# Patient Record
Sex: Female | Born: 1960
Health system: Southern US, Community
[De-identification: ages and names within clinical notes are randomized; demographics above are authoritative.]

## PROBLEM LIST (undated history)

## (undated) DIAGNOSIS — K2970 Gastritis, unspecified, without bleeding: Secondary | ICD-10-CM

## (undated) DIAGNOSIS — D649 Anemia, unspecified: Secondary | ICD-10-CM

## (undated) DIAGNOSIS — M1990 Unspecified osteoarthritis, unspecified site: Secondary | ICD-10-CM

## (undated) DIAGNOSIS — I82409 Acute embolism and thrombosis of unspecified deep veins of unspecified lower extremity: Secondary | ICD-10-CM

## (undated) DIAGNOSIS — T8859XA Other complications of anesthesia, initial encounter: Secondary | ICD-10-CM

## (undated) DIAGNOSIS — Z9889 Other specified postprocedural states: Secondary | ICD-10-CM

## (undated) DIAGNOSIS — I10 Essential (primary) hypertension: Secondary | ICD-10-CM

## (undated) DIAGNOSIS — K219 Gastro-esophageal reflux disease without esophagitis: Secondary | ICD-10-CM

## (undated) DIAGNOSIS — R112 Nausea with vomiting, unspecified: Secondary | ICD-10-CM

## (undated) DIAGNOSIS — Z8719 Personal history of other diseases of the digestive system: Secondary | ICD-10-CM

## (undated) DIAGNOSIS — R7303 Prediabetes: Secondary | ICD-10-CM

## (undated) DIAGNOSIS — R011 Cardiac murmur, unspecified: Secondary | ICD-10-CM

## (undated) DIAGNOSIS — K5792 Diverticulitis of intestine, part unspecified, without perforation or abscess without bleeding: Secondary | ICD-10-CM

## (undated) DIAGNOSIS — G4730 Sleep apnea, unspecified: Secondary | ICD-10-CM

## (undated) DIAGNOSIS — Z8489 Family history of other specified conditions: Secondary | ICD-10-CM

## (undated) HISTORY — DX: Sleep apnea, unspecified: G47.30

## (undated) HISTORY — DX: Gastro-esophageal reflux disease without esophagitis: K21.9

## (undated) HISTORY — DX: Essential (primary) hypertension: I10

## (undated) HISTORY — DX: Gastritis, unspecified, without bleeding: K29.70

## (undated) HISTORY — PX: BREAST SURGERY: SHX581

## (undated) HISTORY — PX: BREAST REDUCTION SURGERY: SHX8

## (undated) HISTORY — DX: Morbid (severe) obesity due to excess calories: E66.01

---
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
## 1991-01-13 HISTORY — PX: BUNIONECTOMY: SHX129

## 1997-12-13 ENCOUNTER — Other Ambulatory Visit: Admission: RE | Admit: 1997-12-13 | Discharge: 1997-12-13 | Payer: Self-pay | Admitting: Urology

## 2000-02-26 ENCOUNTER — Encounter: Admission: RE | Admit: 2000-02-26 | Discharge: 2000-02-26 | Payer: Self-pay | Admitting: Gastroenterology

## 2000-02-26 ENCOUNTER — Encounter: Payer: Self-pay | Admitting: Gastroenterology

## 2001-01-12 HISTORY — PX: KNEE SURGERY: SHX244

## 2001-02-11 ENCOUNTER — Observation Stay (HOSPITAL_COMMUNITY): Admission: RE | Admit: 2001-02-11 | Discharge: 2001-02-12 | Payer: Self-pay | Admitting: Internal Medicine

## 2002-05-18 ENCOUNTER — Encounter: Admission: RE | Admit: 2002-05-18 | Discharge: 2002-05-18 | Payer: Self-pay | Admitting: Gastroenterology

## 2002-05-18 ENCOUNTER — Encounter: Payer: Self-pay | Admitting: Gastroenterology

## 2002-06-23 ENCOUNTER — Observation Stay (HOSPITAL_COMMUNITY): Admission: RE | Admit: 2002-06-23 | Discharge: 2002-06-24 | Payer: Self-pay | Admitting: Surgery

## 2002-06-23 ENCOUNTER — Encounter (INDEPENDENT_AMBULATORY_CARE_PROVIDER_SITE_OTHER): Payer: Self-pay | Admitting: *Deleted

## 2003-01-13 HISTORY — PX: CHOLECYSTECTOMY: SHX55

## 2003-11-22 ENCOUNTER — Ambulatory Visit: Payer: Self-pay | Admitting: Nurse Practitioner

## 2010-05-30 NOTE — Op Note (Signed)
NAMEALEIGHA, GILANI                          ACCOUNT NO.:  [id number]   MEDICAL RECORD NO.:  [id number]                   PATIENT TYPE:  AMB   LOCATION:  DAY                                  FACILITY:  [facility]   PHYSICIAN:  Abigail Miyamoto, M.D.              DATE OF BIRTH:  January 12, 1961   DATE OF PROCEDURE:  06/23/2002  DATE OF DISCHARGE:                                 OPERATIVE REPORT   PREOPERATIVE DIAGNOSIS:  Symptomatic cholelithiasis.   POSTOPERATIVE DIAGNOSIS:  Symptomatic cholelithiasis.   SURGICAL PROCEDURE:  Laparoscopic cholecystectomy.   SURGEON:  Douglas A. Magnus Ivan, M.D.   ASSISTANT:  Donnie Coffin. Samuella Cota, M.D.   ANESTHESIA:  General endotracheal.   ESTIMATED BLOOD LOSS:  Minimal.   PROCEDURE IN DETAIL:  The patient was brought to the operating room and  identified as Bethany Kemp.  She was placed supine on the operating table,  and general anesthesia was induced.  Her abdomen was then prepped and draped  in the usual sterile fashion.  Using a #15 blade, a small transverse  incision was made below the umbilicus.  The incision was carried down  through the fascia which was then opened with a scalpel.  Hemostat was then  used to pass through the peritoneal cavity.  Next, a 0 Vicryl pursestring  suture was placed around the fascial opening.  The Hasson port was placed  through the opening, and insufflation of the abdomen was begun.  Next, an  11mm port was placed in the patient's epigastrium, and two 5 mm ports were  placed in the patient's right flank under direct vision.  The gallbladder  was then grasped and retracted above the liver bed.  Dissection was then  carried out in the base of the gallbladder.  The cystic duct was easily  dissected out.  It was then clipped three times proximally and once distally  and transected with scissors.  The cystic artery was then identified and  clipped twice proximally and once distally and transected as well.  The  gallbladder  was then easily dissected free from the liver bed with the  electrocautery.  Once the gallbladder was freed from the liver bed, it was  placed in an Endosac and removed through the incision at the umbilicus.  The  0 Vicryl at the umbilicus was tied in place, closing the fascial defect.  Again, the liver bed was examined, and hemostasis was achieved.  The abdomen  was then copiously irrigated with normal saline.  All ports were then  removed under direct vision, and the abdomen was deflated.  All incisions  were then anesthetized with 0.25% Marcaine and closed with 4-0 Monocryl  subcuticular sutures.  Steri-Strips, gauze, and tape were then applied.  The  patient tolerated the procedure well.  All sponge, needle, and instrument  counts were correct at the end of the procedure.  The patient was extubated  in  the operating room and taken in a stable condition to the recovery room.                                               Abigail Miyamoto, M.D.    DB/MEDQ  D:  06/23/2002  T:  06/23/2002  Job:  [id number]

## 2011-06-07 ENCOUNTER — Encounter: Payer: Self-pay | Admitting: *Deleted

## 2014-05-19 ENCOUNTER — Emergency Department (HOSPITAL_BASED_OUTPATIENT_CLINIC_OR_DEPARTMENT_OTHER): Payer: Federal, State, Local not specified - PPO

## 2014-05-19 ENCOUNTER — Emergency Department (HOSPITAL_BASED_OUTPATIENT_CLINIC_OR_DEPARTMENT_OTHER)
Admission: EM | Admit: 2014-05-19 | Discharge: 2014-05-19 | Disposition: A | Discharge status: Home / Self Care | Payer: Federal, State, Local not specified - PPO | Attending: Emergency Medicine | Admitting: Emergency Medicine

## 2014-05-19 ENCOUNTER — Encounter (HOSPITAL_BASED_OUTPATIENT_CLINIC_OR_DEPARTMENT_OTHER): Payer: Self-pay

## 2014-05-19 DIAGNOSIS — Z3202 Encounter for pregnancy test, result negative: Secondary | ICD-10-CM | POA: Diagnosis not present

## 2014-05-19 DIAGNOSIS — Z791 Long term (current) use of non-steroidal anti-inflammatories (NSAID): Secondary | ICD-10-CM | POA: Insufficient documentation

## 2014-05-19 DIAGNOSIS — R002 Palpitations: Secondary | ICD-10-CM | POA: Insufficient documentation

## 2014-05-19 DIAGNOSIS — R079 Chest pain, unspecified: Secondary | ICD-10-CM | POA: Diagnosis present

## 2014-05-19 DIAGNOSIS — J45909 Unspecified asthma, uncomplicated: Secondary | ICD-10-CM | POA: Diagnosis not present

## 2014-05-19 DIAGNOSIS — R011 Cardiac murmur, unspecified: Secondary | ICD-10-CM | POA: Insufficient documentation

## 2014-05-19 DIAGNOSIS — I10 Essential (primary) hypertension: Secondary | ICD-10-CM | POA: Insufficient documentation

## 2014-05-19 DIAGNOSIS — K219 Gastro-esophageal reflux disease without esophagitis: Secondary | ICD-10-CM | POA: Insufficient documentation

## 2014-05-19 DIAGNOSIS — Z7951 Long term (current) use of inhaled steroids: Secondary | ICD-10-CM | POA: Diagnosis not present

## 2014-05-19 DIAGNOSIS — Z793 Long term (current) use of hormonal contraceptives: Secondary | ICD-10-CM | POA: Diagnosis not present

## 2014-05-19 HISTORY — DX: Cardiac murmur, unspecified: R01.1

## 2014-05-19 LAB — CBC
HCT: 39.8 % (ref 36.0–46.0)
Hemoglobin: 13.2 g/dL (ref 12.0–15.0)
MCH: 28 pg (ref 26.0–34.0)
MCHC: 33.2 g/dL (ref 30.0–36.0)
MCV: 84.5 fL (ref 78.0–100.0)
PLATELETS: 330 10*3/uL (ref 150–400)
RBC: 4.71 MIL/uL (ref 3.87–5.11)
RDW: 14.6 % (ref 11.5–15.5)
WBC: 5 10*3/uL (ref 4.0–10.5)

## 2014-05-19 LAB — BASIC METABOLIC PANEL
Anion gap: 11 (ref 5–15)
BUN: 15 mg/dL (ref 6–20)
CHLORIDE: 100 mmol/L — AB (ref 101–111)
CO2: 26 mmol/L (ref 22–32)
Calcium: 9.3 mg/dL (ref 8.9–10.3)
Creatinine, Ser: 0.69 mg/dL (ref 0.44–1.00)
GFR calc Af Amer: >60 mL/min (ref >60)
GFR calc non Af Amer: >60 mL/min (ref >60)
GLUCOSE: 95 mg/dL (ref 70–99)
Potassium: 3.7 mmol/L (ref 3.5–5.1)
Sodium: 137 mmol/L (ref 135–145)

## 2014-05-19 LAB — URINALYSIS, ROUTINE W REFLEX MICROSCOPIC
BILIRUBIN URINE: NEGATIVE
Glucose, UA: NEGATIVE mg/dL
Ketones, ur: NEGATIVE mg/dL
LEUKOCYTES UA: NEGATIVE
Nitrite: NEGATIVE
Protein, ur: NEGATIVE mg/dL
SPECIFIC GRAVITY, URINE: 1.025 (ref 1.005–1.030)
Urobilinogen, UA: 0.2 mg/dL (ref 0.0–1.0)
pH: 5.5 (ref 5.0–8.0)

## 2014-05-19 LAB — URINE MICROSCOPIC-ADD ON

## 2014-05-19 LAB — D-DIMER, QUANTITATIVE (NOT AT ARMC): D-Dimer, Quant: 0.41 ug/mL-FEU (ref 0.00–0.48)

## 2014-05-19 LAB — TROPONIN I: Troponin I: <0.03 ng/mL (ref <0.031)

## 2014-05-19 LAB — PREGNANCY, URINE: Preg Test, Ur: NEGATIVE

## 2014-05-19 MED ORDER — ASPIRIN 325 MG PO TABS
325.0000 mg | ORAL_TABLET | Freq: Once | ORAL | Status: AC
  Administered 2014-05-19: 325 mg via ORAL
  Filled 2014-05-19: qty 1

## 2014-05-19 NOTE — ED Notes (Signed)
Pt reports has had some dizziness, palpitations and pain in L chest wall x 1 week, intermittent in nature.  Reports symptoms more persistent today and was driving and "everything got worse" .  Has had some nausea.  Seen at [facility] and sent here for further eval.

## 2014-05-19 NOTE — ED Provider Notes (Signed)
CSN: [id number]     Arrival date & time 05/19/14  1208 History   First MD Initiated Contact with Patient 05/19/14 1255     Chief Complaint  Patient presents with  . Chest Pain     (Consider location/radiation/quality/duration/timing/severity/associated sxs/prior Treatment) HPI  Bethany Kemp is a 54 y.o. female with PMH of heart palpitations, heart murmur, hypertension, obesity, gastritis presenting with one week of intermittent chest pain described as a "spasm" as well as heart racing. Patient states the discomfort lasted for at most 6 seconds and resolves. It occurs at rest. She notes shortness of breath, nausea as well as generalized unwell feeling during the episode that resolves. No vomiting. Patient had episode also in her primary care provider today and was referred here. Patient denies history of diabetes, smoking presenting cardiac death in family members. Pt denies history of PE, recent surgery or trauma, malignancy, hemoptysis, exogenous estrogen use, unilateral leg swelling or tenderness, immobilization. Pt with history of blood clot after surgery in 2013. She was placed on anticoagulation. None since .     Past Medical History  Diagnosis Date  . Heart palpitations   . Obesities, morbid   . Hypertension   . GERD (gastroesophageal reflux disease)   . Asthma   . Gastritis     1980's was hospitalized  . Heart murmur    Past Surgical History  Procedure Laterality Date  . Bunionectomy  1993    bilateral  . Knee surgery  2003    Righ knee -- chondal implant  . Cholecystectomy  2005  . Hernia repair  1982  . Breast reduction surgery     Family History  Problem Relation Age of Onset  . Asthma Father   . Heart attack Mother [id number]  . Arthritis Mother   . Heart disease Mother   . Diabetes Mother   . Stroke Sister   . Breast cancer Sister   . Asthma Daughter    History  Substance Use Topics  . Smoking status: Never Smoker   . Smokeless tobacco: Never Used  . Alcohol  Use: No   OB History    No data available     Review of Systems 10 Systems reviewed and are negative for acute change except as noted in the HPI.    Allergies  Aspirin  Home Medications   Prior to Admission medications   Medication Sig Start Date End Date Taking? Authorizing Provider  loratadine (CLARITIN) 10 MG tablet Take 10 mg by mouth daily.   Yes Historical Provider, MD  meloxicam (MOBIC) 15 MG tablet Take 15 mg by mouth daily.   Yes Historical Provider, MD  ranitidine (ZANTAC) 150 MG tablet Take 150 mg by mouth 2 (two) times daily.   Yes Historical Provider, MD  Albuterol (PROVENTIL IN) Inhale into the lungs as needed.    Historical Provider, MD  amitriptyline (ELAVIL) 25 MG tablet Take 75 mg by mouth at bedtime.    Historical Provider, MD  amLODipine (NORVASC) 10 MG tablet Take 10 mg by mouth daily.    Historical Provider, MD  atenolol (TENORMIN) 50 MG tablet Take 50 mg by mouth daily.    Historical Provider, MD  Fluticasone-Salmeterol (ADVAIR HFA IN) Inhale into the lungs daily.    Historical Provider, MD  losartan (COZAAR) 50 MG tablet Take 50 mg by mouth daily.    Historical Provider, MD  norethindrone (MICRONOR,CAMILA,ERRIN) 0.35 MG tablet Take 2 tablets by mouth daily.    Historical Provider, MD  omeprazole (PRILOSEC) 40 MG capsule Take 80 mg by mouth daily.    Historical Provider, MD   BP 147/92 mmHg  Pulse 94  Temp(Src) 98.5 F (36.9 C) (Oral)  Resp 24  Ht 5\' 4"  (1.626 m)  Wt 211 lb (95.709 kg)  BMI 36.20 kg/m2  SpO2 99%  LMP 04/12/2014 Physical Exam  Constitutional: She appears well-developed and well-nourished. No distress.  HENT:  Head: Normocephalic and atraumatic.  Eyes: Conjunctivae and EOM are normal. Right eye exhibits no discharge. Left eye exhibits no discharge.  Neck: No JVD present.  Cardiovascular: Normal rate and regular rhythm.   No leg swelling or tenderness. Negative Homan's sign.  Pulmonary/Chest: Effort normal and breath sounds normal.  No respiratory distress. She has no wheezes.  Abdominal: Soft. Bowel sounds are normal. She exhibits no distension. There is no tenderness.  Neurological: She is alert. She exhibits normal muscle tone. Coordination normal.  Skin: Skin is warm and dry. She is not diaphoretic.  Nursing note and vitals reviewed.   ED Course  Procedures (including critical care time) Labs Review Labs Reviewed  URINALYSIS, ROUTINE W REFLEX MICROSCOPIC - Abnormal; Notable for the following:    Hgb urine dipstick MODERATE (*)    All other components within normal limits  BASIC METABOLIC PANEL - Abnormal; Notable for the following:    Chloride 100 (*)    All other components within normal limits  PREGNANCY, URINE  CBC  TROPONIN I  D-DIMER, QUANTITATIVE  URINE MICROSCOPIC-ADD ON  TROPONIN I    Imaging Review Dg Chest 2 View  05/19/2014   CLINICAL DATA:  C/o anterior left chest pain just below breast this am with heart palpitations, hx heart murmur, non smokerShielded, denies chance of pregnancy  EXAM: CHEST  2 VIEW  COMPARISON:  None.  FINDINGS: The heart size and mediastinal contours are within normal limits. Both lungs are clear. No pleural effusion or pneumothorax. The visualized skeletal structures are unremarkable.  IMPRESSION: No active cardiopulmonary disease.   Electronically Signed   By: Amie Portlandavid  Ormond M.D.   On: 05/19/2014 13:28     EKG Interpretation   Date/Time:  Saturday May 19 2014 12:13:21 EDT Ventricular Rate:  107 PR Interval:  148 QRS Duration: 80 QT Interval:  344 QTC Calculation: 459 R Axis:   30 Text Interpretation:  Sinus tachycardia Nonspecific ST and T wave  abnormality Abnormal ECG No significant change since last tracing  Confirmed by Rhunette CroftNANAVATI, MD, Janey GentaANKIT [phone]([id number]) on 05/19/2014 1:13:59 PM      MDM   Final diagnoses:  Chest pain, unspecified chest pain type  Intermittent palpitations   Pt low risk wells with negative d-dimer low risk HEART score of 3. VSS, EKG without  acute abnormalities, negative troponin and delta troponin, and negative CXR. No pain in ED. I doubt PE, ACS. Pt has been advised to return to the ED if CP becomes exertional, associated with diaphoresis or nausea, radiates to left jaw/arm, worsens or becomes concerning in any way. Pt with cardiology follow up in one month and is to call for earlier appointment. Pt to eliminate caffeine from diet. Pt appears reliable for follow up and is agreeable to discharge.   Discussed return precautions with patient. Discussed all results and patient verbalizes understanding and agrees with plan.  Case has been discussed with Dr. Rhunette CroftNanavati who agrees with the above plan and to discharge.     Oswaldo ConroyVictoria Aura Bibby, PA-C 05/19/14 1638  Derwood KaplanAnkit Nanavati, MD 05/26/14 1515

## 2014-05-19 NOTE — Discharge Instructions (Signed)
Return to the emergency room with worsening of symptoms, new symptoms or with symptoms that are concerning , especially chest pain that feels like a pressure, spreads to left arm or jaw, worse with exertion, associated with nausea, vomiting, shortness of breath and/or sweating lightheaded going to pass out. Call to make earlier appointment with cardiology.  Please call your doctor for a followup appointment within 24-48 hours. When you talk to your doctor please let them know that you were seen in the emergency department and have them acquire all of your records so that they can discuss the findings with you and formulate a treatment plan to fully care for your new and ongoing problems.  Eliminate all caffeine from your diet. Read below information and follow recommendations.  Palpitations A palpitation is the feeling that your heartbeat is irregular or is faster than normal. It may feel like your heart is fluttering or skipping a beat. Palpitations are usually not a serious problem. However, in some cases, you may need further medical evaluation. CAUSES  Palpitations can be caused by:  Smoking.  Caffeine or other stimulants, such as diet pills or energy drinks.  Alcohol.  Stress and anxiety.  Strenuous physical activity.  Fatigue.  Certain medicines.  Heart disease, especially if you have a history of irregular heart rhythms (arrhythmias), such as atrial fibrillation, atrial flutter, or supraventricular tachycardia.  An improperly working pacemaker or defibrillator. DIAGNOSIS  To find the cause of your palpitations, your health care provider will take your medical history and perform a physical exam. Your health care provider may also have you take a test called an ambulatory electrocardiogram (ECG). An ECG records your heartbeat patterns over a 24-hour period. You may also have other tests, such as:  Transthoracic echocardiogram (TTE). During echocardiography, sound waves are used  to evaluate how blood flows through your heart.  Transesophageal echocardiogram (TEE).  Cardiac monitoring. This allows your health care provider to monitor your heart rate and rhythm in real time.  Holter monitor. This is a portable device that records your heartbeat and can help diagnose heart arrhythmias. It allows your health care provider to track your heart activity for several days, if needed.  Stress tests by exercise or by giving medicine that makes the heart beat faster. TREATMENT  Treatment of palpitations depends on the cause of your symptoms and can vary greatly. Most cases of palpitations do not require any treatment other than time, relaxation, and monitoring your symptoms. Other causes, such as atrial fibrillation, atrial flutter, or supraventricular tachycardia, usually require further treatment. HOME CARE INSTRUCTIONS   Avoid:  Caffeinated coffee, tea, soft drinks, diet pills, and energy drinks.  Chocolate.  Alcohol.  Stop smoking if you smoke.  Reduce your stress and anxiety. Things that can help you relax include:  A method of controlling things in your body, such as your heartbeats, with your mind (biofeedback).  Yoga.  Meditation.  Physical activity such as swimming, jogging, or walking.  Get plenty of rest and sleep. SEEK MEDICAL CARE IF:   You continue to have a fast or irregular heartbeat beyond 24 hours.  Your palpitations occur more often. SEEK IMMEDIATE MEDICAL CARE IF:  You have chest pain or shortness of breath.  You have a severe headache.  You feel dizzy or you faint. MAKE SURE YOU:  Understand these instructions.  Will watch your condition.  Will get help right away if you are not doing well or get worse. Document Released: 12/27/1999 Document Revised: 01/03/2013 Document  Reviewed: 02/27/2011 ExitCare Patient Information 2015 [place]. This information is not intended to replace advice given to you by your health care  provider. Make sure you discuss any questions you have with your health care provider.

## 2014-06-22 ENCOUNTER — Ambulatory Visit (INDEPENDENT_AMBULATORY_CARE_PROVIDER_SITE_OTHER): Payer: Federal, State, Local not specified - PPO | Admitting: Cardiology

## 2014-06-22 ENCOUNTER — Ambulatory Visit: Payer: Federal, State, Local not specified - PPO

## 2014-06-22 ENCOUNTER — Encounter: Payer: Self-pay | Admitting: Cardiology

## 2014-06-22 VITALS — BP 118/80 | HR 74 | Ht 64.0 in | Wt 216.0 lb

## 2014-06-22 DIAGNOSIS — R0600 Dyspnea, unspecified: Secondary | ICD-10-CM | POA: Diagnosis not present

## 2014-06-22 DIAGNOSIS — R0602 Shortness of breath: Secondary | ICD-10-CM | POA: Diagnosis not present

## 2014-06-22 DIAGNOSIS — R002 Palpitations: Secondary | ICD-10-CM | POA: Diagnosis not present

## 2014-06-22 NOTE — Progress Notes (Signed)
Cardiology Office Note   Date:  06/22/2014   ID:  Bethany Kemp, DOB October 19, 1960, MRN [id number]  PCP:  Pcp Not In System  Cardiologist:   Rollene Rotunda, MD   Chief Complaint  Patient presents with  . Palpitations      History of Present Illness: Bethany Kemp is a 54 y.o. female who presents for patient presents for evaluation of palpitations. The patient has no past cardiac history.  However, she didn't have any palpitations. This has been for about 6 weeks. She was in the emergency room. She would feel her heart fluttering. She might have several of these in a row. Seems to be happening every 3 or 4 days. She went to the emergency room and I reviewed these records but there was no evidence of any significant dysrhythmia.  She did not have presyncope or syncope. She does get some shortness of breath with activities but she denies any chest pressure, neck or arm discomfort. She's not had any PND or orthopnea. She's had no weight gain or edema. In the emergency room the labs were normal. She did not have any thyroid studies but her electrolytes were normal.   Past Medical History  Diagnosis Date  . Obesities, morbid   . Hypertension   . GERD (gastroesophageal reflux disease)   . Asthma   . Gastritis     1980's was hospitalized  . Heart murmur   . Sleep apnea     BiPap    Past Surgical History  Procedure Laterality Date  . Bunionectomy  1993    bilateral  . Knee surgery  2003    Righ knee -- chondal implant  . Cholecystectomy  2005  . Hernia repair  1982  . Breast reduction surgery       Current Outpatient Prescriptions  Medication Sig Dispense Refill  . Albuterol (PROVENTIL IN) Inhale into the lungs as needed.    Marland Kitchen amitriptyline (ELAVIL) 25 MG tablet Take 75 mg by mouth at bedtime.    Marland Kitchen amLODipine (NORVASC) 10 MG tablet Take 10 mg by mouth daily.    Marland Kitchen atenolol (TENORMIN) 50 MG tablet Take 50 mg by mouth daily.    . Cholecalciferol 1000 UNITS tablet Take 1,000  Units by mouth daily.    . Fluticasone-Salmeterol (ADVAIR HFA IN) Inhale into the lungs daily.    . hydrochlorothiazide (HYDRODIURIL) 25 MG tablet Take 25 mg by mouth daily. Take 1/2 tablet daily    . loratadine (CLARITIN) 10 MG tablet Take 10 mg by mouth daily.    Marland Kitchen losartan (COZAAR) 50 MG tablet Take 50 mg by mouth daily.    . meloxicam (MOBIC) 15 MG tablet Take 15 mg by mouth daily.    . Mometasone Furoate 200 MCG/ACT AERO Inhale 220 mcg into the lungs.    Marland Kitchen omeprazole (PRILOSEC) 40 MG capsule Take 80 mg by mouth daily.    . ranitidine (ZANTAC) 150 MG tablet Take 150 mg by mouth 2 (two) times daily.     No current facility-administered medications for this visit.    Allergies:   Acetaminophen and Aspirin    Social History:  The patient  reports that she has never smoked. She has never used smokeless tobacco. She reports that she does not drink alcohol or use illicit drugs.   Family History:  The patient's family history includes Arthritis in her mother; Asthma in her daughter and father; Breast cancer in her sister; Diabetes in her mother; Heart attack (  age of onset: 44) in her mother; Stroke in her sister.    ROS:  Please see the history of present illness.   Otherwise, review of systems are positive for none.   All other systems are reviewed and negative.    PHYSICAL EXAM: VS:  BP 118/80 mmHg  Pulse 74  Ht 5\' 4"  (1.626 m)  Wt 216 lb (97.977 kg)  BMI 37.06 kg/m2 , BMI Body mass index is 37.06 kg/(m^2). GENERAL:  Well appearing HEENT:  Pupils equal round and reactive, fundi not visualized, oral mucosa unremarkable NECK:  No jugular venous distention, waveform within normal limits, carotid upstroke brisk and symmetric, no bruits, no thyromegaly LYMPHATICS:  No cervical, inguinal adenopathy LUNGS:  Clear to auscultation bilaterally BACK:  No CVA tenderness CHEST:  Unremarkable HEART:  PMI not displaced or sustained,S1 and S2 within normal limits, no S3, no S4, no clicks, no  rubs, no murmurs ABD:  Flat, positive bowel sounds normal in frequency in pitch, no bruits, no rebound, no guarding, no midline pulsatile mass, no hepatomegaly, no splenomegaly EXT:  2 plus pulses throughout, no edema, no cyanosis no clubbing SKIN:  No rashes no nodules NEURO:  Cranial nerves II through XII grossly intact, motor grossly intact throughout PSYCH:  Cognitively intact, oriented to person place and time    EKG:  EKG is ordered today. The ekg ordered today demonstrates sinus rhythm, rate 74, axis within normal limits, RSR prime V1 and V2, poor anterior R wave progression, nonspecific diffuse T wave flattening. Left atrial enlargement probable.   Recent Labs: 05/19/2014: BUN 15; Creatinine, Ser 0.69; Hemoglobin 13.2; Platelets 330; Potassium 3.7; Sodium 137    Lipid Panel No results found for: CHOL, TRIG, HDL, CHOLHDL, VLDL, LDLCALC, LDLDIRECT    Wt Readings from Last 3 Encounters:  06/22/14 216 lb (97.977 kg)  05/19/14 211 lb (95.709 kg)  05/27/09 227 lb (102.967 kg)      Other studies Reviewed: Additional studies/ records that were reviewed today include: ED records. Review of the above records demonstrates:  Please see elsewhere in the note.     ASSESSMENT AND PLAN:  PALPITATIONS:  [name] will need a 21 day event monitor.  The patients symptoms necessitate an event monitor.  The symptoms are too infrequent to be identified on a Holter monitor.  Further evaluation will be based on these results. I will check a TSH.  DOE: The patient has a significant family history with her mom dying suddenly at age 31. I will start with a POET (Plain Old Exercise Treadmill).  HTN:  The blood pressure is at target. No change in medications is indicated. We will continue with therapeutic lifestyle changes (TLC).  OVERWEIGHT:  The patient understands the need to lose weight with diet and exercise. We have discussed specific strategies for this.   Current medicines are  reviewed at length with the patient today.  The patient does not have concerns regarding medicines.  The following changes have been made:  no change  Labs/ tests ordered today include:   Orders Placed This Encounter  Procedures  . TSH  . Cardiac event monitor  . Exercise Tolerance Test  . EKG 12-Lead     Disposition:   FU with me in 2 months.     Signed, Rollene Rotunda, MD  06/22/2014 3:29 PM    [facility]

## 2014-06-22 NOTE — Patient Instructions (Signed)
Your physician recommends that you schedule a follow-up appointment in: 2 Months  Your physician has recommended that you wear an event monitor. Event monitors are medical devices that record the heart's electrical activity. Doctors most often [place] these monitors to diagnose arrhythmias. Arrhythmias are problems with the speed or rhythm of the heartbeat. The monitor is a small, portable device. You can wear one while you do your normal daily activities. This is usually used to diagnose what is causing palpitations/syncope (passing out).  Your physician has requested that you have an exercise tolerance test. For further information please visit [email or web address] Please also follow instruction sheet, as given.  Your physician recommends that you return for lab work TSH

## 2014-06-23 LAB — TSH: TSH: 0.919 u[IU]/mL (ref 0.350–4.500)

## 2014-07-20 ENCOUNTER — Telehealth (HOSPITAL_COMMUNITY): Payer: Self-pay

## 2014-07-20 NOTE — Telephone Encounter (Signed)
Encounter complete. 

## 2014-07-25 ENCOUNTER — Ambulatory Visit (INDEPENDENT_AMBULATORY_CARE_PROVIDER_SITE_OTHER): Payer: Federal, State, Local not specified - PPO | Admitting: Cardiology

## 2014-07-25 ENCOUNTER — Ambulatory Visit (HOSPITAL_COMMUNITY)
Admission: RE | Admit: 2014-07-25 | Discharge: 2014-07-25 | Disposition: A | Discharge status: Home / Self Care | Payer: Federal, State, Local not specified - PPO | Source: Ambulatory Visit | Attending: Cardiology | Admitting: Cardiology

## 2014-07-25 ENCOUNTER — Encounter (HOSPITAL_COMMUNITY): Payer: Self-pay | Admitting: *Deleted

## 2014-07-25 ENCOUNTER — Encounter: Payer: Self-pay | Admitting: Cardiology

## 2014-07-25 VITALS — BP 124/84 | HR 134 | Ht 64.0 in | Wt 217.0 lb

## 2014-07-25 DIAGNOSIS — D689 Coagulation defect, unspecified: Secondary | ICD-10-CM

## 2014-07-25 DIAGNOSIS — R9439 Abnormal result of other cardiovascular function study: Secondary | ICD-10-CM

## 2014-07-25 DIAGNOSIS — R0602 Shortness of breath: Secondary | ICD-10-CM

## 2014-07-25 DIAGNOSIS — Z01818 Encounter for other preprocedural examination: Secondary | ICD-10-CM | POA: Diagnosis not present

## 2014-07-25 LAB — EXERCISE TOLERANCE TEST
CHL RATE OF PERCEIVED EXERTION: 15
CSEPEW: 8.5 METS
Exercise duration (min): 7 min
MPHR: 168 {beats}/min
Peak HR: 196 {beats}/min
Percent HR: 116 %
Rest HR: 117 {beats}/min

## 2014-07-25 MED ORDER — ATENOLOL 25 MG PO TABS: 25.0000 mg | ORAL_TABLET | ORAL | Status: DC

## 2014-07-25 NOTE — Progress Notes (Signed)
  Cardiology Office Note   Date:  07/25/2014   ID:  [name], DOB 06/23/1960, MRN [id number]  PCP:  Pcp Not In System  Cardiologist:   Safwan Tomei, MD   Chief Complaint  Patient presents with  . Abnormal ETT      History of Present Illness: Bethany Kemp is a 54 y.o. female who presents for patient presents for evaluation of palpitations.  This is described in the previous note. She was also describing shortness of breath and had risk factors with his family history of her mother having a heart attack in her mid 60s. Because of this I sent her for a treadmill test today. This demonstrated significant ST depression exercise the inferior and lateral leads. This was about 4 mm. It did improve. She has dyspnea. She did not have chest discomfort, neck or arm discomfort. This was a positive test for evidence of ischemia.  The patient otherwise reports that her palpitations are a little less noticeable but still present. She's not had any presyncope or syncope. Of note she did wear an event monitor with results as below.  Past Medical History  Diagnosis Date  . Obesities, morbid   . Hypertension   . GERD (gastroesophageal reflux disease)   . Asthma   . Gastritis     1980's was hospitalized  . Heart murmur   . Sleep apnea     BiPap    Past Surgical History  Procedure Laterality Date  . Bunionectomy  1993    bilateral  . Knee surgery  2003    Righ knee -- chondal implant  . Cholecystectomy  2005  . Hernia repair  1982  . Breast reduction surgery       Current Outpatient Prescriptions  Medication Sig Dispense Refill  . Albuterol (PROVENTIL IN) Inhale into the lungs as needed.    . amitriptyline (ELAVIL) 25 MG tablet Take 75 mg by mouth at bedtime.    . amLODipine (NORVASC) 10 MG tablet Take 10 mg by mouth daily.    . atenolol (TENORMIN) 50 MG tablet Take 50 mg by mouth daily.    . Cholecalciferol 1000 UNITS tablet Take 1,000 Units by mouth daily.    .  Fluticasone-Salmeterol (ADVAIR HFA IN) Inhale into the lungs daily.    . hydrochlorothiazide (HYDRODIURIL) 25 MG tablet Take 1/2 tablet daily    . loratadine (CLARITIN) 10 MG tablet Take 10 mg by mouth daily.    . losartan (COZAAR) 50 MG tablet Take 50 mg by mouth daily.    . meloxicam (MOBIC) 15 MG tablet Take 15 mg by mouth daily.    . Mometasone Furoate 200 MCG/ACT AERO Inhale 220 mcg into the lungs.    . omeprazole (PRILOSEC) 40 MG capsule Take 80 mg by mouth daily.     No current facility-administered medications for this visit.    Allergies:   Acetaminophen and Aspirin    Social History:  The patient  reports that she has never smoked. She has never used smokeless tobacco. She reports that she does not drink alcohol or use illicit drugs.   Family History:  The patient's family history includes Arthritis in her mother; Asthma in her daughter and father; Breast cancer in her sister; Diabetes in her mother; Heart attack (age of onset: 65) in her mother; Stroke in her sister.    ROS:  Please see the history of present illness.   Otherwise, review of systems are positive for none.     All other systems are reviewed and negative.    PHYSICAL EXAM: VS:  BP 124/84 mmHg  Pulse 134  Ht 5\' 4"  (1.626 m)  Wt 217 lb (98.431 kg)  BMI 37.23 kg/m2 , BMI Body mass index is 37.23 kg/(m^2). GENERAL:  Well appearing HEENT:  Pupils equal round and reactive, fundi not visualized, oral mucosa unremarkable NECK:  No jugular venous distention, waveform within normal limits, carotid upstroke brisk and symmetric, no bruits, no thyromegaly LYMPHATICS:  No cervical, inguinal adenopathy LUNGS:  Clear to auscultation bilaterally BACK:  No CVA tenderness CHEST:  Unremarkable HEART:  PMI not displaced or sustained,S1 and S2 within normal limits, no S3, no S4, no clicks, no rubs, no murmurs ABD:  Flat, positive bowel sounds normal in frequency in pitch, no bruits, no rebound, no guarding, no midline pulsatile  mass, no hepatomegaly, no splenomegaly EXT:  2 plus pulses throughout, no edema, no cyanosis no clubbing SKIN:  No rashes no nodules NEURO:  Cranial nerves II through XII grossly intact, motor grossly intact throughout PSYCH:  Cognitively intact, oriented to person place and time    EKG:  EKG is not ordered today.   Recent Labs: 05/19/2014: BUN 15; Creatinine, Ser 0.69; Hemoglobin 13.2; Platelets 330; Potassium 3.7; Sodium 137 06/22/2014: TSH 0.919    Lipid Panel No results found for: CHOL, TRIG, HDL, CHOLHDL, VLDL, LDLCALC, LDLDIRECT    Wt Readings from Last 3 Encounters:  07/25/14 217 lb (98.431 kg)  06/22/14 216 lb (97.977 kg)  05/19/14 211 lb (95.709 kg)      Other studies Reviewed: Additional studies/ records that were reviewed today include:  POET (Plain Old Exercise Treadmill)   ASSESSMENT AND PLAN:  ABNORMAL POET (Plain Old Exercise Treadmill):  Given this result she needs to have cardiac cath to rule out obstructive disease.  He did have dyspnea as an anginal equivalent which was relatively new onset and so I would consider this class III.  The patient understands that risks included but are not limited to stroke (1 in 1000), death (1 in 1000), kidney failure [usually temporary] (1 in 500), bleeding (1 in 200), allergic reaction [possibly serious] (1 in 200).  The patient understands and agrees to proceed.   She will start aspirin.   PALPITATIONS:  I reviewed with her the results of her event monitor which demonstrated some atrial tachycardia and some wide-complex rhythm that I thought was probably aberrancy. I will increase her atenolol by adding 25 mg in the morning to go along with her previous dose. She will let she feels with this.  DOE: This is being evaluated as above.    HTN:  The blood pressure is at target. No change in medications is indicated. We will continue with therapeutic lifestyle changes (TLC).  OVERWEIGHT:  The patient understands the need to lose  weight with diet and exercise. We have discussed specific strategies for this.   Current medicines are reviewed at length with the patient today.  The patient does not have concerns regarding medicines.  The following changes have been made:  Change as above  Labs/ tests ordered today include:   Orders Placed This Encounter  Procedures  . CBC  . COMPLETE METABOLIC PANEL WITH GFR  . TSH  . Protime-INR  . PTT  . LEFT HEART CATHETERIZATION WITH CORONARY ANGIOGRAM     Disposition:   FU with me in 2 months.     Signed, Rollene RotundaJames Lariyah Shetterly, MD  07/25/2014 4:52 PM    [facility]

## 2014-07-25 NOTE — Patient Instructions (Signed)
Your physician has requested that you have a cardiac catheterization. Cardiac catheterization is used to diagnose and/or treat various heart conditions. Doctors may recommend this procedure for a number of different reasons. The most common reason is to evaluate chest pain. Chest pain can be a symptom of coronary artery disease (CAD), and cardiac catheterization can show whether plaque is narrowing or blocking your heart's arteries. This procedure is also used to evaluate the valves, as well as measure the blood flow and oxygen levels in different parts of your heart. For further information please visit [email or web address]. Please follow instruction sheet, as given.  Your physician recommends that you return for lab work   Your physician has recommended you make the following change in your medication: TAKE an Extra 25 mg Atenolol in the morning and START an 81 mg Baby Aspirin

## 2014-07-25 NOTE — Progress Notes (Unsigned)
ETT:  Showed to Dr. Herbie BaltimoreHarding, he took to Dr. Antoine PocheHochrein, and Dr. Antoine PocheHochrein wanted to see Bethany Kemp.  The Bethany Kemp was taken around and put into an exam room. 4:10 pm

## 2014-07-26 ENCOUNTER — Encounter: Payer: Self-pay | Admitting: Cardiovascular Disease

## 2014-07-26 ENCOUNTER — Other Ambulatory Visit: Payer: Self-pay | Admitting: *Deleted

## 2014-07-26 DIAGNOSIS — Z01818 Encounter for other preprocedural examination: Secondary | ICD-10-CM

## 2014-07-27 LAB — COMPLETE METABOLIC PANEL WITH GFR
ALBUMIN: 3.6 g/dL (ref 3.5–5.2)
ALT: 9 U/L (ref 0–35)
AST: 12 U/L (ref 0–37)
Alkaline Phosphatase: 68 U/L (ref 39–117)
BUN: 14 mg/dL (ref 6–23)
CALCIUM: 8.9 mg/dL (ref 8.4–10.5)
CHLORIDE: 102 meq/L (ref 96–112)
CO2: 30 mEq/L (ref 19–32)
CREATININE: 0.63 mg/dL (ref 0.50–1.10)
GFR, Est Non African American: >89 mL/min
Glucose, Bld: 78 mg/dL (ref 70–99)
POTASSIUM: 4.6 meq/L (ref 3.5–5.3)
Sodium: 140 mEq/L (ref 135–145)
TOTAL PROTEIN: 6.5 g/dL (ref 6.0–8.3)
Total Bilirubin: 0.3 mg/dL (ref 0.2–1.2)

## 2014-07-27 LAB — CBC
HEMATOCRIT: 35 % — AB (ref 36.0–46.0)
Hemoglobin: 11.1 g/dL — ABNORMAL LOW (ref 12.0–15.0)
MCH: 27.2 pg (ref 26.0–34.0)
MCHC: 31.7 g/dL (ref 30.0–36.0)
MCV: 85.8 fL (ref 78.0–100.0)
MPV: 9.3 fL (ref 8.6–12.4)
Platelets: 373 10*3/uL (ref 150–400)
RBC: 4.08 MIL/uL (ref 3.87–5.11)
RDW: 14.4 % (ref 11.5–15.5)
WBC: 5.9 10*3/uL (ref 4.0–10.5)

## 2014-07-27 LAB — APTT: APTT: 40 s — AB (ref 24–37)

## 2014-07-27 LAB — PROTIME-INR
INR: 1.05 (ref <1.50)
Prothrombin Time: 13.7 seconds (ref 11.6–15.2)

## 2014-07-27 LAB — TSH: TSH: 1.339 u[IU]/mL (ref 0.350–4.500)

## 2014-07-30 ENCOUNTER — Ambulatory Visit (HOSPITAL_COMMUNITY)
Admission: RE | Admit: 2014-07-30 | Discharge: 2014-07-30 | Disposition: A | Discharge status: Home / Self Care | Payer: Federal, State, Local not specified - PPO | Source: Ambulatory Visit | Attending: Cardiovascular Disease | Admitting: Cardiovascular Disease

## 2014-07-30 ENCOUNTER — Encounter (HOSPITAL_COMMUNITY)
Admission: RE | Disposition: A | Discharge status: Home / Self Care | Payer: Self-pay | Source: Ambulatory Visit | Attending: Cardiovascular Disease

## 2014-07-30 ENCOUNTER — Encounter (HOSPITAL_COMMUNITY): Payer: Self-pay | Admitting: Cardiovascular Disease

## 2014-07-30 DIAGNOSIS — Z8249 Family history of ischemic heart disease and other diseases of the circulatory system: Secondary | ICD-10-CM | POA: Diagnosis not present

## 2014-07-30 DIAGNOSIS — K219 Gastro-esophageal reflux disease without esophagitis: Secondary | ICD-10-CM | POA: Insufficient documentation

## 2014-07-30 DIAGNOSIS — Z6837 Body mass index (BMI) 37.0-37.9, adult: Secondary | ICD-10-CM | POA: Insufficient documentation

## 2014-07-30 DIAGNOSIS — R002 Palpitations: Secondary | ICD-10-CM | POA: Diagnosis not present

## 2014-07-30 DIAGNOSIS — G4730 Sleep apnea, unspecified: Secondary | ICD-10-CM | POA: Insufficient documentation

## 2014-07-30 DIAGNOSIS — R9439 Abnormal result of other cardiovascular function study: Secondary | ICD-10-CM | POA: Insufficient documentation

## 2014-07-30 DIAGNOSIS — J45909 Unspecified asthma, uncomplicated: Secondary | ICD-10-CM | POA: Diagnosis not present

## 2014-07-30 DIAGNOSIS — Z01818 Encounter for other preprocedural examination: Secondary | ICD-10-CM

## 2014-07-30 DIAGNOSIS — I10 Essential (primary) hypertension: Secondary | ICD-10-CM | POA: Insufficient documentation

## 2014-07-30 HISTORY — PX: CARDIAC CATHETERIZATION: SHX172

## 2014-07-30 SURGERY — LEFT HEART CATH AND CORONARY ANGIOGRAPHY
Anesthesia: LOCAL

## 2014-07-30 MED ORDER — SODIUM CHLORIDE 0.9 % IV SOLN: 250.0000 mL | INTRAVENOUS | Status: DC | PRN

## 2014-07-30 MED ORDER — SODIUM CHLORIDE 0.9 % IJ SOLN: 3.0000 mL | Freq: Two times a day (BID) | INTRAMUSCULAR | Status: DC

## 2014-07-30 MED ORDER — MIDAZOLAM HCL 2 MG/2ML IJ SOLN
INTRAMUSCULAR | Status: AC
  Filled 2014-07-30: qty 2

## 2014-07-30 MED ORDER — SODIUM CHLORIDE 0.9 % IV SOLN: INTRAVENOUS | Status: DC

## 2014-07-30 MED ORDER — ONDANSETRON HCL 4 MG/2ML IJ SOLN: 4.0000 mg | Freq: Four times a day (QID) | INTRAMUSCULAR | Status: DC | PRN

## 2014-07-30 MED ORDER — LIDOCAINE HCL (PF) 1 % IJ SOLN
INTRAMUSCULAR | Status: AC
  Filled 2014-07-30: qty 30

## 2014-07-30 MED ORDER — ACETAMINOPHEN 325 MG PO TABS: 650.0000 mg | ORAL_TABLET | ORAL | Status: DC | PRN

## 2014-07-30 MED ORDER — SODIUM CHLORIDE 0.9 % IJ SOLN: 3.0000 mL | INTRAMUSCULAR | Status: DC | PRN

## 2014-07-30 MED ORDER — HEPARIN (PORCINE) IN NACL 2-0.9 UNIT/ML-% IJ SOLN
INTRAMUSCULAR | Status: AC
  Filled 2014-07-30: qty 1000

## 2014-07-30 MED ORDER — HEPARIN SODIUM (PORCINE) 1000 UNIT/ML IJ SOLN
INTRAMUSCULAR | Status: DC | PRN
  Administered 2014-07-30: 4000 [IU] via INTRAVENOUS

## 2014-07-30 MED ORDER — IOHEXOL 300 MG/ML  SOLN
INTRAMUSCULAR | Status: DC | PRN
  Administered 2014-07-30: 80 mL via INTRA_ARTERIAL

## 2014-07-30 MED ORDER — FENTANYL CITRATE (PF) 100 MCG/2ML IJ SOLN
INTRAMUSCULAR | Status: AC
  Filled 2014-07-30: qty 2

## 2014-07-30 MED ORDER — LIDOCAINE HCL (PF) 1 % IJ SOLN
INTRAMUSCULAR | Status: DC | PRN
  Administered 2014-07-30: 10:00:00

## 2014-07-30 MED ORDER — VERAPAMIL HCL 2.5 MG/ML IV SOLN
INTRAVENOUS | Status: DC | PRN
  Administered 2014-07-30: 10:00:00 via INTRA_ARTERIAL

## 2014-07-30 MED ORDER — SODIUM CHLORIDE 0.9 % IV SOLN
INTRAVENOUS | Status: DC
  Administered 2014-07-30: 08:00:00 via INTRAVENOUS

## 2014-07-30 MED ORDER — FENTANYL CITRATE (PF) 100 MCG/2ML IJ SOLN
INTRAMUSCULAR | Status: DC | PRN
  Administered 2014-07-30 (×2): 25 ug via INTRAVENOUS

## 2014-07-30 MED ORDER — ASPIRIN 81 MG PO CHEW: 81.0000 mg | CHEWABLE_TABLET | ORAL | Status: DC

## 2014-07-30 MED ORDER — MIDAZOLAM HCL 2 MG/2ML IJ SOLN
INTRAMUSCULAR | Status: DC | PRN
  Administered 2014-07-30: 1 mg via INTRAVENOUS
  Administered 2014-07-30: 2 mg via INTRAVENOUS

## 2014-07-30 MED ORDER — HEPARIN SODIUM (PORCINE) 1000 UNIT/ML IJ SOLN
INTRAMUSCULAR | Status: AC
  Filled 2014-07-30: qty 1

## 2014-07-30 SURGICAL SUPPLY — 11 items

## 2014-07-30 NOTE — Discharge Instructions (Signed)
Radial Site Care °Refer to this sheet in the next few weeks. These instructions provide you with information on caring for yourself after your procedure. Your caregiver may also give you more specific instructions. Your treatment has been planned according to current medical practices, but problems sometimes occur. Call your caregiver if you have any problems or questions after your procedure. °HOME CARE INSTRUCTIONS °· You may shower the day after the procedure. Remove the bandage (dressing) and gently wash the site with plain soap and water. Gently pat the site dry. °· Do not apply powder or lotion to the site. °· Do not submerge the affected site in water for 3 to 5 days. °· Inspect the site at least twice daily. °· Do not flex or bend the affected arm for 24 hours. °· No lifting over 5 pounds (2.3 kg) for 5 days after your procedure. °· Do not drive home if you are discharged the same day of the procedure. Have someone else drive you. °· You may drive 24 hours after the procedure unless otherwise instructed by your caregiver. °· Do not operate machinery or power tools for 24 hours. °· A responsible adult should be with you for the first 24 hours after you arrive home. °What to expect: °· Any bruising will usually fade within 1 to 2 weeks. °· Blood that collects in the tissue (hematoma) may be painful to the touch. It should usually decrease in size and tenderness within 1 to 2 weeks. °SEEK IMMEDIATE MEDICAL CARE IF: °· You have unusual pain at the radial site. °· You have redness, warmth, swelling, or pain at the radial site. °· You have drainage (other than a small amount of blood on the dressing). °· You have chills. °· You have a fever or persistent symptoms for more than 72 hours. °· You have a fever and your symptoms suddenly get worse. °· Your arm becomes pale, cool, tingly, or numb. °· You have heavy bleeding from the site. Hold pressure on the site. °Document Released: 01/31/2010 Document Revised:  03/23/2011 Document Reviewed: 01/31/2010 °ExitCare® Patient Information ©2015 [employer]. This information is not intended to replace advice given to you by your health care provider. Make sure you discuss any questions you have with your health care provider. ° °

## 2014-07-30 NOTE — Progress Notes (Signed)
TR BAND REMOVAL  LOCATION:    right radial  DEFLATED PER PROTOCOL:    Yes.    TIME BAND OFF / DRESSING APPLIED:    1230   SITE UPON ARRIVAL:    Level 0  SITE AFTER BAND REMOVAL:    Level 0  CIRCULATION SENSATION AND MOVEMENT:    Within Normal Limits   Yes.    COMMENTS:   TRB REMOVED/ TEGADERM DSG APPLIED 

## 2014-07-30 NOTE — Research (Signed)
CADLAD Informed Consent   Subject Name: Bethany Kemp  Subject met inclusion and exclusion criteria.  The informed consent form, study requirements and expectations were reviewed with the subject and questions and concerns were addressed prior to the signing of the consent form.  The subject verbalized understanding of the trail requirements.  The subject agreed to participate in the CADLAD trial and signed the informed consent.  The informed consent was obtained prior to performance of any protocol-specific procedures for the subject.  A copy of the signed informed consent was given to the subject and a copy was placed in the subject's medical record.  Sandie Ano 07/30/2014, 8:00

## 2014-07-30 NOTE — Interval H&P Note (Signed)
Cath Lab Visit (complete for each Cath Lab visit)  Clinical Evaluation Leading to the Procedure:   ACS: No.  Non-ACS:    Anginal Classification: CCS III  Anti-ischemic medical therapy: Maximal Therapy (2 or more classes of medications)  Non-Invasive Test Results: Intermediate-risk stress test findings: cardiac mortality 1-3%/year  Prior CABG: No previous CABG      History and Physical Interval Note:  07/30/2014 9:34 AM  Bethany Kemp  has presented today for surgery, with the diagnosis of cp, abnormal stress test  The various methods of treatment have been discussed with the patient and family. After consideration of risks, benefits and other options for treatment, the patient has consented to  Procedure(s): Left Heart Cath and Coronary Angiography (N/A) as a surgical intervention .  The patient's history has been reviewed, patient examined, no change in status, stable for surgery.  I have reviewed the patient's chart and labs.  Questions were answered to the patient's satisfaction.     Tonny Bollmanooper, Jahzeel Poythress

## 2014-07-30 NOTE — H&P (View-Only) (Signed)
Cardiology Office Note   Date:  07/25/2014   ID:  Bethany Kemp, DOB 1960-07-08, MRN [id number]  PCP:  Pcp Not In System  Cardiologist:   Rollene Rotunda, MD   Chief Complaint  Patient presents with  . Abnormal ETT      History of Present Illness: Bethany Kemp is a 54 y.o. female who presents for patient presents for evaluation of palpitations.  This is described in the previous note. She was also describing shortness of breath and had risk factors with his family history of her mother having a heart attack in her mid 24s. Because of this I sent her for a treadmill test today. This demonstrated significant ST depression exercise the inferior and lateral leads. This was about 4 mm. It did improve. She has dyspnea. She did not have chest discomfort, neck or arm discomfort. This was a positive test for evidence of ischemia.  The patient otherwise reports that her palpitations are a little less noticeable but still present. She's not had any presyncope or syncope. Of note she did wear an event monitor with results as below.  Past Medical History  Diagnosis Date  . Obesities, morbid   . Hypertension   . GERD (gastroesophageal reflux disease)   . Asthma   . Gastritis     1980's was hospitalized  . Heart murmur   . Sleep apnea     BiPap    Past Surgical History  Procedure Laterality Date  . Bunionectomy  1993    bilateral  . Knee surgery  2003    Righ knee -- chondal implant  . Cholecystectomy  2005  . Hernia repair  1982  . Breast reduction surgery       Current Outpatient Prescriptions  Medication Sig Dispense Refill  . Albuterol (PROVENTIL IN) Inhale into the lungs as needed.    Marland Kitchen amitriptyline (ELAVIL) 25 MG tablet Take 75 mg by mouth at bedtime.    Marland Kitchen amLODipine (NORVASC) 10 MG tablet Take 10 mg by mouth daily.    Marland Kitchen atenolol (TENORMIN) 50 MG tablet Take 50 mg by mouth daily.    . Cholecalciferol 1000 UNITS tablet Take 1,000 Units by mouth daily.    .  Fluticasone-Salmeterol (ADVAIR HFA IN) Inhale into the lungs daily.    . hydrochlorothiazide (HYDRODIURIL) 25 MG tablet Take 1/2 tablet daily    . loratadine (CLARITIN) 10 MG tablet Take 10 mg by mouth daily.    Marland Kitchen losartan (COZAAR) 50 MG tablet Take 50 mg by mouth daily.    . meloxicam (MOBIC) 15 MG tablet Take 15 mg by mouth daily.    . Mometasone Furoate 200 MCG/ACT AERO Inhale 220 mcg into the lungs.    Marland Kitchen omeprazole (PRILOSEC) 40 MG capsule Take 80 mg by mouth daily.     No current facility-administered medications for this visit.    Allergies:   Acetaminophen and Aspirin    Social History:  The patient  reports that she has never smoked. She has never used smokeless tobacco. She reports that she does not drink alcohol or use illicit drugs.   Family History:  The patient's family history includes Arthritis in her mother; Asthma in her daughter and father; Breast cancer in her sister; Diabetes in her mother; Heart attack (age of onset: 44) in her mother; Stroke in her sister.    ROS:  Please see the history of present illness.   Otherwise, review of systems are positive for none.  All other systems are reviewed and negative.    PHYSICAL EXAM: VS:  BP 124/84 mmHg  Pulse 134  Ht 5\' 4"  (1.626 m)  Wt 217 lb (98.431 kg)  BMI 37.23 kg/m2 , BMI Body mass index is 37.23 kg/(m^2). GENERAL:  Well appearing HEENT:  Pupils equal round and reactive, fundi not visualized, oral mucosa unremarkable NECK:  No jugular venous distention, waveform within normal limits, carotid upstroke brisk and symmetric, no bruits, no thyromegaly LYMPHATICS:  No cervical, inguinal adenopathy LUNGS:  Clear to auscultation bilaterally BACK:  No CVA tenderness CHEST:  Unremarkable HEART:  PMI not displaced or sustained,S1 and S2 within normal limits, no S3, no S4, no clicks, no rubs, no murmurs ABD:  Flat, positive bowel sounds normal in frequency in pitch, no bruits, no rebound, no guarding, no midline pulsatile  mass, no hepatomegaly, no splenomegaly EXT:  2 plus pulses throughout, no edema, no cyanosis no clubbing SKIN:  No rashes no nodules NEURO:  Cranial nerves II through XII grossly intact, motor grossly intact throughout PSYCH:  Cognitively intact, oriented to person place and time    EKG:  EKG is not ordered today.   Recent Labs: 05/19/2014: BUN 15; Creatinine, Ser 0.69; Hemoglobin 13.2; Platelets 330; Potassium 3.7; Sodium 137 06/22/2014: TSH 0.919    Lipid Panel No results found for: CHOL, TRIG, HDL, CHOLHDL, VLDL, LDLCALC, LDLDIRECT    Wt Readings from Last 3 Encounters:  07/25/14 217 lb (98.431 kg)  06/22/14 216 lb (97.977 kg)  05/19/14 211 lb (95.709 kg)      Other studies Reviewed: Additional studies/ records that were reviewed today include:  POET (Plain Old Exercise Treadmill)   ASSESSMENT AND PLAN:  ABNORMAL POET (Plain Old Exercise Treadmill):  Given this result she needs to have cardiac cath to rule out obstructive disease.  He did have dyspnea as an anginal equivalent which was relatively new onset and so I would consider this class III.  The patient understands that risks included but are not limited to stroke (1 in 1000), death (1 in 1000), kidney failure [usually temporary] (1 in 500), bleeding (1 in 200), allergic reaction [possibly serious] (1 in 200).  The patient understands and agrees to proceed.   She will start aspirin.   PALPITATIONS:  I reviewed with her the results of her event monitor which demonstrated some atrial tachycardia and some wide-complex rhythm that I thought was probably aberrancy. I will increase her atenolol by adding 25 mg in the morning to go along with her previous dose. She will let she feels with this.  DOE: This is being evaluated as above.    HTN:  The blood pressure is at target. No change in medications is indicated. We will continue with therapeutic lifestyle changes (TLC).  OVERWEIGHT:  The patient understands the need to lose  weight with diet and exercise. We have discussed specific strategies for this.   Current medicines are reviewed at length with the patient today.  The patient does not have concerns regarding medicines.  The following changes have been made:  Change as above  Labs/ tests ordered today include:   Orders Placed This Encounter  Procedures  . CBC  . COMPLETE METABOLIC PANEL WITH GFR  . TSH  . Protime-INR  . PTT  . LEFT HEART CATHETERIZATION WITH CORONARY ANGIOGRAM     Disposition:   FU with me in 2 months.     Signed, Rollene RotundaJames Chakita Mcgraw, MD  07/25/2014 4:52 PM    [facility]

## 2014-09-04 ENCOUNTER — Ambulatory Visit (INDEPENDENT_AMBULATORY_CARE_PROVIDER_SITE_OTHER): Payer: Federal, State, Local not specified - PPO | Admitting: Cardiology

## 2014-09-04 ENCOUNTER — Encounter: Payer: Self-pay | Admitting: Cardiology

## 2014-09-04 VITALS — BP 130/80 | HR 88 | Ht 64.0 in | Wt 216.4 lb

## 2014-09-04 DIAGNOSIS — R9439 Abnormal result of other cardiovascular function study: Secondary | ICD-10-CM

## 2014-09-04 NOTE — Progress Notes (Signed)
Cardiology Office Note   Date:  09/04/2014   ID:  Bethany Kemp, Bethany Kemp October 19, 1960, MRN [id number]  PCP:  Pcp Not In System  Cardiologist:   Rollene Rotunda, MD   Chief Complaint  Patient presents with  . Chest Pain      History of Present Illness: Bethany Kemp is a 54 y.o. female who presents for patient presents for evaluation of palpitations.  I had seen her for this. She also significant cardiovascular risk factors. A screening treadmill test was abnormal. However, cardiac catheterization demonstrated normal coronaries.  She returns for follow-up.  Since I saw her and increased her beta blocker dose she is doing better with fewer palpitations. She is not having any chest pressure, neck or arm discomfort. She is not having any new shortness of breath, PND or orthopnea. She has no weight gain or edema. She's had no presyncope or syncope.  Past Medical History  Diagnosis Date  . Obesities, morbid   . Hypertension   . GERD (gastroesophageal reflux disease)   . Asthma   . Gastritis     1980's was hospitalized  . Heart murmur   . Sleep apnea     BiPap    Past Surgical History  Procedure Laterality Date  . Bunionectomy  1993    bilateral  . Knee surgery  2003    Righ knee -- chondal implant  . Cholecystectomy  2005  . Hernia repair  1982  . Breast reduction surgery    . Cardiac catheterization N/A 07/30/2014    Procedure: Left Heart Cath and Coronary Angiography;  Surgeon: Tonny Bollman, MD;  Location: [facility] INVASIVE CV LAB;  Service: Cardiovascular;  Laterality: N/A;     Current Outpatient Prescriptions  Medication Sig Dispense Refill  . albuterol (PROVENTIL HFA;VENTOLIN HFA) 108 (90 BASE) MCG/ACT inhaler Inhale 2 puffs into the lungs every 4 (four) hours as needed for wheezing or shortness of breath.    Marland Kitchen amitriptyline (ELAVIL) 25 MG tablet Take 75 mg by mouth at bedtime.    Marland Kitchen amLODipine (NORVASC) 10 MG tablet Take 10 mg by mouth daily.    Marland Kitchen aspirin 81 MG tablet Take  81 mg by mouth daily.    Marland Kitchen atenolol (TENORMIN) 25 MG tablet Take 1 tablet (25 mg total) by mouth every morning. 30 tablet 6  . atenolol (TENORMIN) 50 MG tablet Take 50 mg by mouth at bedtime.     . Cholecalciferol 1000 UNITS tablet Take 1,000 Units by mouth daily.    . Fluticasone-Salmeterol (ADVAIR HFA IN) Inhale into the lungs daily.    Marland Kitchen loratadine (CLARITIN) 10 MG tablet Take 10 mg by mouth daily.    Marland Kitchen losartan (COZAAR) 50 MG tablet Take 50 mg by mouth daily.    . meloxicam (MOBIC) 15 MG tablet Take 15 mg by mouth daily.    . mometasone (ASMANEX) 220 MCG/INH inhaler Inhale 2 puffs into the lungs at bedtime.    Marland Kitchen omeprazole (PRILOSEC) 20 MG capsule Take 1 capsule by mouth 2 (two) times daily. Take 1 tab twice a day    . hydrochlorothiazide (HYDRODIURIL) 25 MG tablet Take 1 tablet by mouth as needed. Take 0.5 tab daily as needed     No current facility-administered medications for this visit.    Allergies:   Acetaminophen and Aspirin    ROS:  Please see the history of present illness.   Otherwise, review of systems are positive for none.   All other systems are reviewed  and negative.    PHYSICAL EXAM: VS:  BP 130/80 mmHg  Pulse 88  Ht 5\' 4"  (1.626 m)  Wt 216 lb 7 oz (98.175 kg)  BMI 37.13 kg/m2  LMP 08/28/2014 , BMI Body mass index is 37.13 kg/(m^2). GENERAL:  Well appearing NECK:  No jugular venous distention, waveform within normal limits, carotid upstroke brisk and symmetric, no bruits, no thyromegaly LUNGS:  Clear to auscultation bilaterally BACK:  No CVA tenderness CHEST:  Unremarkable HEART:  PMI not displaced or sustained,S1 and S2 within normal limits, no S3, no S4, no clicks, no rubs, soft left upper sternal murmur ABD:  Flat, positive bowel sounds normal in frequency in pitch, no bruits, no rebound, no guarding, no midline pulsatile mass, no hepatomegaly, no splenomegaly EXT:  2 plus pulses throughout, no edema, no cyanosis no clubbing, right wrist cath site OK.     EKG:  EKG is not ordered today.   Recent Labs: 07/25/2014: ALT 9; BUN 14; Creat 0.63; Hemoglobin 11.1*; Platelets 373; Potassium 4.6; Sodium 140; TSH 1.339    Lipid Panel No results found for: CHOL, TRIG, HDL, CHOLHDL, VLDL, LDLCALC, LDLDIRECT    Wt Readings from Last 3 Encounters:  09/04/14 216 lb 7 oz (98.175 kg)  07/30/14 214 lb (97.07 kg)  07/25/14 217 lb (98.431 kg)      Other studies Reviewed: Additional studies/ records that were reviewed today include:  POET (Plain Old Exercise Treadmill)   ASSESSMENT AND PLAN:  ABNORMAL POET (Plain Old Exercise Treadmill):  She had a false positive test. No further testing is indicated.  PALPITATIONS:  I reviewed with her the results of her event monitor which demonstrated some atrial tachycardia and some wide-complex rhythm that I thought was probably aberrancy.  She thinks she is better on the increased dose of beta blocker. She'll let me know if these get worse in the future at which point I would likely increase the a.m. dose of atenolol.  DOE: She's going to start exercising and losing weight and if she gets more short of breath she will need pulmonary workup.  HTN:  The blood pressure is at target. No change in medications is indicated. We will continue with therapeutic lifestyle changes (TLC).  OVERWEIGHT:  The patient understands the need to lose weight with diet and exercise. We have discussed specific strategies for this.   Current medicines are reviewed at length with the patient today.  The patient does not have concerns regarding medicines.  The following changes have been made: None  Labs/ tests ordered today include:  None     Disposition:   FU with me in 6months.     Signed, Rollene Rotunda, MD  09/04/2014 3:54 PM    [facility]

## 2014-09-04 NOTE — Patient Instructions (Signed)
Your physician wants you to follow-up in: 6 Months You will receive a reminder letter in the mail two months in advance. If you don't receive a letter, please call our office to schedule the follow-up appointment.  

## 2014-12-14 ENCOUNTER — Other Ambulatory Visit: Payer: Self-pay | Admitting: Neurology

## 2014-12-14 MED ORDER — RANITIDINE HCL 300 MG PO TABS: 300.0000 mg | ORAL_TABLET | Freq: Every day | ORAL | Status: DC

## 2015-02-19 ENCOUNTER — Ambulatory Visit: Payer: Federal, State, Local not specified - PPO | Admitting: Cardiology

## 2015-03-01 ENCOUNTER — Ambulatory Visit: Payer: Federal, State, Local not specified - PPO | Admitting: Cardiology

## 2015-03-05 ENCOUNTER — Ambulatory Visit (INDEPENDENT_AMBULATORY_CARE_PROVIDER_SITE_OTHER): Payer: Federal, State, Local not specified - PPO | Admitting: Cardiology

## 2015-03-05 ENCOUNTER — Encounter: Payer: Self-pay | Admitting: Cardiology

## 2015-03-05 VITALS — BP 122/72 | HR 82 | Ht 64.0 in | Wt 218.1 lb

## 2015-03-05 DIAGNOSIS — R002 Palpitations: Secondary | ICD-10-CM | POA: Diagnosis not present

## 2015-03-05 NOTE — Patient Instructions (Signed)
Your physician recommends that you schedule a follow-up appointment in: ONE YEAR WITH DR. HOCHREIN  

## 2015-03-05 NOTE — Progress Notes (Signed)
Cardiology Office Note   Date:  03/05/2015   ID:  Bethany Kemp, Bethany Kemp [birth year], MRN [id number]  PCP:  Pcp Not In System  Cardiologist:   Rollene Rotunda, MD   Chief Complaint  Patient presents with  . Palpitations      History of Present Illness: Bethany Kemp is a 55 y.o. female who presents for patient presents for evaluation of palpitations.   A screening treadmill test was abnormal. However, cardiac catheterization demonstrated normal coronaries.  She returns for follow-up.  Since I saw her and increased her beta blocker dose she is doing better with fewer palpitations.   Since I last saw her she has done well.  The patient denies any new symptoms such as chest discomfort, neck or arm discomfort. There has been no new shortness of breath, PND or orthopnea. There have been no reported palpitations, presyncope or syncope.  Past Medical History  Diagnosis Date  . Obesities, morbid (HCC)   . Hypertension   . GERD (gastroesophageal reflux disease)   . Asthma   . Gastritis     1980's was hospitalized  . Heart murmur   . Sleep apnea     BiPap    Past Surgical History  Procedure Laterality Date  . Bunionectomy  1993    bilateral  . Knee surgery  2003    Righ knee -- chondal implant  . Cholecystectomy  2005  . Hernia repair  1982  . Breast reduction surgery    . Cardiac catheterization N/A 07/30/2014    Procedure: Left Heart Cath and Coronary Angiography;  Surgeon: Tonny Bollman, MD;  Location: [facility] INVASIVE CV LAB;  Service: Cardiovascular;  Laterality: N/A;     Current Outpatient Prescriptions  Medication Sig Dispense Refill  . albuterol (PROVENTIL HFA;VENTOLIN HFA) 108 (90 BASE) MCG/ACT inhaler Inhale 2 puffs into the lungs every 4 (four) hours as needed for wheezing or shortness of breath.    Marland Kitchen amitriptyline (ELAVIL) 25 MG tablet Take 75 mg by mouth at bedtime.    Marland Kitchen amLODipine (NORVASC) 10 MG tablet Take 10 mg by mouth daily.    Marland Kitchen aspirin 81 MG tablet Take 81 mg  by mouth daily.    Marland Kitchen atenolol (TENORMIN) 25 MG tablet Take 1 tablet (25 mg total) by mouth every morning. 30 tablet 6  . atenolol (TENORMIN) 50 MG tablet Take 50 mg by mouth at bedtime.     . Fluticasone-Salmeterol (ADVAIR HFA IN) Inhale into the lungs daily.    Marland Kitchen gabapentin (NEURONTIN) 400 MG capsule Take 400 mg by mouth at bedtime.    . hydrochlorothiazide (HYDRODIURIL) 25 MG tablet Take 1 tablet by mouth as needed. Take 0.5 tab daily as needed    . loratadine (CLARITIN) 10 MG tablet Take 10 mg by mouth daily.    Marland Kitchen losartan (COZAAR) 50 MG tablet Take 50 mg by mouth daily.    . meloxicam (MOBIC) 15 MG tablet Take 15 mg by mouth daily.    . mometasone (ASMANEX) 220 MCG/INH inhaler Inhale 2 puffs into the lungs at bedtime.    Marland Kitchen omeprazole (PRILOSEC) 20 MG capsule Take 1 capsule by mouth 2 (two) times daily. Take 1 tab twice a day    . Vitamin D, Ergocalciferol, (DRISDOL) 50000 units CAPS capsule Take 1 capsule by mouth once a week. Take 1 cap by mouth once a week     No current facility-administered medications for this visit.    Allergies:   Ibuprofen; Acetaminophen; and  Aspirin    ROS:  Please see the history of present illness.   Otherwise, review of systems are positive for none.   All other systems are reviewed and negative.    PHYSICAL EXAM: VS:  BP 122/72 mmHg  Pulse 82  Ht  (1.626 m)  Wt 218 lb 1 oz (98.913 kg)  BMI 37.41 kg/m2  LMP 01/13/2015 , BMI Body mass index is 37.41 kg/(m^2). GENERAL:  Well appearing NECK:  No jugular venous distention, waveform within normal limits, carotid upstroke brisk and symmetric, no bruits, no thyromegaly LUNGS:  Clear to auscultation bilaterally BACK:  No CVA tenderness CHEST:  Unremarkable HEART:  PMI not displaced or sustained,S1 and S2 within normal limits, no S3, no S4, no clicks, no rubs, soft left upper sternal murmur ABD:  Flat, positive bowel sounds normal in frequency in pitch, no bruits, no rebound, no guarding, no midline  pulsatile mass, no hepatomegaly, no splenomegaly EXT:  2 plus pulses throughout, no edema, no cyanosis no clubbing, right wrist cath site OK.    EKG:  EKG is ordered today. NSR, rate 83, axis WNL, intervals WNL, nonspecific anterior T wave inversion.  No change from previous.     Recent Labs: 07/25/2014: ALT 9; BUN 14; Creat 0.63; Hemoglobin 11.1*; Platelets 373; Potassium 4.6; Sodium 140; TSH 1.339    Lipid Panel No results found for: CHOL, TRIG, HDL, CHOLHDL, VLDL, LDLCALC, LDLDIRECT    Wt Readings from Last 3 Encounters:  03/05/15 218 lb 1 oz (98.913 kg)  09/04/14 216 lb 7 oz (98.175 kg)  07/30/14 214 lb (97.07 kg)      Other studies Reviewed:  None   ASSESSMENT AND PLAN:  ABNORMAL POET (Plain Old Exercise Treadmill):  She had a false positive test. No further testing is indicated.  PALPITATIONS:  These are mild and she will continue the meds as listed.  HTN:  The blood pressure is at target. No change in medications is indicated. We will continue with therapeutic lifestyle changes (TLC).  OVERWEIGHT:  The patient understands the need to lose weight with diet and exercise. We have discussed specific strategies for this.  MURMUR:  I suspect a flow murmur but she will come back in one year for follow up of this.    Current medicines are reviewed at length with the patient today.  The patient does not have concerns regarding medicines.  The following changes have been made: None  Labs/ tests ordered today include:  None     Disposition:   FU with me in 12 months.     Signed, Rollene Rotunda, MD  03/05/2015 4:14 PM    [facility]

## 2015-03-13 NOTE — Addendum Note (Signed)
Addended by: Neta Ehlers on: 03/13/2015 11:41 AM   Modules accepted: Orders

## 2016-02-13 ENCOUNTER — Encounter (INDEPENDENT_AMBULATORY_CARE_PROVIDER_SITE_OTHER): Payer: Self-pay | Admitting: Orthopedic Surgery

## 2016-02-13 ENCOUNTER — Ambulatory Visit (INDEPENDENT_AMBULATORY_CARE_PROVIDER_SITE_OTHER): Payer: Federal, State, Local not specified - PPO | Admitting: Orthopedic Surgery

## 2016-02-13 DIAGNOSIS — M1712 Unilateral primary osteoarthritis, left knee: Secondary | ICD-10-CM | POA: Diagnosis not present

## 2016-02-13 MED ORDER — BUPIVACAINE HCL 0.25 % IJ SOLN
4.0000 mL | INTRAMUSCULAR | Status: AC | PRN
  Administered 2016-02-13: 4 mL via INTRA_ARTICULAR

## 2016-02-13 MED ORDER — METHYLPREDNISOLONE ACETATE 40 MG/ML IJ SUSP
40.0000 mg | INTRAMUSCULAR | Status: AC | PRN
  Administered 2016-02-13: 40 mg via INTRA_ARTICULAR

## 2016-02-13 MED ORDER — LIDOCAINE HCL 1 % IJ SOLN
5.0000 mL | INTRAMUSCULAR | Status: AC | PRN
  Administered 2016-02-13: 5 mL

## 2016-02-13 NOTE — Progress Notes (Signed)
Office Visit Note   Patient: Bethany Kemp           Date of Birth: 1960/01/26           MRN: [id number] Visit Date: 02/13/2016 Requested by: No referring provider defined for this encounter. PCP: Pcp Not In System  Subjective: Chief Complaint  Patient presents with  . Left Knee - Pain    HPI 56 year old patient with left knee arthritis who had Synvisc one injection in September.  She's had some recurrent pain in that left knee but no interval injury.  She takes very counter medications.  She is requesting a cortisone injection today.  She is able to do her activities of daily living but reports dull aching pain in that left knee which is mostly arthritic in nature.              Review of Systems All systems reviewed are negative as they relate to the chief complaint within the history of present illness.  Patient denies  fevers or chills.    Assessment & Plan: Visit Diagnoses:  1. Primary osteoarthritis of left knee     Plan: Impression is left knee arthritis plan injection performed today for pain relief.  I think in this shot wears off we will be able to repeat the Synvisc injection which helped her.  I'm going to see her back as needed.  Nonweightbearing quad strengthening exercises encouraged.  Follow-Up Instructions: Return if symptoms worsen or fail to improve.   Orders:  No orders of the defined types were placed in this encounter.  No orders of the defined types were placed in this encounter.     Procedures: Large Joint Inj Date/Time: 02/13/2016 3:09 PM Performed by: [name] Authorized by: [name]   Consent Given by:  Patient Site marked: the procedure site was marked   Timeout: prior to procedure the correct patient, procedure, and site was verified   Indications:  Pain, joint swelling and diagnostic evaluation Location:  Knee Site:  L knee Prep: patient was prepped and draped in usual sterile fashion   Needle Size:  18 G Needle  Length:  1.5 inches Approach:  Superolateral Ultrasound Guidance: No   Fluoroscopic Guidance: No   Arthrogram: No   Medications:  5 mL lidocaine 1 %; 4 mL bupivacaine 0.25 %; 40 mg methylPREDNISolone acetate 40 MG/ML Patient tolerance:  Patient tolerated the procedure well with no immediate complications     Clinical Data: No additional findings.  Objective: Vital Signs: There were no vitals taken for this visit.  Physical Exam   Constitutional: Patient appears well-developed HEENT:  Head: Normocephalic Eyes:EOM are normal Neck: Normal range of motion Cardiovascular: Normal rate Pulmonary/chest: Effort normal Neurologic: Patient is alert Skin: Skin is warm Psychiatric: Patient has normal mood and affect    Ortho Exam left knee exam demonstrates full active and passive range of motion lacking about 5 of full extension but has reasonable flexion.  Collaterals are stable.  Extensor mechanism is intact.  There is no groin pain with internal/external rotation leg.  No other masses lymph intervention JJ of the left knee region.  Specialty Comments:  No specialty comments available.  Imaging: No results found.   PMFS History: Patient Active Problem List   Diagnosis Date Noted  . Abnormal stress ECG with treadmill 07/30/2014  . Palpitation 06/22/2014  . Dyspnea 06/22/2014   Past Medical History:  Diagnosis Date  . Asthma   . Gastritis  1980's was hospitalized  . GERD (gastroesophageal reflux disease)   . Heart murmur   . Hypertension   . Obesities, morbid (HCC)   . Sleep apnea    BiPap    Family History  Problem Relation Age of Onset  . Asthma Father   . Heart attack Mother [id number]    Died with MI age 55  . Arthritis Mother   . Diabetes Mother   . Stroke Sister   . Breast cancer Sister   . Asthma Daughter     Past Surgical History:  Procedure Laterality Date  . BREAST REDUCTION SURGERY    . BUNIONECTOMY  1993   bilateral  . CARDIAC CATHETERIZATION N/A  07/30/2014   Procedure: Left Heart Cath and Coronary Angiography;  Surgeon: Tonny BollmanMichael Cooper, MD;  [facility];  Service: Cardiovascular;  Laterality: N/A;  . CHOLECYSTECTOMY  2005  . HERNIA REPAIR  1982  . KNEE SURGERY  2003   Righ knee -- chondal implant   Social History   Occupational History  . Not on file.   Social History Main Topics  . Smoking status: Never Smoker  . Smokeless tobacco: Never Used  . Alcohol use No  . Drug use: No  . Sexual activity: Not on file

## 2016-04-29 ENCOUNTER — Telehealth (INDEPENDENT_AMBULATORY_CARE_PROVIDER_SITE_OTHER): Payer: Self-pay | Admitting: Orthopedic Surgery

## 2016-04-29 NOTE — Telephone Encounter (Signed)
Last injection of Synvisc done 09/23/15. Ok to repeat? Had cortisone injection in Feb 2018

## 2016-04-29 NOTE — Telephone Encounter (Signed)
Pt would like another Synvisc inj. Please advise as to if I can sch this.  [phone]

## 2016-04-29 NOTE — Telephone Encounter (Signed)
y

## 2016-04-29 NOTE — Telephone Encounter (Signed)
Called patient and advised would submit for VOB

## 2016-05-07 ENCOUNTER — Encounter (INDEPENDENT_AMBULATORY_CARE_PROVIDER_SITE_OTHER): Payer: Self-pay

## 2016-05-07 ENCOUNTER — Encounter (INDEPENDENT_AMBULATORY_CARE_PROVIDER_SITE_OTHER): Payer: Self-pay | Admitting: Orthopedic Surgery

## 2016-05-07 ENCOUNTER — Ambulatory Visit (INDEPENDENT_AMBULATORY_CARE_PROVIDER_SITE_OTHER): Payer: Federal, State, Local not specified - PPO | Admitting: Orthopedic Surgery

## 2016-05-07 DIAGNOSIS — M1712 Unilateral primary osteoarthritis, left knee: Secondary | ICD-10-CM | POA: Insufficient documentation

## 2016-05-10 DIAGNOSIS — M1712 Unilateral primary osteoarthritis, left knee: Secondary | ICD-10-CM | POA: Diagnosis not present

## 2016-05-10 MED ORDER — HYLAN G-F 20 48 MG/6ML IX SOSY
48.0000 mg | PREFILLED_SYRINGE | INTRA_ARTICULAR | Status: AC | PRN
  Administered 2016-05-10: 48 mg via INTRA_ARTICULAR

## 2016-05-10 MED ORDER — LIDOCAINE HCL 1 % IJ SOLN
5.0000 mL | INTRAMUSCULAR | Status: AC | PRN
  Administered 2016-05-10: 5 mL

## 2016-05-10 NOTE — Progress Notes (Signed)
   Procedure Note  Patient: DELROSE ROHWER             Date of Birth: 01/21/1960           MRN: [id number]             Visit Date: 05/07/2016  Procedures: Visit Diagnoses: Unilateral primary osteoarthritis, left knee  Large Joint Inj Date/Time: 05/10/2016 11:05 PM Performed by: [name] Authorized by: [name]   Consent Given by:  Patient Site marked: the procedure site was marked   Timeout: prior to procedure the correct patient, procedure, and site was verified   Indications:  Pain, joint swelling and diagnostic evaluation Location:  Knee Site:  L knee Prep: patient was prepped and draped in usual sterile fashion   Needle Size:  18 G Needle Length:  1.5 inches Approach:  Superolateral Ultrasound Guidance: No   Fluoroscopic Guidance: No   Arthrogram: No   Medications:  5 mL lidocaine 1 %; 48 mg Hylan 48 MG/6ML Patient tolerance:  Patient tolerated the procedure well with no immediate complications

## 2016-10-27 DIAGNOSIS — Z803 Family history of malignant neoplasm of breast: Secondary | ICD-10-CM | POA: Diagnosis not present

## 2016-10-27 DIAGNOSIS — Z1231 Encounter for screening mammogram for malignant neoplasm of breast: Secondary | ICD-10-CM | POA: Diagnosis not present

## 2016-11-10 DIAGNOSIS — K080 Exfoliation of teeth due to systemic causes: Secondary | ICD-10-CM | POA: Diagnosis not present

## 2016-11-11 DIAGNOSIS — J029 Acute pharyngitis, unspecified: Secondary | ICD-10-CM | POA: Diagnosis not present

## 2016-11-11 DIAGNOSIS — J069 Acute upper respiratory infection, unspecified: Secondary | ICD-10-CM | POA: Diagnosis not present

## 2016-11-16 DIAGNOSIS — R05 Cough: Secondary | ICD-10-CM | POA: Diagnosis not present

## 2016-11-16 DIAGNOSIS — J4521 Mild intermittent asthma with (acute) exacerbation: Secondary | ICD-10-CM | POA: Diagnosis not present

## 2016-11-16 DIAGNOSIS — Z8709 Personal history of other diseases of the respiratory system: Secondary | ICD-10-CM | POA: Diagnosis not present

## 2016-11-16 DIAGNOSIS — I10 Essential (primary) hypertension: Secondary | ICD-10-CM | POA: Diagnosis not present

## 2017-03-18 ENCOUNTER — Ambulatory Visit (INDEPENDENT_AMBULATORY_CARE_PROVIDER_SITE_OTHER): Payer: Federal, State, Local not specified - PPO | Admitting: Orthopedic Surgery

## 2017-03-18 ENCOUNTER — Encounter (INDEPENDENT_AMBULATORY_CARE_PROVIDER_SITE_OTHER): Payer: Self-pay | Admitting: Orthopedic Surgery

## 2017-03-18 DIAGNOSIS — M1712 Unilateral primary osteoarthritis, left knee: Secondary | ICD-10-CM | POA: Diagnosis not present

## 2017-03-18 MED ORDER — LIDOCAINE HCL 1 % IJ SOLN
5.0000 mL | INTRAMUSCULAR | Status: AC | PRN
  Administered 2017-03-18: 5 mL

## 2017-03-18 MED ORDER — METHYLPREDNISOLONE ACETATE 40 MG/ML IJ SUSP
40.0000 mg | INTRAMUSCULAR | Status: AC | PRN
  Administered 2017-03-18: 40 mg via INTRA_ARTICULAR

## 2017-03-18 MED ORDER — BUPIVACAINE HCL 0.25 % IJ SOLN
4.0000 mL | INTRAMUSCULAR | Status: AC | PRN
  Administered 2017-03-18: 4 mL via INTRA_ARTICULAR

## 2017-03-18 NOTE — Progress Notes (Signed)
Office Visit Note   Patient: Bethany Kemp           Date of Birth: 11-Feb-1960           MRN: [id number] Visit Date: 03/18/2017 Requested by: No referring provider defined for this encounter. PCP: System, Pcp Not In  Subjective: Chief Complaint  Patient presents with  . Left Knee - Follow-up, Pain    HPI: Bethany Kemp is a patient with left knee pain.  Has known history of arthritis.  Had an injection in April of last year and has had good relief for about 8 months.  She works at the [employer].  Has to do a lot of standing at times.  She may need knee replacement in the future.              ROS: All systems reviewed are negative as they relate to the chief complaint within the history of present illness.  Patient denies  fevers or chills.   Assessment & Plan: Visit Diagnoses:  1. Unilateral primary osteoarthritis, left knee     Plan: Impression is left knee pain and arthritis.  Plan is aspiration and injection today.  Continue with nonweightbearing quad strengthening exercises.  Follow-up with me as needed.  Follow-Up Instructions: Return if symptoms worsen or fail to improve.   Orders:  No orders of the defined types were placed in this encounter.  No orders of the defined types were placed in this encounter.     Procedures: Large Joint Inj: L knee on 03/18/2017 11:08 AM Indications: diagnostic evaluation, joint swelling and pain Details: 18 G 1.5 in needle, superolateral approach  Arthrogram: No  Medications: 5 mL lidocaine 1 %; 40 mg methylPREDNISolone acetate 40 MG/ML; 4 mL bupivacaine 0.25 % Outcome: tolerated well, no immediate complications Procedure, treatment alternatives, risks and benefits explained, specific risks discussed. Consent was given by the patient. Immediately prior to procedure a time out was called to verify the correct patient, procedure, equipment, support staff and site/side marked as required. Patient was prepped and draped in the usual sterile  fashion.       Clinical Data: No additional findings.  Objective: Vital Signs: There were no vitals taken for this visit.  Physical Exam:   Constitutional: Patient appears well-developed HEENT:  Head: Normocephalic Eyes:EOM are normal Neck: Normal range of motion Cardiovascular: Normal rate Pulmonary/chest: Effort normal Neurologic: Patient is alert Skin: Skin is warm Psychiatric: Patient has normal mood and affect    Ortho Exam: Orth O exam demonstrates full active and passive range of motion of that left knee with mild effusion.  Collateral cruciate ligaments are stable.  Extensor mechanism is intact.  Patient has medial and lateral joint line tenderness with mild patellofemoral crepitus.  Specialty Comments:  No specialty comments available.  Imaging: No results found.   PMFS History: Patient Active Problem List   Diagnosis Date Noted  . Unilateral primary osteoarthritis, left knee 05/07/2016  . Abnormal stress ECG with treadmill 07/30/2014  . Palpitation 06/22/2014  . Dyspnea 06/22/2014   Past Medical History:  Diagnosis Date  . Asthma   . Gastritis    1980's was hospitalized  . GERD (gastroesophageal reflux disease)   . Heart murmur   . Hypertension   . Obesities, morbid (HCC)   . Sleep apnea    BiPap    Family History  Problem Relation Age of Onset  . Asthma Father   . Heart attack Mother 52  Died with MI age 56  . Arthritis Mother   . Diabetes Mother   . Stroke Sister   . Breast cancer Sister   . Asthma Daughter     Past Surgical History:  Procedure Laterality Date  . BREAST REDUCTION SURGERY    . BUNIONECTOMY  1993   bilateral  . CARDIAC CATHETERIZATION N/A 07/30/2014   Procedure: Left Heart Cath and Coronary Angiography;  Surgeon: Tonny BollmanMichael Cooper, MD;  [facility];  Service: Cardiovascular;  Laterality: N/A;  . CHOLECYSTECTOMY  2005  . HERNIA REPAIR  1982  . KNEE SURGERY  2003   Righ knee -- chondal implant    Social History   Occupational History  . Not on file  Tobacco Use  . Smoking status: Never Smoker  . Smokeless tobacco: Never Used  Substance and Sexual Activity  . Alcohol use: No  . Drug use: No  . Sexual activity: Not on file

## 2017-04-08 ENCOUNTER — Other Ambulatory Visit: Payer: Self-pay | Admitting: Gastroenterology

## 2017-04-08 DIAGNOSIS — K219 Gastro-esophageal reflux disease without esophagitis: Secondary | ICD-10-CM | POA: Diagnosis not present

## 2017-04-08 DIAGNOSIS — R1012 Left upper quadrant pain: Secondary | ICD-10-CM

## 2017-04-16 ENCOUNTER — Ambulatory Visit
Admission: RE | Admit: 2017-04-16 | Discharge: 2017-04-16 | Disposition: A | Discharge status: Home / Self Care | Payer: Federal, State, Local not specified - PPO | Source: Ambulatory Visit | Attending: Gastroenterology | Admitting: Gastroenterology

## 2017-04-16 DIAGNOSIS — R1012 Left upper quadrant pain: Secondary | ICD-10-CM | POA: Diagnosis not present

## 2017-04-16 MED ORDER — IOPAMIDOL (ISOVUE-300) INJECTION 61%
125.0000 mL | Freq: Once | INTRAVENOUS | Status: AC | PRN
  Administered 2017-04-16: 125 mL via INTRAVENOUS

## 2017-05-11 DIAGNOSIS — K5792 Diverticulitis of intestine, part unspecified, without perforation or abscess without bleeding: Secondary | ICD-10-CM | POA: Diagnosis not present

## 2017-06-12 DIAGNOSIS — H04123 Dry eye syndrome of bilateral lacrimal glands: Secondary | ICD-10-CM | POA: Diagnosis not present

## 2017-06-12 DIAGNOSIS — H40033 Anatomical narrow angle, bilateral: Secondary | ICD-10-CM | POA: Diagnosis not present

## 2017-07-03 DIAGNOSIS — H04123 Dry eye syndrome of bilateral lacrimal glands: Secondary | ICD-10-CM | POA: Diagnosis not present

## 2017-08-16 ENCOUNTER — Telehealth (INDEPENDENT_AMBULATORY_CARE_PROVIDER_SITE_OTHER): Payer: Self-pay | Admitting: Orthopedic Surgery

## 2017-08-16 ENCOUNTER — Other Ambulatory Visit (INDEPENDENT_AMBULATORY_CARE_PROVIDER_SITE_OTHER): Payer: Self-pay

## 2017-08-16 MED ORDER — MELOXICAM 7.5 MG PO TABS: 7.5000 mg | ORAL_TABLET | Freq: Every day | ORAL | 1 refills | Status: DC

## 2017-08-16 NOTE — Telephone Encounter (Signed)
Called into pharmacy

## 2017-08-16 NOTE — Telephone Encounter (Signed)
Please advise 

## 2017-08-16 NOTE — Telephone Encounter (Signed)
Mobic 7.5mg  one po q day #30 zero

## 2017-08-16 NOTE — Telephone Encounter (Signed)
Patient called stating that her knee is swollen.  I did make an appointment for her for Aug 26.  Patient is requesting an antiinflammatory be called in to the [facility] on [address]  CB#[phone].  Thank you.

## 2017-08-26 DIAGNOSIS — K080 Exfoliation of teeth due to systemic causes: Secondary | ICD-10-CM | POA: Diagnosis not present

## 2017-09-06 ENCOUNTER — Ambulatory Visit (INDEPENDENT_AMBULATORY_CARE_PROVIDER_SITE_OTHER): Payer: Federal, State, Local not specified - PPO | Admitting: Orthopedic Surgery

## 2017-09-06 ENCOUNTER — Encounter (INDEPENDENT_AMBULATORY_CARE_PROVIDER_SITE_OTHER): Payer: Self-pay | Admitting: Orthopedic Surgery

## 2017-09-06 DIAGNOSIS — M1712 Unilateral primary osteoarthritis, left knee: Secondary | ICD-10-CM | POA: Diagnosis not present

## 2017-09-06 NOTE — Progress Notes (Signed)
Office Visit Note   Patient: Bethany Kemp           Date of Birth: [birth year]           MRN: [id number] Visit Date: 09/06/2017 Requested by: No referring provider defined for this encounter. PCP: System, Pcp Not In  Subjective: Chief Complaint  Patient presents with  . Left Knee - Follow-up    HPI: Patient presents with left knee pain.  She has had an injection in March 2000 30 April 2016 in February 2018.  She has known history of arthritis.  She started taking Mobic several weeks ago.  She is been doing well with that.  She states that her standing endurance is about the same.  Does hurt her to walk for long period of time.  She is going to retire in a year.              ROS: All systems reviewed are negative as they relate to the chief complaint within the history of present illness.  Patient denies  fevers or chills.   Assessment & Plan: Visit Diagnoses: No diagnosis found.  Plan: Impression is left knee arthritis.  Currently no effusion and no real significant increase in clinical symptoms.  Plan is observation for now.  I do want her to try taking Mobic every other day instead of every day.  If her knee flares up again or she has to diminish her dose of Mobic then we could consider another injection.  Continue with non-loadbearing quad strengthening exercises follow-up as needed.  I think she is given a knee replacement at some time in the future but at this point she is doing well.  Follow-Up Instructions: No follow-ups on file.   Orders:  No orders of the defined types were placed in this encounter.  No orders of the defined types were placed in this encounter.     Procedures: No procedures performed   Clinical Data: No additional findings.  Objective: Vital Signs: There were no vitals taken for this visit.  Physical Exam:  Constitutional: Patient appears well-developed HEENT:  Head: Normocephalic Eyes:EOM are normal Neck: Normal range of  motion Cardiovascular: Normal rate Pulmonary/chest: Effort normal Neurologic: Patient is alert Skin: Skin is warm Psychiatric: Patient has normal mood and affect    Ortho Exam: Ortho exam demonstrates no effusion in the left knee with good range of motion intact extensor mechanism minimal patellofemoral crepitus on the left.  Pedal pulses palpable and there is no groin pain with internal/external rotation of that left leg.  Specialty Comments:  No specialty comments available.  Imaging: No results found.   PMFS History: Patient Active Problem List   Diagnosis Date Noted  . Unilateral primary osteoarthritis, left knee 05/07/2016  . Abnormal stress ECG with treadmill 07/30/2014  . Palpitation 06/22/2014  . Dyspnea 06/22/2014   Past Medical History:  Diagnosis Date  . Asthma   . Gastritis    1980's was hospitalized  . GERD (gastroesophageal reflux disease)   . Heart murmur   . Hypertension   . Obesities, morbid (HCC)   . Sleep apnea    BiPap    Family History  Problem Relation Age of Onset  . Asthma Father   . Heart attack Mother [id number]       Died with MI age 56  . Arthritis Mother   . Diabetes Mother   . Stroke Sister   . Breast cancer Sister   . Asthma Daughter  Past Surgical History:  Procedure Laterality Date  . BREAST REDUCTION SURGERY    . BUNIONECTOMY  1993   bilateral  . CARDIAC CATHETERIZATION N/A 07/30/2014   Procedure: Left Heart Cath and Coronary Angiography;  Surgeon: Tonny Bollman, MD;  Location: [facility] INVASIVE CV LAB;  Service: Cardiovascular;  Laterality: N/A;  . CHOLECYSTECTOMY  2005  . HERNIA REPAIR  1982  . KNEE SURGERY  2003   Righ knee -- chondal implant   Social History   Occupational History  . Not on file  Tobacco Use  . Smoking status: Never Smoker  . Smokeless tobacco: Never Used  Substance and Sexual Activity  . Alcohol use: No  . Drug use: No  . Sexual activity: Not on file

## 2017-09-21 DIAGNOSIS — G4730 Sleep apnea, unspecified: Secondary | ICD-10-CM | POA: Diagnosis not present

## 2017-11-02 ENCOUNTER — Other Ambulatory Visit (INDEPENDENT_AMBULATORY_CARE_PROVIDER_SITE_OTHER): Payer: Self-pay | Admitting: Physician Assistant

## 2017-11-04 DIAGNOSIS — R921 Mammographic calcification found on diagnostic imaging of breast: Secondary | ICD-10-CM | POA: Diagnosis not present

## 2017-11-04 DIAGNOSIS — Z1231 Encounter for screening mammogram for malignant neoplasm of breast: Secondary | ICD-10-CM | POA: Diagnosis not present

## 2017-11-04 DIAGNOSIS — M25569 Pain in unspecified knee: Secondary | ICD-10-CM | POA: Diagnosis not present

## 2017-11-04 DIAGNOSIS — R7303 Prediabetes: Secondary | ICD-10-CM | POA: Diagnosis not present

## 2017-12-01 ENCOUNTER — Other Ambulatory Visit (INDEPENDENT_AMBULATORY_CARE_PROVIDER_SITE_OTHER): Payer: Self-pay | Admitting: Orthopaedic Surgery

## 2017-12-07 DIAGNOSIS — E669 Obesity, unspecified: Secondary | ICD-10-CM | POA: Diagnosis not present

## 2017-12-07 DIAGNOSIS — R7303 Prediabetes: Secondary | ICD-10-CM | POA: Diagnosis not present

## 2017-12-07 DIAGNOSIS — Z6836 Body mass index (BMI) 36.0-36.9, adult: Secondary | ICD-10-CM | POA: Diagnosis not present

## 2017-12-07 DIAGNOSIS — Z713 Dietary counseling and surveillance: Secondary | ICD-10-CM | POA: Diagnosis not present

## 2017-12-30 ENCOUNTER — Other Ambulatory Visit (INDEPENDENT_AMBULATORY_CARE_PROVIDER_SITE_OTHER): Payer: Self-pay | Admitting: Orthopaedic Surgery

## 2018-01-29 DIAGNOSIS — J4521 Mild intermittent asthma with (acute) exacerbation: Secondary | ICD-10-CM | POA: Diagnosis not present

## 2018-03-02 DIAGNOSIS — K080 Exfoliation of teeth due to systemic causes: Secondary | ICD-10-CM | POA: Diagnosis not present

## 2018-03-08 DIAGNOSIS — M1712 Unilateral primary osteoarthritis, left knee: Secondary | ICD-10-CM | POA: Diagnosis not present

## 2018-05-10 DIAGNOSIS — K5900 Constipation, unspecified: Secondary | ICD-10-CM | POA: Diagnosis not present

## 2018-05-10 DIAGNOSIS — K219 Gastro-esophageal reflux disease without esophagitis: Secondary | ICD-10-CM | POA: Diagnosis not present

## 2018-05-10 DIAGNOSIS — K5790 Diverticulosis of intestine, part unspecified, without perforation or abscess without bleeding: Secondary | ICD-10-CM | POA: Diagnosis not present

## 2018-05-10 DIAGNOSIS — Z8371 Family history of colonic polyps: Secondary | ICD-10-CM | POA: Diagnosis not present

## 2018-07-12 ENCOUNTER — Telehealth: Payer: Self-pay | Admitting: *Deleted

## 2018-07-12 NOTE — Telephone Encounter (Signed)
Unable to leave a message, no voicemail.  

## 2018-08-01 NOTE — Progress Notes (Addendum)
Cardiology Office Note   Date:  08/03/2018   ID:  Bethany Kemp, DOB July 31, 1960, MRN [id number]  PCP:  System, Pcp Not In  Cardiologist:   Rollene RotundaJames Jahira Swiss, MD   Chief Complaint  Patient presents with  . Heart Murmur      History of Present Illness: Bethany Kemp is a 58 y.o. female who who presents for patient presents for evaluation of palpitations.  I saw her last in Feb 2017.  A screening treadmill test was abnormal. However, cardiac catheterization demonstrated normal coronaries.   She had a murmur that I have followed clinically.  Since I last saw her she has done well.  She is going to retire soon from the [employer].  She does not exercise but she wants to.  The patient denies any new symptoms such as chest discomfort, neck or arm discomfort. There has been no new shortness of breath, PND or orthopnea. There have been no reported palpitations, presyncope or syncope.    Past Medical History:  Diagnosis Date  . Asthma   . Gastritis    1980's was hospitalized  . GERD (gastroesophageal reflux disease)   . Heart murmur   . Hypertension   . Obesities, morbid (HCC)   . Sleep apnea    BiPap    Past Surgical History:  Procedure Laterality Date  . BREAST REDUCTION SURGERY    . BUNIONECTOMY  1993   bilateral  . CARDIAC CATHETERIZATION N/A 07/30/2014   Procedure: Left Heart Cath and Coronary Angiography;  Surgeon: Tonny BollmanMichael Cooper, MD;  [facility] LAB;  Service: Cardiovascular;  Laterality: N/A;  . CHOLECYSTECTOMY  2005  . HERNIA REPAIR  1982  . KNEE SURGERY  2003   Righ knee -- chondal implant   Family History  Problem Relation Age of Onset  . Asthma Father   . Heart attack Mother 2065       Died with MI age 57  . Arthritis Mother   . Diabetes Mother   . Stroke Sister   . Breast cancer Sister   . Asthma Daughter    Social History   Socioeconomic History  . Marital status: Single    Spouse name: Not on file  . Number of children: 1  . Years of  education: Not on file  . Highest education level: Not on file  Occupational History  . Not on file  Social Needs  . Financial resource strain: Not on file  . Food insecurity    Worry: Not on file    Inability: Not on file  . Transportation needs    Medical: Not on file    Non-medical: Not on file  Tobacco Use  . Smoking status: Never Smoker  . Smokeless tobacco: Never Used  Substance and Sexual Activity  . Alcohol use: No  . Drug use: No  . Sexual activity: Not on file  Lifestyle  . Physical activity    Days per week: Not on file    Minutes per session: Not on file  . Stress: Not on file  Relationships  . Social Musicianconnections    Talks on phone: Not on file    Gets together: Not on file    Attends religious service: Not on file    Active member of club or organization: Not on file    Attends meetings of clubs or organizations: Not on file    Relationship status: Not on file  . Intimate partner violence  Fear of current or ex partner: Not on file    Emotionally abused: Not on file    Physically abused: Not on file    Forced sexual activity: Not on file  Other Topics Concern  . Not on file  Social History Narrative   Lives alone.      Current Outpatient Medications  Medication Sig Dispense Refill  . albuterol (PROVENTIL HFA;VENTOLIN HFA) 108 (90 BASE) MCG/ACT inhaler Inhale 2 puffs into the lungs every 4 (four) hours as needed for wheezing or shortness of breath.    Marland Kitchen. amitriptyline (ELAVIL) 25 MG tablet Take 75 mg by mouth at bedtime.    Marland Kitchen. amLODipine (NORVASC) 10 MG tablet Take 10 mg by mouth daily.    Marland Kitchen. aspirin 81 MG tablet Take 81 mg by mouth daily.    Marland Kitchen. atenolol (TENORMIN) 25 MG tablet Take 1 tablet (25 mg total) by mouth every morning. 30 tablet 6  . atenolol (TENORMIN) 50 MG tablet Take 50 mg by mouth at bedtime.     . Fluticasone-Salmeterol (ADVAIR HFA IN) Inhale into the lungs daily.    Marland Kitchen. gabapentin (NEURONTIN) 400 MG capsule Take 400 mg by mouth at bedtime.     . hydrochlorothiazide (HYDRODIURIL) 25 MG tablet Take 1 tablet by mouth as needed. Take 0.5 tab daily as needed    . loratadine (CLARITIN) 10 MG tablet Take 10 mg by mouth daily.    Marland Kitchen. losartan (COZAAR) 50 MG tablet Take 50 mg by mouth daily.    . meloxicam (MOBIC) 15 MG tablet Take 1 tablet by mouth daily.    . mometasone (ASMANEX) 220 MCG/INH inhaler Inhale 2 puffs into the lungs at bedtime.    Marland Kitchen. omeprazole (PRILOSEC) 20 MG capsule Take 1 capsule by mouth 2 (two) times daily. Take 1 tab twice a day    . Vitamin D, Ergocalciferol, (DRISDOL) 50000 units CAPS capsule Take 1 capsule by mouth once a week. Take 1 cap by mouth once a week     No current facility-administered medications for this visit.     Allergies:   Ibuprofen, Acetaminophen, and Aspirin    ROS:  Please see the history of present illness.   Otherwise, review of systems are positive for none.   All other systems are reviewed and negative.    PHYSICAL EXAM: VS:  BP 129/83   Pulse 80   Temp 98.2 F (36.8 C) (Temporal)   Ht 5\' 4"  (1.626 m)   Wt 226 lb 12.8 oz (102.9 kg)   SpO2 99%   BMI 38.93 kg/m  , BMI Body mass index is 38.93 kg/m. GENERAL:  Well appearing NECK:  No jugular venous distention, waveform within normal limits, carotid upstroke brisk and symmetric, no bruits, no thyromegaly LUNGS:  Clear to auscultation bilaterally CHEST:  Unremarkable HEART:  PMI not displaced or sustained,S1 and S2 within normal limits, no S3, no S4, no clicks, no rubs, 2 out of 6 apical murmurs ABD:  Flasystolic murmur also heard at the left upper sternal border, brief, early, decreases with the strain phase of Valsalva, no diastolic  t, positive bowel sounds normal in frequency in pitch, no bruits, no rebound, no guarding, no midline pulsatile mass, no hepatomegaly, no splenomegaly EXT:  2 plus pulses throughout, no edema, no cyanosis no clubbing    EKG:  EKG is ordered today. The ekg ordered today demonstrates sinus rhythm, rate  98, axis within normal limits, intervals within normal limits, no acute ST-T wave changes.  Recent Labs: No results found for requested labs within last 8760 hours.    Lipid Panel No results found for: CHOL, TRIG, HDL, CHOLHDL, VLDL, LDLCALC, LDLDIRECT    Wt Readings from Last 3 Encounters:  08/03/18 226 lb 12.8 oz (102.9 kg)  03/05/15 218 lb 1 oz (98.9 kg)  09/04/14 216 lb 7 oz (98.2 kg)      Other studies Reviewed: Additional studies/ records that were reviewed today include: None. Review of the above records demonstrates:  Please see elsewhere in the note.     ASSESSMENT AND PLAN:  PALPITATIONS:   These are well controlled with her regimen of beta-blocker.  She wants to continue this.  No change in therapy.   HTN:  The blood pressure is at target.  No change in therapy.    OVERWEIGHT:   We talked at length about this.  She can start an exercise regimen.  I gave her specific instructions about this and diet.   MURMUR:   She may have some mild aortic stenosis.  I will get an echocardiogram to evaluate.  Current medicines are reviewed at length with the patient today.  The patient does not have concerns regarding medicines.  The following changes have been made:  no change  Labs/ tests ordered today include:   Orders Placed This Encounter  Procedures  . EKG 12-Lead  . ECHOCARDIOGRAM COMPLETE     Disposition:   FU with me in 24 months.     Signed, Minus Breeding, MD  08/03/2018 10:18 AM    [place]

## 2018-08-02 ENCOUNTER — Telehealth: Payer: Self-pay

## 2018-08-02 NOTE — Telephone Encounter (Signed)
7-22 @920  APPT [facility]  TRIED TO CALL-PHONE JUST KEEPS RINGING-PLEASE ASK COVID/MASK QUESTIONS FOR APPT

## 2018-08-02 NOTE — Telephone Encounter (Signed)

## 2018-08-03 ENCOUNTER — Encounter: Payer: Self-pay | Admitting: Cardiology

## 2018-08-03 ENCOUNTER — Ambulatory Visit: Payer: Federal, State, Local not specified - PPO | Admitting: Cardiology

## 2018-08-03 ENCOUNTER — Other Ambulatory Visit: Payer: Self-pay

## 2018-08-03 VITALS — BP 129/83 | HR 80 | Temp 98.2°F | Ht 64.0 in | Wt 226.8 lb

## 2018-08-03 DIAGNOSIS — I10 Essential (primary) hypertension: Secondary | ICD-10-CM

## 2018-08-03 DIAGNOSIS — R011 Cardiac murmur, unspecified: Secondary | ICD-10-CM

## 2018-08-03 DIAGNOSIS — R002 Palpitations: Secondary | ICD-10-CM | POA: Diagnosis not present

## 2018-08-03 DIAGNOSIS — E663 Overweight: Secondary | ICD-10-CM

## 2018-08-03 MED ORDER — AMLODIPINE BESYLATE 10 MG PO TABS: 10.0000 mg | ORAL_TABLET | Freq: Every day | ORAL | 3 refills | Status: AC

## 2018-08-03 NOTE — Addendum Note (Signed)
Addended by: Waylan Rocher on: 08/03/2018 10:27 AM   Modules accepted: Orders

## 2018-08-03 NOTE — Patient Instructions (Addendum)
Medication Instructions:  The current medical regimen is effective;  continue present plan and medications as directed. Please refer to the Current Medication list given to you today., If you need a refill on your cardiac medications before your next appointment, please call your pharmacy.  Testing/Procedures: Echocardiogram - Your physician has requested that you have an echocardiogram. Echocardiography is a painless test that uses sound waves to create images of your heart. It provides your doctor with information about the size and shape of your heart and how well your heart's chambers and valves are working. This procedure takes approximately one hour. There are no restrictions for this procedure. This will be performed at our [facility] location - [address], [address].  Follow-Up: You will need a follow up appointment in 24 months.  Please call our office 2 months in advance, MAY 2022 to schedule this, July 2022 appointment.  You may see Minus Breeding, MD or one of the following Advanced Practice Providers on your designated Care Team:   Rosaria Ferries, PA-C . Jory Sims, DNP, ANP     At [facility], you and your health needs are our priority.  As part of our continuing mission to provide you with exceptional heart care, we have created designated Provider Care Teams.  These Care Teams include your primary Cardiologist (physician) and Advanced Practice Providers (APPs -  Physician Assistants and Nurse Practitioners) who all work together to provide you with the care you need, when you need it.  Thank you for choosing [facility] at [facility]!!

## 2018-08-09 ENCOUNTER — Other Ambulatory Visit: Payer: Self-pay

## 2018-08-09 ENCOUNTER — Ambulatory Visit (HOSPITAL_COMMUNITY): Payer: Federal, State, Local not specified - PPO | Attending: Internal Medicine

## 2018-08-09 DIAGNOSIS — R011 Cardiac murmur, unspecified: Secondary | ICD-10-CM | POA: Diagnosis not present

## 2018-11-01 DIAGNOSIS — Z1272 Encounter for screening for malignant neoplasm of vagina: Secondary | ICD-10-CM | POA: Diagnosis not present

## 2018-11-01 DIAGNOSIS — T7840XA Allergy, unspecified, initial encounter: Secondary | ICD-10-CM | POA: Diagnosis not present

## 2018-11-01 DIAGNOSIS — Z124 Encounter for screening for malignant neoplasm of cervix: Secondary | ICD-10-CM | POA: Diagnosis not present

## 2018-11-01 DIAGNOSIS — M25562 Pain in left knee: Secondary | ICD-10-CM | POA: Diagnosis not present

## 2018-11-01 DIAGNOSIS — J309 Allergic rhinitis, unspecified: Secondary | ICD-10-CM | POA: Diagnosis not present

## 2018-11-01 DIAGNOSIS — Z23 Encounter for immunization: Secondary | ICD-10-CM | POA: Diagnosis not present

## 2019-03-16 DIAGNOSIS — K5732 Diverticulitis of large intestine without perforation or abscess without bleeding: Secondary | ICD-10-CM | POA: Diagnosis not present

## 2019-03-16 DIAGNOSIS — Z8371 Family history of colonic polyps: Secondary | ICD-10-CM | POA: Diagnosis not present

## 2019-03-16 DIAGNOSIS — K219 Gastro-esophageal reflux disease without esophagitis: Secondary | ICD-10-CM | POA: Diagnosis not present

## 2019-03-23 DIAGNOSIS — Z23 Encounter for immunization: Secondary | ICD-10-CM | POA: Diagnosis not present

## 2019-04-17 DIAGNOSIS — Z1159 Encounter for screening for other viral diseases: Secondary | ICD-10-CM | POA: Diagnosis not present

## 2019-04-20 DIAGNOSIS — Z8371 Family history of colonic polyps: Secondary | ICD-10-CM | POA: Diagnosis not present

## 2019-04-20 DIAGNOSIS — K640 First degree hemorrhoids: Secondary | ICD-10-CM | POA: Diagnosis not present

## 2019-04-20 DIAGNOSIS — K5730 Diverticulosis of large intestine without perforation or abscess without bleeding: Secondary | ICD-10-CM | POA: Diagnosis not present

## 2019-04-20 DIAGNOSIS — Z1211 Encounter for screening for malignant neoplasm of colon: Secondary | ICD-10-CM | POA: Diagnosis not present

## 2019-05-02 DIAGNOSIS — Z23 Encounter for immunization: Secondary | ICD-10-CM | POA: Diagnosis not present

## 2019-06-07 ENCOUNTER — Telehealth: Payer: Self-pay | Admitting: Radiology

## 2019-06-07 ENCOUNTER — Other Ambulatory Visit: Payer: Self-pay

## 2019-06-07 ENCOUNTER — Ambulatory Visit: Payer: Self-pay

## 2019-06-07 ENCOUNTER — Ambulatory Visit: Payer: Federal, State, Local not specified - PPO | Admitting: Orthopedic Surgery

## 2019-06-07 ENCOUNTER — Encounter: Payer: Self-pay | Admitting: Orthopedic Surgery

## 2019-06-07 DIAGNOSIS — G8929 Other chronic pain: Secondary | ICD-10-CM | POA: Diagnosis not present

## 2019-06-07 DIAGNOSIS — M1712 Unilateral primary osteoarthritis, left knee: Secondary | ICD-10-CM | POA: Diagnosis not present

## 2019-06-07 DIAGNOSIS — M25561 Pain in right knee: Secondary | ICD-10-CM | POA: Diagnosis not present

## 2019-06-07 NOTE — Telephone Encounter (Signed)
Please obtain authorization for left knee gel injection for Dr. August Saucer. Thanks.

## 2019-06-08 NOTE — Telephone Encounter (Signed)
Noted  

## 2019-06-12 ENCOUNTER — Encounter: Payer: Self-pay | Admitting: Orthopedic Surgery

## 2019-06-12 DIAGNOSIS — M25561 Pain in right knee: Secondary | ICD-10-CM

## 2019-06-12 MED ORDER — LIDOCAINE HCL 1 % IJ SOLN
5.0000 mL | INTRAMUSCULAR | Status: AC | PRN
  Administered 2019-06-12: 5 mL

## 2019-06-12 MED ORDER — METHYLPREDNISOLONE ACETATE 40 MG/ML IJ SUSP
40.0000 mg | INTRAMUSCULAR | Status: AC | PRN
  Administered 2019-06-12: 40 mg via INTRA_ARTICULAR

## 2019-06-12 MED ORDER — BUPIVACAINE HCL 0.25 % IJ SOLN
4.0000 mL | INTRAMUSCULAR | Status: AC | PRN
  Administered 2019-06-12: 4 mL via INTRA_ARTICULAR

## 2019-06-12 NOTE — Progress Notes (Signed)
Office Visit Note   Patient: Bethany Kemp           Date of Birth: July 19, 1960           MRN: [id number] Visit Date: 06/07/2019 Requested by: No referring provider defined for this encounter. PCP: System, Pcp Not In  Subjective: Chief Complaint  Patient presents with  . Right Knee - Pain  . Left Knee - Pain    HPI: Bethany Kemp is a 59 y.o. female who presents to the office complaining of primarily left knee pain.  Report pain is been ongoing for several years.  Localizes pain to the medial and lateral aspect of the knee.  Denies mechanical symptoms but does report feelings of instability.  Admits to occasional groin pain as well as low back pain with radicular symptoms; his radicular symptoms travel from her low back through her left groin and into the anterior thigh to the top of her foot.  Patient takes over-the-counter Tylenol as well as Mobic, gabapentin, Voltaren gel with little relief.   She notes multiple cortisone injections in the past and one gel injection.  The gel injection provided more relief.  Pain does wake her up at night but does not bother her at rest.  She has no history of left knee surgery.  Her right knee bothers her some days but overall is well controlled.              ROS:  All systems reviewed are negative as they relate to the chief complaint within the history of present illness.  Patient denies fevers or chills.  Assessment & Plan: Visit Diagnoses:  1. Chronic pain of right knee   2. Unilateral primary osteoarthritis, left knee     Plan: Patient is a 60 year old female who presents complaining of bilateral knee pain.  Primarily worse in the left knee.  Her right knee is pretty well controlled.  She has had multiple cortisone injections in the past which have lost some of their efficacy.  She has had one gel injection that provided better relief from the cortisone injections.  She has left knee pain that wakes her up from sleep at night and occasionally  wants to give out on her.  She is taking medications without significant relief.  A left knee cortisone injection was administered, per patient's request.  Patient tolerated the procedure well.  Also we will preapproved patient for gel injections given her good relief in the past.  Follow-up as needed.  This patient is diagnosed with osteoarthritis of the knee(s).    Radiographs show evidence of joint space narrowing, osteophytes, subchondral sclerosis and/or subchondral cysts.  This patient has knee pain which interferes with functional and activities of daily living.    This patient has experienced inadequate response, adverse effects and/or intolerance with conservative treatments such as acetaminophen, NSAIDS, topical creams, physical therapy or regular exercise, knee bracing and/or weight loss.   This patient has experienced inadequate response or has a contraindication to intra articular steroid injections for at least 3 months.   This patient is not scheduled to have a total knee replacement within 6 months of starting treatment with viscosupplementation.   Follow-Up Instructions: No follow-ups on file.   Orders:  Orders Placed This Encounter  Procedures  . XR KNEE 3 VIEW RIGHT  . XR KNEE 3 VIEW LEFT   No orders of the defined types were placed in this encounter.     Procedures: Large Joint Inj: R  knee on 06/12/2019 9:52 PM Indications: diagnostic evaluation, joint swelling and pain Details: 18 G 1.5 in needle, superolateral approach  Arthrogram: No  Medications: 5 mL lidocaine 1 %; 40 mg methylPREDNISolone acetate 40 MG/ML; 4 mL bupivacaine 0.25 % Outcome: tolerated well, no immediate complications Procedure, treatment alternatives, risks and benefits explained, specific risks discussed. Consent was given by the patient. Immediately prior to procedure a time out was called to verify the correct patient, procedure, equipment, support staff and site/side marked as required.  Patient was prepped and draped in the usual sterile fashion.       Clinical Data: No additional findings.  Objective: Vital Signs: There were no vitals taken for this visit.  Physical Exam:  Constitutional: Patient appears well-developed HEENT:  Head: Normocephalic Eyes:EOM are normal Neck: Normal range of motion Cardiovascular: Normal rate Pulmonary/chest: Effort normal Neurologic: Patient is alert Skin: Skin is warm Psychiatric: Patient has normal mood and affect  Ortho Exam:  Left knee Exam No effusion Tender to palpation through the medial and lateral joint lines Extensor mechanism intact No TTP over the quad tendon, patellar tendon, pes anserinus, patella, tibial tubercle, LCL/MCL insertions Stable to varus/valgus stresses.  Stable to anterior/posterior drawer Extension to 0 degrees Flexion > 90 degrees No pain with hip range of motion.  Negative straight leg raise.  Specialty Comments:  No specialty comments available.  Imaging: No results found.   PMFS History: Patient Active Problem List   Diagnosis Date Noted  . Unilateral primary osteoarthritis, left knee 05/07/2016  . Abnormal stress ECG with treadmill 07/30/2014  . Palpitation 06/22/2014  . Dyspnea 06/22/2014   Past Medical History:  Diagnosis Date  . Asthma   . Gastritis    1980's was hospitalized  . GERD (gastroesophageal reflux disease)   . Heart murmur   . Hypertension   . Obesities, morbid ([place])   . Sleep apnea    BiPap    Family History  Problem Relation Age of Onset  . Asthma Father   . Heart attack Mother 56       Died with MI age 70  . Arthritis Mother   . Diabetes Mother   . Stroke Sister   . Breast cancer Sister   . Asthma Daughter     Past Surgical History:  Procedure Laterality Date  . BREAST REDUCTION SURGERY    . BUNIONECTOMY  1993   bilateral  . CARDIAC CATHETERIZATION N/A 07/30/2014   Procedure: Left Heart Cath and Coronary Angiography;  Surgeon: Sherren Mocha, MD;  Location: [place] CV LAB;  Service: Cardiovascular;  Laterality: N/A;  . CHOLECYSTECTOMY  2005  . HERNIA REPAIR  1982  . KNEE SURGERY  2003   Righ knee -- chondal implant   Social History   Occupational History  . Not on file  Tobacco Use  . Smoking status: Never Smoker  . Smokeless tobacco: Never Used  Substance and Sexual Activity  . Alcohol use: No  . Drug use: No  . Sexual activity: Not on file

## 2019-06-15 ENCOUNTER — Telehealth: Payer: Self-pay

## 2019-06-15 NOTE — Telephone Encounter (Signed)
Submitted VOB for SynviscOne, left knee. 

## 2019-06-21 ENCOUNTER — Telehealth: Payer: Self-pay

## 2019-06-21 NOTE — Telephone Encounter (Signed)
Patient aware that she is approved for gel injection.  Approved, SynviscOne, left knee. Buy & Bill Must meet deductible first Patient will be responsible for 15% OOP. Co-pay of $35.00 No PA required

## 2019-06-26 DIAGNOSIS — Z9989 Dependence on other enabling machines and devices: Secondary | ICD-10-CM | POA: Diagnosis not present

## 2019-06-26 DIAGNOSIS — G4733 Obstructive sleep apnea (adult) (pediatric): Secondary | ICD-10-CM | POA: Diagnosis not present

## 2019-06-28 ENCOUNTER — Ambulatory Visit: Payer: Federal, State, Local not specified - PPO | Admitting: Orthopedic Surgery

## 2019-06-28 ENCOUNTER — Encounter: Payer: Self-pay | Admitting: Orthopedic Surgery

## 2019-06-28 DIAGNOSIS — M1712 Unilateral primary osteoarthritis, left knee: Secondary | ICD-10-CM | POA: Diagnosis not present

## 2019-06-28 MED ORDER — LIDOCAINE HCL 1 % IJ SOLN
5.0000 mL | INTRAMUSCULAR | Status: AC | PRN
  Administered 2019-06-28: 5 mL

## 2019-06-28 MED ORDER — HYLAN G-F 20 48 MG/6ML IX SOSY
48.0000 mg | PREFILLED_SYRINGE | INTRA_ARTICULAR | Status: AC | PRN
  Administered 2019-06-28: 48 mg via INTRA_ARTICULAR

## 2019-06-28 NOTE — Progress Notes (Signed)
   Procedure Note  Patient: Bethany Kemp             Date of Birth: 03-13-1960           MRN: [id number]             Visit Date: 06/28/2019  Procedures: Visit Diagnoses:  1. Unilateral primary osteoarthritis, left knee     Large Joint Inj: L knee on 06/28/2019 1:37 PM Indications: pain, joint swelling and diagnostic evaluation Details: 18 G 1.5 in needle, superolateral approach  Arthrogram: No  Medications: 5 mL lidocaine 1 %; 48 mg Hylan 48 MG/6ML Outcome: tolerated well, no immediate complications Procedure, treatment alternatives, risks and benefits explained, specific risks discussed. Consent was given by the patient. Immediately prior to procedure a time out was called to verify the correct patient, procedure, equipment, support staff and site/side marked as required. Patient was prepped and draped in the usual sterile fashion.

## 2019-09-14 DIAGNOSIS — K080 Exfoliation of teeth due to systemic causes: Secondary | ICD-10-CM | POA: Diagnosis not present

## 2020-01-23 DIAGNOSIS — R194 Change in bowel habit: Secondary | ICD-10-CM | POA: Diagnosis not present

## 2020-01-23 DIAGNOSIS — R1032 Left lower quadrant pain: Secondary | ICD-10-CM | POA: Diagnosis not present

## 2020-01-23 DIAGNOSIS — K5900 Constipation, unspecified: Secondary | ICD-10-CM | POA: Diagnosis not present

## 2020-02-07 DIAGNOSIS — G4733 Obstructive sleep apnea (adult) (pediatric): Secondary | ICD-10-CM | POA: Diagnosis not present

## 2020-02-07 DIAGNOSIS — K581 Irritable bowel syndrome with constipation: Secondary | ICD-10-CM | POA: Diagnosis not present

## 2020-03-12 DIAGNOSIS — Z803 Family history of malignant neoplasm of breast: Secondary | ICD-10-CM | POA: Diagnosis not present

## 2020-03-12 DIAGNOSIS — Z1231 Encounter for screening mammogram for malignant neoplasm of breast: Secondary | ICD-10-CM | POA: Diagnosis not present

## 2020-04-29 ENCOUNTER — Ambulatory Visit: Payer: Self-pay

## 2020-04-29 ENCOUNTER — Ambulatory Visit: Payer: Federal, State, Local not specified - PPO | Admitting: Orthopedic Surgery

## 2020-04-29 DIAGNOSIS — M1712 Unilateral primary osteoarthritis, left knee: Secondary | ICD-10-CM | POA: Diagnosis not present

## 2020-04-29 MED ORDER — PREDNISONE 5 MG (21) PO TBPK
ORAL_TABLET | ORAL | 0 refills | Status: DC
Start: 2020-04-29 — End: 2020-07-31

## 2020-05-05 ENCOUNTER — Encounter: Payer: Self-pay | Admitting: Orthopedic Surgery

## 2020-05-05 NOTE — Progress Notes (Signed)
Office Visit Note   Patient: Bethany Kemp           Date of Birth: 10-19-1960           MRN: [id number] Visit Date: 04/29/2020 Requested by: No referring provider defined for this encounter. PCP: Pcp, No  Subjective: Chief Complaint  Patient presents with  . Left Knee - Pain, Follow-up    Patient reports that left knee is worse and wants to discuss options.     HPI: Bethany Kemp is a 60 year old patient with severe left knee pain.  Hard for her to weight-bear at times.  She describes decreased walking endurance less than 1 mile.  She has been doing the bike for strengthening exercises.  Describes a lot of mechanical symptoms and popping.  Takes Mobic and Neurontin.  Prednisone does ease the pain.  Primarily medially on that left knee is where the pain is most severe.  She reports some low back pain as well.  She does have a history of DVT on the right side.  Also had a problem with nausea after prior anesthetics.              ROS: All systems reviewed are negative as they relate to the chief complaint within the history of present illness.  Patient denies  fevers or chills.   Assessment & Plan: Visit Diagnoses:  1. Unilateral primary osteoarthritis, left knee     Plan: Impression is end-stage left knee arthritis.  Radiographs show severe arthritis with large osteophyte medial femoral condyle posteriorly.  Plan is left total knee replacement with press-fit components possibly utilized.  Risk and benefits are discussed with the patient including but not limited to infection nerve vessel damage knee stiffness incomplete pain relief as well as potential need for revision in her lifetime.  Patient understands risk and benefits.  All questions answered.  Will prescribe Medrol Dosepak 6-day course for temporary relief.  Would not favor cortisone injection this close to the time of surgery which will be in July.  Follow-Up Instructions: No follow-ups on file.   Orders:  Orders Placed This  Encounter  Procedures  . XR KNEE 3 VIEW LEFT   Meds ordered this encounter  Medications  . predniSONE (STERAPRED UNI-PAK 21 TAB) 5 MG (21) TBPK tablet    Sig: Take dose pak as directed    Dispense:  21 tablet    Refill:  0      Procedures: No procedures performed   Clinical Data: No additional findings.  Objective: Vital Signs: There were no vitals taken for this visit.  Physical Exam:   Constitutional: Patient appears well-developed HEENT:  Head: Normocephalic Eyes:EOM are normal Neck: Normal range of motion Cardiovascular: Normal rate Pulmonary/chest: Effort normal Neurologic: Patient is alert Skin: Skin is warm Psychiatric: Patient has normal mood and affect    Ortho Exam: Ortho exam demonstrates full active and passive range of motion of the hips and ankles.  Left knee has range of motion about 5-1 05.  Collateral crucial ligaments are stable extensor mechanism is intact.  Patellofemoral crepitus is present.  Mild effusion is present.  DP pulses 2+ out of 4.  Ankle dorsiflexion intact.  We will plan to use Xarelto beginning day of surgery for DVT prophylaxis due to her history.  Specialty Comments:  No specialty comments available.  Imaging: No results found.   PMFS History: Patient Active Problem List   Diagnosis Date Noted  . Unilateral primary osteoarthritis, left knee 05/07/2016  .  Abnormal stress ECG with treadmill 07/30/2014  . Palpitation 06/22/2014  . Dyspnea 06/22/2014   Past Medical History:  Diagnosis Date  . Asthma   . Gastritis    1980's was hospitalized  . GERD (gastroesophageal reflux disease)   . Heart murmur   . Hypertension   . Obesities, morbid (HCC)   . Sleep apnea    BiPap    Family History  Problem Relation Age of Onset  . Asthma Father   . Heart attack Mother 110       Died with MI age 83  . Arthritis Mother   . Diabetes Mother   . Stroke Sister   . Breast cancer Sister   . Asthma Daughter     Past Surgical  History:  Procedure Laterality Date  . BREAST REDUCTION SURGERY    . BUNIONECTOMY  1993   bilateral  . CARDIAC CATHETERIZATION N/A 07/30/2014   Procedure: Left Heart Cath and Coronary Angiography;  Surgeon: Tonny Bollman, MD;  Location:  [facility] INVASIVE CV LAB;  Service: Cardiovascular;  Laterality: N/A;  . CHOLECYSTECTOMY  2005  . HERNIA REPAIR  1982  . KNEE SURGERY  2003   Righ knee -- chondal implant   Social History   Occupational History  . Not on file  Tobacco Use  . Smoking status: Never Smoker  . Smokeless tobacco: Never Used  Substance and Sexual Activity  . Alcohol use: No  . Drug use: No  . Sexual activity: Not on file

## 2020-05-28 DIAGNOSIS — R309 Painful micturition, unspecified: Secondary | ICD-10-CM | POA: Diagnosis not present

## 2020-06-18 ENCOUNTER — Other Ambulatory Visit: Payer: Self-pay

## 2020-06-25 ENCOUNTER — Ambulatory Visit
Admission: EM | Admit: 2020-06-25 | Discharge: 2020-06-25 | Disposition: A | Discharge status: Home / Self Care | Payer: Federal, State, Local not specified - PPO | Attending: Family Medicine | Admitting: Family Medicine

## 2020-06-25 ENCOUNTER — Other Ambulatory Visit: Payer: Self-pay

## 2020-06-25 DIAGNOSIS — N760 Acute vaginitis: Secondary | ICD-10-CM | POA: Diagnosis not present

## 2020-06-25 DIAGNOSIS — R1032 Left lower quadrant pain: Secondary | ICD-10-CM | POA: Insufficient documentation

## 2020-06-25 DIAGNOSIS — K5732 Diverticulitis of large intestine without perforation or abscess without bleeding: Secondary | ICD-10-CM | POA: Diagnosis not present

## 2020-06-25 HISTORY — DX: Diverticulitis of intestine, part unspecified, without perforation or abscess without bleeding: K57.92

## 2020-06-25 LAB — POCT URINALYSIS DIP (MANUAL ENTRY)
Bilirubin, UA: NEGATIVE
Glucose, UA: NEGATIVE mg/dL
Ketones, POC UA: NEGATIVE mg/dL
Leukocytes, UA: NEGATIVE
Nitrite, UA: NEGATIVE
Spec Grav, UA: >=1.03 — AB (ref 1.010–1.025)
Urobilinogen, UA: 0.2 E.U./dL
pH, UA: 5.5 (ref 5.0–8.0)

## 2020-06-25 LAB — POCT URINE PREGNANCY: Preg Test, Ur: NEGATIVE

## 2020-06-25 MED ORDER — METRONIDAZOLE 500 MG PO TABS: 500.0000 mg | ORAL_TABLET | Freq: Two times a day (BID) | ORAL | 0 refills | Status: DC

## 2020-06-25 MED ORDER — CIPROFLOXACIN HCL 500 MG PO TABS: 500.0000 mg | ORAL_TABLET | Freq: Two times a day (BID) | ORAL | 0 refills | Status: AC

## 2020-06-25 MED ORDER — ONDANSETRON HCL 4 MG PO TABS: 4.0000 mg | ORAL_TABLET | Freq: Four times a day (QID) | ORAL | 0 refills | Status: DC

## 2020-06-25 NOTE — ED Provider Notes (Signed)
[name]    CSN: [id number] Arrival date & time: 06/25/20  0850      History   Chief Complaint Chief Complaint  Patient presents with   Abdominal Pain   Emesis    HPI Bethany Kemp is a 60 y.o. female.   HPI Patient presents today with a known history of diverticulitis.  She is concerned as she is having left lower abdominal pain.  She reports overnight the pain became rather intense and sharp.  She has had nausea with one episode of vomiting.  She also wishes to have cytology completed today and reports a history of urinary tract infections and would like to also have her urine checked although she denies any dysuria symptoms.  Past Medical History:  Diagnosis Date   Asthma    Diverticulitis    Gastritis    1980's was hospitalized   GERD (gastroesophageal reflux disease)    Heart murmur    Hypertension    Obesities, morbid (HCC)    Sleep apnea    BiPap    Patient Active Problem List   Diagnosis Date Noted   Unilateral primary osteoarthritis, left knee 05/07/2016   Abnormal stress ECG with treadmill 07/30/2014   Palpitation 06/22/2014   Dyspnea 06/22/2014    Past Surgical History:  Procedure Laterality Date   BREAST REDUCTION SURGERY     BUNIONECTOMY  1993   bilateral   CARDIAC CATHETERIZATION N/A 07/30/2014   Procedure: Left Heart Cath and Coronary Angiography;  Surgeon: Tonny BollmanMichael Cooper, MD;  [facility];  Service: Cardiovascular;  Laterality: N/A;   CHOLECYSTECTOMY  2005   HERNIA REPAIR  1982   KNEE SURGERY  2003   Righ knee -- chondal implant    OB History   No obstetric history on file.      Home Medications    Prior to Admission medications   Medication Sig Start Date End Date Taking? Authorizing Provider  ciprofloxacin (CIPRO) 500 MG tablet Take 1 tablet (500 mg total) by mouth 2 (two) times daily for 5 days. 06/25/20 06/30/20 Yes Bing NeighborsHarris, Stetson Pelaez S, FNP  metroNIDAZOLE (FLAGYL) 500 MG tablet Take 1 tablet (500 mg  total) by mouth 2 (two) times daily with a meal. DO NOT CONSUME ALCOHOL WHILE TAKING THIS MEDICATION. 06/25/20  Yes Bing NeighborsHarris, Tura Roller S, FNP  ondansetron (ZOFRAN) 4 MG tablet Take 1 tablet (4 mg total) by mouth every 6 (six) hours. 06/25/20  Yes Bing NeighborsHarris, Vayda Dungee S, FNP  albuterol (PROVENTIL HFA;VENTOLIN HFA) 108 (90 BASE) MCG/ACT inhaler Inhale 2 puffs into the lungs every 4 (four) hours as needed for wheezing or shortness of breath.    [provider]  amitriptyline (ELAVIL) 25 MG tablet Take 75 mg by mouth at bedtime.    [provider]  amLODipine (NORVASC) 10 MG tablet Take 1 tablet (10 mg total) by mouth daily. 08/03/18   Rollene RotundaHochrein, James, MD  aspirin 81 MG tablet Take 81 mg by mouth daily.    [provider]  atenolol (TENORMIN) 25 MG tablet Take 1 tablet (25 mg total) by mouth every morning. 07/25/14   Rollene RotundaHochrein, James, MD  atenolol (TENORMIN) 50 MG tablet Take 50 mg by mouth at bedtime.     [provider]  cholecalciferol (VITAMIN D) 25 MCG (1000 UNIT) tablet  01/16/19   [provider]  diclofenac Sodium (VOLTAREN) 1 % GEL  03/02/19   [provider]  dicyclomine (BENTYL) 20 MG tablet Take 20 mg by mouth 3 (  three) times daily. 06/05/19   [provider]  fluticasone Aleda Grana) 50 MCG/ACT nasal spray  03/02/19   [provider]  Fluticasone-Salmeterol (ADVAIR HFA IN) Inhale into the lungs daily.    [provider]  gabapentin (NEURONTIN) 400 MG capsule Take 400 mg by mouth at bedtime.    [provider]  hydrochlorothiazide (HYDRODIURIL) 25 MG tablet Take 1 tablet by mouth as needed. Take 0.5 tab daily as needed 08/12/14   [provider]  loratadine (CLARITIN) 10 MG tablet Take 10 mg by mouth daily.    [provider]  losartan (COZAAR) 50 MG tablet Take 50 mg by mouth daily.    [provider]  meloxicam (MOBIC) 15 MG tablet Take 1 tablet by mouth daily. 07/04/18   [provider]  mometasone (ASMANEX) 220 MCG/INH inhaler Inhale 2 puffs into the lungs at bedtime.    [provider]  omeprazole (PRILOSEC) 20 MG capsule Take 1 capsule by mouth 2 (two) times daily. Take 1 tab twice a day 08/12/14   [provider]  predniSONE (STERAPRED UNI-PAK 21 TAB) 5 MG (21) TBPK tablet Take dose pak as directed 04/29/20   [name], MD  Vitamin D, Ergocalciferol, (DRISDOL) 50000 units CAPS capsule Take 1 capsule by mouth once a week. Take 1 cap by mouth once a week 02/21/15   [provider]    Family History Family History  Problem Relation Age of Onset   Asthma Father    Heart attack Mother 21       Died with MI age 50   Arthritis Mother    Diabetes Mother    Stroke Sister    Breast cancer Sister    Asthma Daughter     Social History Social History   Tobacco Use   Smoking status: Never   Smokeless tobacco: Never  Substance Use Topics   Alcohol use: No   Drug use: No     Allergies   Ibuprofen, Lisinopril, Acetaminophen, Aspirin, and Spironolactone   Review of Systems Review of Systems Pertinent negatives listed in HPI   Physical Exam Triage Vital Signs ED Triage Vitals  Enc Vitals Group     BP 06/25/20 1014 139/85     Pulse Rate 06/25/20 1014 86     Resp 06/25/20 1014 16     Temp 06/25/20 1014 98.3 F (36.8 C)     Temp Source 06/25/20 1014 Oral     SpO2 06/25/20 1014 94 %     Weight --      Height --      Head Circumference --      Peak Flow --      Pain Score 06/25/20 1013 4     Pain Loc --      Pain Edu? --      Excl. in GC? --    No data found.  Updated Vital Signs BP 139/85 (BP Location: Left Arm)   Pulse 86   Temp 98.3 F (36.8 C) (Oral)   Resp 16   LMP 01/13/2015   SpO2 94%   Visual Acuity Right Eye Distance:   Left Eye Distance:   Bilateral Distance:    Right Eye Near:   Left Eye Near:    Bilateral Near:     Physical Exam Cardiovascular:     Rate and Rhythm: Normal rate.   Pulmonary:     Effort: Pulmonary effort is normal.     Breath sounds: Normal breath  sounds.  Abdominal:     General: Bowel sounds are normal.     Palpations: Abdomen is soft.     Tenderness: There is abdominal tenderness in the left lower quadrant. There is no guarding or rebound.  Genitourinary:    Comments: Vaginal cytology self collected  Skin:    General: Skin is warm.     Capillary Refill: Capillary refill takes less than 2 seconds.  Neurological:     General: No focal deficit present.     Mental Status: She is alert.  Psychiatric:        Mood and Affect: Mood normal.     UC Treatments / Results  Labs (all labs ordered are listed, but only abnormal results are displayed) Labs Reviewed  POCT URINALYSIS DIP (MANUAL ENTRY) - Abnormal; Notable for the following components:      Result Value   Spec Grav, UA >=1.030 (*)    Blood, UA moderate (*)    Protein Ur, POC trace (*)    All other components within normal limits  URINE CULTURE  POCT URINE PREGNANCY    EKG   Radiology No results found.  Procedures Procedures (including critical care time)  Medications Ordered in UC Medications - No data to display  Initial Impression / Assessment and Plan / UC Course  I have reviewed the triage vital signs and the nursing notes.  Pertinent labs & imaging results that were available during my care of the patient were reviewed by me and considered in my medical decision making (see chart for details).    Patient presents today with left lower quadrant abdominal pain.  Patient has a history of diverticulitis therefore we will treat empirically for diverticulitis of the colon.  UA unremarkable however will culture urine to rule out active infection.  Metronidazole will take care of any bacterial vaginosis as patient is having some vaginitis type symptoms.  Will await cytology prior to prescribing any additional medications.  Return precautions given.  Strict ER precautions given  as discussed with patient unable to definitively diagnose diverticulitis without a CT of the abdomen however given history after review of prior EMR records we will treat empirically and cover for diverticulitis.  Patient verbalized understanding and agreement with today's plan.  Final Clinical Impressions(s) / UC Diagnoses   Final diagnoses:  Diverticulitis of colon  Abdominal pain, left lower quadrant  Vaginitis and vulvovaginitis     Discharge Instructions      Your vaginal cytology will result within the next 3 to 5 days.  We will contact you if any treatment is warranted.  If you do not hear anything from [place] it just means that your cytology was negative. Treating today for diverticulitis.  You will take Cipro and Flagyl together complete both courses of medication.  I have also sent over Zofran for nausea.  If you experience any worsening persistent vomiting or fever go immediately to the emergency department as this can be a medical emergency related to severe diverticulitis.   ED Prescriptions     Medication Sig Dispense Auth. Provider   metroNIDAZOLE (FLAGYL) 500 MG tablet Take 1 tablet (500 mg total) by mouth 2 (two) times daily with a meal. DO NOT CONSUME ALCOHOL WHILE TAKING THIS MEDICATION. 14 tablet Bing Neighbors, FNP   ciprofloxacin (CIPRO) 500 MG tablet Take 1 tablet (500 mg total) by mouth 2 (two) times daily for 5 days. 10 tablet Bing Neighbors, FNP   ondansetron (ZOFRAN) 4 MG tablet Take 1  tablet (4 mg total) by mouth every 6 (six) hours. 12 tablet Bing Neighbors, FNP      PDMP not reviewed this encounter.   Bing Neighbors, [place] 06/30/20 [phone]

## 2020-06-25 NOTE — Discharge Instructions (Addendum)
Your vaginal cytology will result within the next 3 to 5 days.  We will contact you if any treatment is warranted.  If you do not hear anything from [place] it just means that your cytology was negative. Treating today for diverticulitis.  You will take Cipro and Flagyl together complete both courses of medication.  I have also sent over Zofran for nausea.  If you experience any worsening persistent vomiting or fever go immediately to the emergency department as this can be a medical emergency related to severe diverticulitis.

## 2020-06-25 NOTE — ED Triage Notes (Addendum)
Patient presents to Urgent Care with complaints of lower back pain, abdominal pain x 2 days. Pt states one vomiting episode today. She reports hx of diverticulitis and UTIs. Pt wants to rule out pregnancy, UTI, and STDSs.    Denies fever, hematuria.

## 2020-06-26 LAB — CERVICOVAGINAL ANCILLARY ONLY
Bacterial Vaginitis (gardnerella): POSITIVE — AB
Candida Glabrata: NEGATIVE
Candida Vaginitis: NEGATIVE
Comment: NEGATIVE
Comment: NEGATIVE
Comment: NEGATIVE
Comment: NEGATIVE
Trichomonas: NEGATIVE

## 2020-06-26 LAB — URINE CULTURE
Culture: NO GROWTH
Special Requests: NORMAL

## 2020-06-29 DIAGNOSIS — E669 Obesity, unspecified: Secondary | ICD-10-CM | POA: Diagnosis not present

## 2020-06-29 DIAGNOSIS — R7303 Prediabetes: Secondary | ICD-10-CM | POA: Diagnosis not present

## 2020-06-29 DIAGNOSIS — E785 Hyperlipidemia, unspecified: Secondary | ICD-10-CM | POA: Diagnosis not present

## 2020-07-03 DIAGNOSIS — H527 Unspecified disorder of refraction: Secondary | ICD-10-CM | POA: Diagnosis not present

## 2020-07-03 DIAGNOSIS — E785 Hyperlipidemia, unspecified: Secondary | ICD-10-CM | POA: Diagnosis not present

## 2020-07-03 DIAGNOSIS — E669 Obesity, unspecified: Secondary | ICD-10-CM | POA: Diagnosis not present

## 2020-07-03 DIAGNOSIS — R7303 Prediabetes: Secondary | ICD-10-CM | POA: Diagnosis not present

## 2020-07-03 DIAGNOSIS — Z6838 Body mass index (BMI) 38.0-38.9, adult: Secondary | ICD-10-CM | POA: Diagnosis not present

## 2020-07-19 ENCOUNTER — Other Ambulatory Visit (HOSPITAL_COMMUNITY): Payer: Federal, State, Local not specified - PPO

## 2020-07-22 NOTE — Pre-Procedure Instructions (Signed)
Surgical Instructions    Your procedure is scheduled on 07/30/2020.  Report to [facility] Main Entrance "A" at 0530 A.M., then check in with the Admitting office.  Call this number if you have problems the morning of surgery:  [phone]   If you have any questions prior to your surgery date call [phone]: Open Monday-Friday 8am-4pm    Remember:  Do not eat after midnight the night before your surgery  You may drink clear liquids until 0430 the morning of your surgery.   Clear liquids allowed are: Water, Non-Citrus Juices (without pulp), Carbonated Beverages, Clear Tea, Black Coffee Only, and Gatorade  Enhanced Recovery after Surgery for Orthopedics Enhanced Recovery after Surgery is a protocol used to improve the stress on your body and your recovery after surgery.  Patient Instructions  The day of surgery (if you do NOT have diabetes):  Drink ONE (1) Pre-Surgery Clear Ensure by 0430 am the morning of surgery   This drink was given to you during your hospital  pre-op appointment visit. Nothing else to drink after completing the  Pre-Surgery Clear Ensure.        If you have questions, please contact your surgeon's office.     Take these medicines the morning of surgery with A SIP OF WATER   albuterol (PROVENTIL HFA;VENTOLIN HFA) 108 (90 BASE) MCG/ACT inhaler if needed amLODipine (NORVASC) 10 MG tablet atenolol (TENORMIN) 50 MG tablet fluticasone (FLONASE) 50 MCG/ACT nasal spray if needed. hydroxypropyl methylcellulose / hypromellose (ISOPTO TEARS / GONIOVISC) 2.5 % ophthalmic solution loratadine (CLARITIN) 10 MG tablet if needed omeprazole (PRILOSEC) 20 MG capsule predniSONE (DELTASONE) 5 MG tablet  Please bring all inhalers with you the day of surgery.   As of today, STOP taking any Aspirin (unless otherwise instructed by your surgeon) Aleve, Naproxen, Ibuprofen, Motrin, Advil, Goody's, BC's, all herbal medications, fish oil, and all vitamins.          Do not wear  jewelry or makeup Do not wear lotions, powders, perfumes/colognes, or deodorant. Do not shave 48 hours prior to surgery.  Men may shave face and neck. Do not bring valuables to the hospital. DO Not wear nail polish, gel polish, artificial nails, or any other type of covering on  natural nails including finger and toenails. If patients have artificial nails, gel coating, etc. that need to be removed by a nail salon please have this removed prior to surgery or surgery may need to be canceled/delayed if the surgeon/ anesthesia feels like the patient is unable to be adequately monitored.             [facility] is not responsible for any belongings or valuables.  Do NOT Smoke (Tobacco/Vaping) or drink Alcohol 24 hours prior to your procedure If you use a CPAP at night, you may bring all equipment for your overnight stay.   Contacts, glasses, dentures or bridgework may not be worn into surgery, please bring cases for these belongings   For patients admitted to the hospital, discharge time will be determined by your treatment team.   Patients discharged the day of surgery will not be allowed to drive home, and someone needs to stay with them for 24 hours.  ONLY 1 SUPPORT PERSON MAY BE PRESENT WHILE YOU ARE IN SURGERY. IF YOU ARE TO BE ADMITTED ONCE YOU ARE IN YOUR ROOM YOU WILL BE ALLOWED TWO (2) VISITORS.  Minor children may have two parents present. Special consideration for safety and communication needs will be reviewed on  a case by case basis.  Special instructions:    Oral Hygiene is also important to reduce your risk of infection.  Remember - BRUSH YOUR TEETH THE MORNING OF SURGERY WITH YOUR REGULAR TOOTHPASTE   [facility]- Preparing For Surgery  Before surgery, you can play an important role. Because skin is not sterile, your skin needs to be as free of germs as possible. You can reduce the number of germs on your skin by washing with CHG (chlorahexidine gluconate) Soap before surgery.   CHG is an antiseptic cleaner which kills germs and bonds with the skin to continue killing germs even after washing.     Please do not use if you have an allergy to CHG or antibacterial soaps. If your skin becomes reddened/irritated stop using the CHG.  Do not shave (including legs and underarms) for at least 48 hours prior to first CHG shower. It is OK to shave your face.  Please follow these instructions carefully.     Shower the NIGHT BEFORE SURGERY and the MORNING OF SURGERY with CHG Soap.   If you chose to wash your hair, wash your hair first as usual with your normal shampoo. After you shampoo, rinse your hair and body thoroughly to remove the shampoo.  Then [employer] and genitals (private parts) with your normal soap and rinse thoroughly to remove soap.  After that Use CHG Soap as you would any other liquid soap. You can apply CHG directly to the skin and wash gently with a scrungie or a clean washcloth.   Apply the CHG Soap to your body ONLY FROM THE NECK DOWN.  Do not use on open wounds or open sores. Avoid contact with your eyes, ears, mouth and genitals (private parts). Wash Face and genitals (private parts)  with your normal soap.   Wash thoroughly, paying special attention to the area where your surgery will be performed.  Thoroughly rinse your body with warm water from the neck down.  DO NOT shower/wash with your normal soap after using and rinsing off the CHG Soap.  Pat yourself dry with a CLEAN TOWEL.  Wear CLEAN PAJAMAS to bed the night before surgery  Place CLEAN SHEETS on your bed the night before your surgery  DO NOT SLEEP WITH PETS.   Day of Surgery:  Take a shower with CHG soap. Wear Clean/Comfortable clothing the morning of surgery Do not apply any deodorants/lotions.   Remember to brush your teeth WITH YOUR REGULAR TOOTHPASTE.   Please read over the following fact sheets that you were given.

## 2020-07-23 ENCOUNTER — Encounter (HOSPITAL_COMMUNITY)
Admission: RE | Admit: 2020-07-23 | Discharge: 2020-07-23 | Disposition: A | Discharge status: Home / Self Care | Payer: Federal, State, Local not specified - PPO | Source: Ambulatory Visit | Attending: Orthopedic Surgery | Admitting: Orthopedic Surgery

## 2020-07-23 ENCOUNTER — Encounter (HOSPITAL_COMMUNITY): Payer: Self-pay

## 2020-07-23 ENCOUNTER — Other Ambulatory Visit: Payer: Self-pay

## 2020-07-23 DIAGNOSIS — Z01818 Encounter for other preprocedural examination: Secondary | ICD-10-CM | POA: Diagnosis not present

## 2020-07-23 HISTORY — DX: Nausea with vomiting, unspecified: R11.2

## 2020-07-23 HISTORY — DX: Other complications of anesthesia, initial encounter: T88.59XA

## 2020-07-23 HISTORY — DX: Anemia, unspecified: D64.9

## 2020-07-23 HISTORY — DX: Unspecified osteoarthritis, unspecified site: M19.90

## 2020-07-23 HISTORY — DX: Family history of other specified conditions: Z84.89

## 2020-07-23 HISTORY — DX: Prediabetes: R73.03

## 2020-07-23 HISTORY — DX: Other specified postprocedural states: Z98.890

## 2020-07-23 HISTORY — DX: Personal history of other diseases of the digestive system: Z87.19

## 2020-07-23 HISTORY — DX: Acute embolism and thrombosis of unspecified deep veins of unspecified lower extremity: I82.409

## 2020-07-23 LAB — URINALYSIS, ROUTINE W REFLEX MICROSCOPIC
Bilirubin Urine: NEGATIVE
Glucose, UA: NEGATIVE mg/dL
Ketones, ur: NEGATIVE mg/dL
Leukocytes,Ua: NEGATIVE
Nitrite: NEGATIVE
Protein, ur: NEGATIVE mg/dL
Specific Gravity, Urine: 1.023 (ref 1.005–1.030)
pH: 6 (ref 5.0–8.0)

## 2020-07-23 LAB — GLUCOSE, CAPILLARY: Glucose-Capillary: 100 mg/dL — ABNORMAL HIGH (ref 70–99)

## 2020-07-23 LAB — CBC
HCT: 36.2 % (ref 36.0–46.0)
Hemoglobin: 11.7 g/dL — ABNORMAL LOW (ref 12.0–15.0)
MCH: 28.4 pg (ref 26.0–34.0)
MCHC: 32.3 g/dL (ref 30.0–36.0)
MCV: 87.9 fL (ref 80.0–100.0)
Platelets: 330 10*3/uL (ref 150–400)
RBC: 4.12 MIL/uL (ref 3.87–5.11)
RDW: 14.3 % (ref 11.5–15.5)
WBC: 5.7 10*3/uL (ref 4.0–10.5)
nRBC: 0 % (ref 0.0–0.2)

## 2020-07-23 LAB — BASIC METABOLIC PANEL
Anion gap: 8 (ref 5–15)
BUN: 9 mg/dL (ref 6–20)
CO2: 30 mmol/L (ref 22–32)
Calcium: 9.1 mg/dL (ref 8.9–10.3)
Chloride: 102 mmol/L (ref 98–111)
Creatinine, Ser: 0.6 mg/dL (ref 0.44–1.00)
GFR, Estimated: >60 mL/min (ref >60)
Glucose, Bld: 102 mg/dL — ABNORMAL HIGH (ref 70–99)
Potassium: 3.2 mmol/L — ABNORMAL LOW (ref 3.5–5.1)
Sodium: 140 mmol/L (ref 135–145)

## 2020-07-23 LAB — SURGICAL PCR SCREEN
MRSA, PCR: NEGATIVE
Staphylococcus aureus: NEGATIVE

## 2020-07-23 NOTE — Progress Notes (Signed)
PCP - [facility] Cardiologist - Hochrein  PPM/ICD - denies Device Orders -  Rep Notified -   Chest x-ray -  EKG - 07/23/20 Stress Test -07/25/14  ECHO - 08/09/18 Cardiac Cath - 07/30/14  Sleep Study -  CPAP - BiPap  Fasting Blood Sugar - doesn't check blood sugars Checks Blood Sugar _____ times a day  Blood Thinner Instructions:n/a  Aspirin Instructions:n/a  ERAS Protcol -clear liquids until 0430 PRE-SURGERY Ensure or G2- Ensure  COVID TEST- scheduled 07/26/20   Anesthesia review: yes-cardiac history  Patient denies shortness of breath, fever, cough and chest pain at PAT appointment   All instructions explained to the patient, with a verbal understanding of the material. Patient agrees to go over the instructions while at home for a better understanding. Patient also instructed to self quarantine after being tested for COVID-19. The opportunity to ask questions was provided.

## 2020-07-24 LAB — URINE CULTURE: Culture: NO GROWTH

## 2020-07-25 NOTE — Progress Notes (Signed)
Anesthesia Chart Review: Same-day work-up  History of false positive screening treadmill test in 2016 followed by cardiac catheterization that demonstrated normal coronaries.  In July 2020 she had an echo for evaluation of murmur which showed normal LVEF, normal valves.  History of OSA on BiPAP.  Preop labs reviewed, unremarkable.  EKG 07/23/2020: NSR.  Rate 80.  Nonspecific ST and T wave abnormality.  No significant changes.  TTE 08/09/2018: 1. The left ventricle has normal systolic function with an ejection  fraction of 60-65%. The cavity size was normal. Left ventricular diastolic  Doppler parameters are consistent with impaired relaxation. No evidence of  left ventricular regional wall  motion abnormalities.   2. The right ventricle has normal systolic function. The cavity was  normal. There is no increase in right ventricular wall thickness.   3. The aorta is normal in size and structure.   Cath 07/30/2014: FINAL CONCLUSIONS: ? ANGIOGRAPHICALLY NORMAL CORONARY ARTERIES ? NORMAL LV SYSTOLIC FUNCTION   [name] [facility] Short Stay Center/Anesthesiology Phone [phone] 07/25/2020 12:51 PM

## 2020-07-25 NOTE — Anesthesia Preprocedure Evaluation (Addendum)
Anesthesia Evaluation  Patient identified by MRN, date of birth, ID band Patient awake    Reviewed: Allergy & Precautions, NPO status , Patient's Chart, lab work & pertinent test results  History of Anesthesia Complications (+) PONV  Airway Mallampati: I  TM Distance: >3 FB Neck ROM: Full    Dental no notable dental hx.    Pulmonary asthma , sleep apnea ,    Pulmonary exam normal breath sounds clear to auscultation       Cardiovascular Exercise Tolerance: Good hypertension, Pt. on medications Normal cardiovascular exam Rhythm:Regular Rate:Normal  Normal sinus rhythm Nonspecific ST and T wave abnormality Abnormal ECG nsc Confirmed by Donato Schultz ([id number]) on 07/23/2020 12:48:07 PM   Neuro/Psych negative neurological ROS  negative psych ROS   GI/Hepatic Neg liver ROS, hiatal hernia, GERD  ,  Endo/Other  Obesity. BMI 39  Renal/GU negative Renal ROS  negative genitourinary   Musculoskeletal  (+) Arthritis ,   Abdominal   Peds negative pediatric ROS (+)  Hematology  (+) anemia ,   Anesthesia Other Findings   Reproductive/Obstetrics                          Anesthesia Physical Anesthesia Plan  ASA: 3  Anesthesia Plan: Regional and Spinal   Post-op Pain Management:  Regional for Post-op pain   Induction: Intravenous  PONV Risk Score and Plan: 3 and Treatment may vary due to age or medical condition, Midazolam, Ondansetron, Dexamethasone and Propofol infusion  Airway Management Planned: Natural Airway and Simple Face Mask  Additional Equipment: None  Intra-op Plan:   Post-operative Plan:   Informed Consent: I have reviewed the patients History and Physical, chart, labs and discussed the procedure including the risks, benefits and alternatives for the proposed anesthesia with the patient or authorized representative who has indicated his/her understanding and acceptance.      Dental advisory given  Plan Discussed with: CRNA and Anesthesiologist  Anesthesia Plan Comments: (Preop adductor canal block. Spinal. Propofol gtt. GA/LMA as backup plan. Tanna Furry, MD       PAT note by Antionette Poles, PA-C: History of false positive screening treadmill test in 2016 followed by cardiac catheterization that demonstrated normal coronaries.  In July 2020 she had an echo for evaluation of murmur which showed normal LVEF, normal valves.  History of OSA on BiPAP.  Preop labs reviewed, unremarkable.  EKG 07/23/2020: NSR.  Rate 80.  Nonspecific ST and T wave abnormality.  No significant changes.  TTE 08/09/2018: 1. The left ventricle has normal systolic function with an ejection  fraction of 60-65%. The cavity size was normal. Left ventricular diastolic  Doppler parameters are consistent with impaired relaxation. No evidence of  left ventricular regional wall  motion abnormalities.  2. The right ventricle has normal systolic function. The cavity was  normal. There is no increase in right ventricular wall thickness.  3. The aorta is normal in size and structure.   Cath 07/30/2014: FINAL CONCLUSIONS:  ANGIOGRAPHICALLY NORMAL CORONARY ARTERIES  NORMAL LV SYSTOLIC FUNCTION )      Anesthesia Quick Evaluation

## 2020-07-26 ENCOUNTER — Other Ambulatory Visit (HOSPITAL_COMMUNITY)
Admission: RE | Admit: 2020-07-26 | Discharge: 2020-07-26 | Disposition: A | Discharge status: Home / Self Care | Payer: Federal, State, Local not specified - PPO | Source: Ambulatory Visit | Attending: Orthopedic Surgery | Admitting: Orthopedic Surgery

## 2020-07-26 DIAGNOSIS — Z01812 Encounter for preprocedural laboratory examination: Secondary | ICD-10-CM | POA: Insufficient documentation

## 2020-07-26 DIAGNOSIS — Z20822 Contact with and (suspected) exposure to covid-19: Secondary | ICD-10-CM | POA: Diagnosis not present

## 2020-07-26 LAB — SARS CORONAVIRUS 2 (TAT 6-24 HRS): SARS Coronavirus 2: NEGATIVE

## 2020-07-29 MED ORDER — TRANEXAMIC ACID 1000 MG/10ML IV SOLN
2000.0000 mg | INTRAVENOUS | Status: DC
  Filled 2020-07-29 (×2): qty 20

## 2020-07-30 ENCOUNTER — Other Ambulatory Visit: Payer: Self-pay

## 2020-07-30 ENCOUNTER — Observation Stay (HOSPITAL_COMMUNITY)
Admission: RE | Admit: 2020-07-30 | Discharge: 2020-07-31 | Disposition: A | Discharge status: Home / Self Care | Payer: Federal, State, Local not specified - PPO | Attending: Orthopedic Surgery | Admitting: Orthopedic Surgery

## 2020-07-30 ENCOUNTER — Encounter (HOSPITAL_COMMUNITY)
Admission: RE | Disposition: A | Discharge status: Home / Self Care | Payer: Self-pay | Source: Home / Self Care | Attending: Orthopedic Surgery

## 2020-07-30 ENCOUNTER — Ambulatory Visit (HOSPITAL_COMMUNITY): Payer: Federal, State, Local not specified - PPO | Admitting: Physician Assistant

## 2020-07-30 ENCOUNTER — Ambulatory Visit (HOSPITAL_COMMUNITY): Payer: Federal, State, Local not specified - PPO | Admitting: Anesthesiology

## 2020-07-30 ENCOUNTER — Encounter (HOSPITAL_COMMUNITY): Payer: Self-pay | Admitting: Orthopedic Surgery

## 2020-07-30 DIAGNOSIS — G8918 Other acute postprocedural pain: Secondary | ICD-10-CM | POA: Diagnosis not present

## 2020-07-30 DIAGNOSIS — R7303 Prediabetes: Secondary | ICD-10-CM | POA: Diagnosis not present

## 2020-07-30 DIAGNOSIS — M1712 Unilateral primary osteoarthritis, left knee: Secondary | ICD-10-CM | POA: Diagnosis not present

## 2020-07-30 DIAGNOSIS — Z96652 Presence of left artificial knee joint: Secondary | ICD-10-CM

## 2020-07-30 DIAGNOSIS — I10 Essential (primary) hypertension: Secondary | ICD-10-CM | POA: Insufficient documentation

## 2020-07-30 DIAGNOSIS — Z79899 Other long term (current) drug therapy: Secondary | ICD-10-CM | POA: Diagnosis not present

## 2020-07-30 DIAGNOSIS — J45909 Unspecified asthma, uncomplicated: Secondary | ICD-10-CM | POA: Insufficient documentation

## 2020-07-30 DIAGNOSIS — D649 Anemia, unspecified: Secondary | ICD-10-CM | POA: Diagnosis not present

## 2020-07-30 HISTORY — PX: TOTAL KNEE ARTHROPLASTY: SHX125

## 2020-07-30 LAB — GLUCOSE, CAPILLARY
Glucose-Capillary: 95 mg/dL (ref 70–99)
Glucose-Capillary: 79 mg/dL (ref 70–99)

## 2020-07-30 SURGERY — ARTHROPLASTY, KNEE, TOTAL
Anesthesia: Regional | Site: Knee | Laterality: Left

## 2020-07-30 MED ORDER — LIDOCAINE 2% (20 MG/ML) 5 ML SYRINGE
INTRAMUSCULAR | Status: AC
  Filled 2020-07-30: qty 5

## 2020-07-30 MED ORDER — SODIUM CHLORIDE 0.9 % IR SOLN
Status: DC | PRN
  Administered 2020-07-30: 3000 mL

## 2020-07-30 MED ORDER — BUPIVACAINE HCL (PF) 0.25 % IJ SOLN
INTRAMUSCULAR | Status: AC
  Filled 2020-07-30: qty 20

## 2020-07-30 MED ORDER — OXYCODONE HCL 5 MG PO TABS: 5.0000 mg | ORAL_TABLET | Freq: Once | ORAL | Status: DC | PRN

## 2020-07-30 MED ORDER — MIDAZOLAM HCL 2 MG/2ML IJ SOLN
INTRAMUSCULAR | Status: AC
  Filled 2020-07-30: qty 2

## 2020-07-30 MED ORDER — TRANEXAMIC ACID-NACL 1000-0.7 MG/100ML-% IV SOLN
1000.0000 mg | INTRAVENOUS | Status: AC
  Administered 2020-07-30: 1000 mg via INTRAVENOUS
  Filled 2020-07-30: qty 100

## 2020-07-30 MED ORDER — BUPIVACAINE HCL (PF) 0.25 % IJ SOLN
INTRAMUSCULAR | Status: AC
  Filled 2020-07-30: qty 10

## 2020-07-30 MED ORDER — PHENYLEPHRINE HCL-NACL 10-0.9 MG/250ML-% IV SOLN
INTRAVENOUS | Status: DC | PRN
  Administered 2020-07-30: 100 ug/min via INTRAVENOUS

## 2020-07-30 MED ORDER — CLONIDINE HCL (ANALGESIA) 100 MCG/ML EP SOLN
EPIDURAL | Status: DC | PRN
  Administered 2020-07-30: 1 mL

## 2020-07-30 MED ORDER — ALBUTEROL SULFATE (2.5 MG/3ML) 0.083% IN NEBU: 2.5000 mg | INHALATION_SOLUTION | RESPIRATORY_TRACT | Status: DC | PRN

## 2020-07-30 MED ORDER — FENTANYL CITRATE (PF) 100 MCG/2ML IJ SOLN
25.0000 ug | INTRAMUSCULAR | Status: DC | PRN
  Administered 2020-07-30: 50 ug via INTRAVENOUS

## 2020-07-30 MED ORDER — DROPERIDOL 2.5 MG/ML IJ SOLN: 0.6250 mg | Freq: Once | INTRAMUSCULAR | Status: DC | PRN

## 2020-07-30 MED ORDER — BUPIVACAINE LIPOSOME 1.3 % IJ SUSP
INTRAMUSCULAR | Status: DC | PRN
  Administered 2020-07-30: 20 mL

## 2020-07-30 MED ORDER — METOCLOPRAMIDE HCL 5 MG/ML IJ SOLN: 5.0000 mg | Freq: Three times a day (TID) | INTRAMUSCULAR | Status: DC | PRN

## 2020-07-30 MED ORDER — POVIDONE-IODINE 10 % EX SWAB: 2.0000 "application " | Freq: Once | CUTANEOUS | Status: DC

## 2020-07-30 MED ORDER — SCOPOLAMINE 1 MG/3DAYS TD PT72
MEDICATED_PATCH | TRANSDERMAL | Status: DC | PRN
  Administered 2020-07-30: 1 via TRANSDERMAL

## 2020-07-30 MED ORDER — FENTANYL CITRATE (PF) 100 MCG/2ML IJ SOLN
INTRAMUSCULAR | Status: DC | PRN
  Administered 2020-07-30 (×2): 50 ug via INTRAVENOUS

## 2020-07-30 MED ORDER — CEFAZOLIN SODIUM-DEXTROSE 2-4 GM/100ML-% IV SOLN
2.0000 g | INTRAVENOUS | Status: AC
  Administered 2020-07-30: 2 g via INTRAVENOUS
  Filled 2020-07-30: qty 100

## 2020-07-30 MED ORDER — FENTANYL CITRATE (PF) 250 MCG/5ML IJ SOLN
INTRAMUSCULAR | Status: AC
  Filled 2020-07-30: qty 5

## 2020-07-30 MED ORDER — BUPIVACAINE HCL (PF) 0.5 % IJ SOLN
INTRAMUSCULAR | Status: DC | PRN
  Administered 2020-07-30: 20 mL via PERINEURAL

## 2020-07-30 MED ORDER — ONDANSETRON HCL 4 MG PO TABS: 4.0000 mg | ORAL_TABLET | Freq: Four times a day (QID) | ORAL | Status: DC | PRN

## 2020-07-30 MED ORDER — FENTANYL CITRATE (PF) 100 MCG/2ML IJ SOLN
INTRAMUSCULAR | Status: AC
  Filled 2020-07-30: qty 2

## 2020-07-30 MED ORDER — ACETAMINOPHEN 325 MG PO TABS: 325.0000 mg | ORAL_TABLET | Freq: Four times a day (QID) | ORAL | Status: DC | PRN

## 2020-07-30 MED ORDER — 0.9 % SODIUM CHLORIDE (POUR BTL) OPTIME
TOPICAL | Status: DC | PRN
  Administered 2020-07-30 (×5): 1000 mL

## 2020-07-30 MED ORDER — SODIUM CHLORIDE 0.9% FLUSH
INTRAVENOUS | Status: DC | PRN
  Administered 2020-07-30 (×2): 10 mL

## 2020-07-30 MED ORDER — DEXAMETHASONE SODIUM PHOSPHATE 10 MG/ML IJ SOLN
INTRAMUSCULAR | Status: AC
  Filled 2020-07-30: qty 1

## 2020-07-30 MED ORDER — PANTOPRAZOLE SODIUM 40 MG PO TBEC
40.0000 mg | DELAYED_RELEASE_TABLET | Freq: Every day | ORAL | Status: DC
  Administered 2020-07-30 – 2020-07-31 (×2): 40 mg via ORAL
  Filled 2020-07-30 (×2): qty 1

## 2020-07-30 MED ORDER — CLONIDINE HCL (ANALGESIA) 100 MCG/ML EP SOLN
EPIDURAL | Status: AC
  Filled 2020-07-30: qty 10

## 2020-07-30 MED ORDER — ALBUTEROL SULFATE HFA 108 (90 BASE) MCG/ACT IN AERS: 2.0000 | INHALATION_SPRAY | RESPIRATORY_TRACT | Status: DC | PRN

## 2020-07-30 MED ORDER — DOCUSATE SODIUM 100 MG PO CAPS
100.0000 mg | ORAL_CAPSULE | Freq: Two times a day (BID) | ORAL | Status: DC
  Administered 2020-07-30 – 2020-07-31 (×2): 100 mg via ORAL
  Filled 2020-07-30 (×2): qty 1

## 2020-07-30 MED ORDER — RIVAROXABAN 10 MG PO TABS
10.0000 mg | ORAL_TABLET | Freq: Every day | ORAL | Status: DC
  Administered 2020-07-30: 10 mg via ORAL
  Filled 2020-07-30: qty 1

## 2020-07-30 MED ORDER — AMLODIPINE BESYLATE 10 MG PO TABS
10.0000 mg | ORAL_TABLET | Freq: Every day | ORAL | Status: DC
  Administered 2020-07-30 – 2020-07-31 (×2): 10 mg via ORAL
  Filled 2020-07-30 (×2): qty 1

## 2020-07-30 MED ORDER — BUPIVACAINE LIPOSOME 1.3 % IJ SUSP
INTRAMUSCULAR | Status: AC
  Filled 2020-07-30: qty 20

## 2020-07-30 MED ORDER — OXYCODONE HCL 5 MG PO TABS
ORAL_TABLET | ORAL | Status: AC
  Administered 2020-07-30: 10 mg via ORAL
  Filled 2020-07-30: qty 2

## 2020-07-30 MED ORDER — LACTATED RINGERS IV SOLN: INTRAVENOUS | Status: DC

## 2020-07-30 MED ORDER — BUPIVACAINE HCL (PF) 0.75 % IJ SOLN
INTRAMUSCULAR | Status: DC | PRN
  Administered 2020-07-30: 1.8 mL

## 2020-07-30 MED ORDER — POVIDONE-IODINE 7.5 % EX SOLN: Freq: Once | CUTANEOUS | Status: DC

## 2020-07-30 MED ORDER — ATENOLOL 50 MG PO TABS
50.0000 mg | ORAL_TABLET | Freq: Every day | ORAL | Status: DC
  Administered 2020-07-30 – 2020-07-31 (×2): 50 mg via ORAL
  Filled 2020-07-30 (×2): qty 1

## 2020-07-30 MED ORDER — TRANEXAMIC ACID-NACL 1000-0.7 MG/100ML-% IV SOLN
INTRAVENOUS | Status: AC
  Filled 2020-07-30: qty 100

## 2020-07-30 MED ORDER — MORPHINE SULFATE (PF) 4 MG/ML IV SOLN
INTRAVENOUS | Status: AC
  Filled 2020-07-30: qty 1

## 2020-07-30 MED ORDER — CEFAZOLIN SODIUM-DEXTROSE 2-4 GM/100ML-% IV SOLN
2.0000 g | Freq: Three times a day (TID) | INTRAVENOUS | Status: AC
  Administered 2020-07-30 (×2): 2 g via INTRAVENOUS
  Filled 2020-07-30 (×2): qty 100

## 2020-07-30 MED ORDER — ORAL CARE MOUTH RINSE: 15.0000 mL | Freq: Once | OROMUCOSAL | Status: AC

## 2020-07-30 MED ORDER — LORATADINE 10 MG PO TABS: 10.0000 mg | ORAL_TABLET | Freq: Every day | ORAL | Status: DC | PRN

## 2020-07-30 MED ORDER — MIDAZOLAM HCL 5 MG/5ML IJ SOLN
INTRAMUSCULAR | Status: DC | PRN
  Administered 2020-07-30: 2 mg via INTRAVENOUS

## 2020-07-30 MED ORDER — CHLORHEXIDINE GLUCONATE 0.12 % MT SOLN
15.0000 mL | Freq: Once | OROMUCOSAL | Status: AC
  Administered 2020-07-30: 15 mL via OROMUCOSAL
  Filled 2020-07-30: qty 15

## 2020-07-30 MED ORDER — PROPOFOL 10 MG/ML IV BOLUS
INTRAVENOUS | Status: DC | PRN
  Administered 2020-07-30: 50 mg via INTRAVENOUS

## 2020-07-30 MED ORDER — PHENOL 1.4 % MT LIQD: 1.0000 | OROMUCOSAL | Status: DC | PRN

## 2020-07-30 MED ORDER — VANCOMYCIN HCL 1000 MG IV SOLR
INTRAVENOUS | Status: AC
  Filled 2020-07-30: qty 1000

## 2020-07-30 MED ORDER — METHOCARBAMOL 500 MG PO TABS
ORAL_TABLET | ORAL | Status: AC
  Administered 2020-07-30: 500 mg via ORAL
  Filled 2020-07-30: qty 1

## 2020-07-30 MED ORDER — OXYCODONE HCL 5 MG PO TABS
5.0000 mg | ORAL_TABLET | ORAL | Status: DC | PRN
  Administered 2020-07-30 – 2020-07-31 (×2): 10 mg via ORAL
  Filled 2020-07-30 (×2): qty 2

## 2020-07-30 MED ORDER — MENTHOL 3 MG MT LOZG: 1.0000 | LOZENGE | OROMUCOSAL | Status: DC | PRN

## 2020-07-30 MED ORDER — METHOCARBAMOL 1000 MG/10ML IJ SOLN
500.0000 mg | Freq: Four times a day (QID) | INTRAVENOUS | Status: DC | PRN
  Filled 2020-07-30 (×2): qty 5

## 2020-07-30 MED ORDER — ACETAMINOPHEN 500 MG PO TABS
1000.0000 mg | ORAL_TABLET | Freq: Four times a day (QID) | ORAL | Status: AC
  Administered 2020-07-30 – 2020-07-31 (×4): 1000 mg via ORAL
  Filled 2020-07-30 (×4): qty 2

## 2020-07-30 MED ORDER — ONDANSETRON HCL 4 MG/2ML IJ SOLN
INTRAMUSCULAR | Status: DC | PRN
  Administered 2020-07-30: 4 mg via INTRAVENOUS

## 2020-07-30 MED ORDER — IRRISEPT - 450ML BOTTLE WITH 0.05% CHG IN STERILE WATER, USP 99.95% OPTIME
TOPICAL | Status: DC | PRN
  Administered 2020-07-30: 450 mL

## 2020-07-30 MED ORDER — PROMETHAZINE HCL 25 MG/ML IJ SOLN: 6.2500 mg | INTRAMUSCULAR | Status: DC | PRN

## 2020-07-30 MED ORDER — LIDOCAINE HCL (CARDIAC) PF 100 MG/5ML IV SOSY
PREFILLED_SYRINGE | INTRAVENOUS | Status: DC | PRN
  Administered 2020-07-30: 20 mg via INTRATRACHEAL

## 2020-07-30 MED ORDER — POVIDONE-IODINE 10 % EX SWAB
2.0000 "application " | Freq: Once | CUTANEOUS | Status: AC
  Administered 2020-07-30: 2 via TOPICAL

## 2020-07-30 MED ORDER — VANCOMYCIN HCL 1000 MG IV SOLR
INTRAVENOUS | Status: DC | PRN
  Administered 2020-07-30: 1000 mg via TOPICAL

## 2020-07-30 MED ORDER — LOSARTAN POTASSIUM 50 MG PO TABS
50.0000 mg | ORAL_TABLET | Freq: Every day | ORAL | Status: DC
  Administered 2020-07-30 – 2020-07-31 (×2): 50 mg via ORAL
  Filled 2020-07-30 (×2): qty 1

## 2020-07-30 MED ORDER — OXYCODONE HCL 5 MG/5ML PO SOLN: 5.0000 mg | Freq: Once | ORAL | Status: DC | PRN

## 2020-07-30 MED ORDER — AMITRIPTYLINE HCL 50 MG PO TABS
75.0000 mg | ORAL_TABLET | Freq: Every day | ORAL | Status: DC
  Administered 2020-07-30: 75 mg via ORAL
  Filled 2020-07-30: qty 2
  Filled 2020-07-30: qty 1

## 2020-07-30 MED ORDER — FENTANYL CITRATE (PF) 100 MCG/2ML IJ SOLN
INTRAMUSCULAR | Status: AC
  Administered 2020-07-30: 50 ug via INTRAVENOUS
  Filled 2020-07-30: qty 2

## 2020-07-30 MED ORDER — METHOCARBAMOL 500 MG PO TABS
500.0000 mg | ORAL_TABLET | Freq: Four times a day (QID) | ORAL | Status: DC | PRN
  Administered 2020-07-30 – 2020-07-31 (×2): 500 mg via ORAL
  Filled 2020-07-30 (×2): qty 1

## 2020-07-30 MED ORDER — HYDROMORPHONE HCL 1 MG/ML IJ SOLN
0.5000 mg | INTRAMUSCULAR | Status: DC | PRN
  Administered 2020-07-30: 0.5 mg via INTRAVENOUS
  Filled 2020-07-30: qty 0.5

## 2020-07-30 MED ORDER — TRANEXAMIC ACID 1000 MG/10ML IV SOLN
INTRAVENOUS | Status: DC | PRN
  Administered 2020-07-30: 2000 mg via TOPICAL

## 2020-07-30 MED ORDER — ONDANSETRON HCL 4 MG/2ML IJ SOLN
INTRAMUSCULAR | Status: AC
  Filled 2020-07-30: qty 2

## 2020-07-30 MED ORDER — BUPIVACAINE HCL 0.25 % IJ SOLN
INTRAMUSCULAR | Status: DC | PRN
  Administered 2020-07-30 (×3): 10 mL

## 2020-07-30 MED ORDER — GABAPENTIN 400 MG PO CAPS: 400.0000 mg | ORAL_CAPSULE | Freq: Every evening | ORAL | Status: DC | PRN

## 2020-07-30 MED ORDER — PROPOFOL 500 MG/50ML IV EMUL
INTRAVENOUS | Status: DC | PRN
  Administered 2020-07-30: 50 ug/kg/min via INTRAVENOUS

## 2020-07-30 MED ORDER — ONDANSETRON HCL 4 MG/2ML IJ SOLN
4.0000 mg | Freq: Four times a day (QID) | INTRAMUSCULAR | Status: DC | PRN
  Administered 2020-07-30: 4 mg via INTRAVENOUS
  Filled 2020-07-30: qty 2

## 2020-07-30 MED ORDER — METOCLOPRAMIDE HCL 5 MG PO TABS: 5.0000 mg | ORAL_TABLET | Freq: Three times a day (TID) | ORAL | Status: DC | PRN

## 2020-07-30 MED ORDER — ALBUMIN HUMAN 5 % IV SOLN: INTRAVENOUS | Status: DC | PRN

## 2020-07-30 MED ORDER — SCOPOLAMINE 1 MG/3DAYS TD PT72
MEDICATED_PATCH | TRANSDERMAL | Status: AC
  Filled 2020-07-30: qty 1

## 2020-07-30 MED ORDER — MORPHINE SULFATE (PF) 4 MG/ML IV SOLN
INTRAVENOUS | Status: DC | PRN
  Administered 2020-07-30 (×2): 4 mg

## 2020-07-30 MED ORDER — PROPOFOL 10 MG/ML IV BOLUS
INTRAVENOUS | Status: AC
  Filled 2020-07-30: qty 20

## 2020-07-30 MED ORDER — DEXAMETHASONE SODIUM PHOSPHATE 10 MG/ML IJ SOLN
INTRAMUSCULAR | Status: DC | PRN
  Administered 2020-07-30: 10 mg via INTRAVENOUS

## 2020-07-30 SURGICAL SUPPLY — 83 items
BAG COUNTER SPONGE SURGICOUNT (BAG) ×2 IMPLANT
BAG DECANTER FOR FLEXI CONT (MISCELLANEOUS) ×2 IMPLANT
BAG SPNG CNTER NS LX DISP (BAG) ×1
BANDAGE ESMARK 6X9 LF (GAUZE/BANDAGES/DRESSINGS) ×1 IMPLANT
BASEPLATE TIBIAL SZ2 TRI (Joint) IMPLANT
BLADE SAG 18X100X1.27 (BLADE) ×2 IMPLANT
BNDG CMPR 9X6 STRL LF SNTH (GAUZE/BANDAGES/DRESSINGS) ×1
BNDG CMPR MED 10X6 ELC LF (GAUZE/BANDAGES/DRESSINGS) ×1
BNDG CMPR MED 15X6 ELC VLCR LF (GAUZE/BANDAGES/DRESSINGS) ×1
BNDG COHESIVE 6X5 TAN STRL LF (GAUZE/BANDAGES/DRESSINGS) ×2 IMPLANT
BNDG ELASTIC 6X10 VLCR STRL LF (GAUZE/BANDAGES/DRESSINGS) ×1 IMPLANT
BNDG ELASTIC 6X15 VLCR STRL LF (GAUZE/BANDAGES/DRESSINGS) ×2 IMPLANT
BNDG ESMARK 6X9 LF (GAUZE/BANDAGES/DRESSINGS) ×2
BOWL SMART MIX CTS (DISPOSABLE) IMPLANT
BSPLAT TIB 2 KN TRITANIUM (Joint) ×1 IMPLANT
CLSR STERI-STRIP ANTIMIC 1/2X4 (GAUZE/BANDAGES/DRESSINGS) ×2 IMPLANT
CNTNR URN SCR LID CUP LEK RST (MISCELLANEOUS) ×1 IMPLANT
COMP FEMORAL TRIATHLON SZ3 (Joint) ×2 IMPLANT
COMPONENT FEMRL TRIATHLON SZ3 (Joint) IMPLANT
CONT SPEC 4OZ STRL OR WHT (MISCELLANEOUS) ×2
COVER SURGICAL LIGHT HANDLE (MISCELLANEOUS) ×2 IMPLANT
CUFF TOURN SGL QUICK 34 (TOURNIQUET CUFF) ×2
CUFF TOURN SGL QUICK 42 (TOURNIQUET CUFF) IMPLANT
CUFF TRNQT CYL 34X4.125X (TOURNIQUET CUFF) ×1 IMPLANT
DECANTER SPIKE VIAL GLASS SM (MISCELLANEOUS) ×2 IMPLANT
DRAPE INCISE IOBAN 66X45 STRL (DRAPES) ×2 IMPLANT
DRAPE ORTHO SPLIT 77X108 STRL (DRAPES) ×6
DRAPE SURG ORHT 6 SPLT 77X108 (DRAPES) ×3 IMPLANT
DRAPE U-SHAPE 47X51 STRL (DRAPES) ×2 IMPLANT
DRSG AQUACEL AG ADV 3.5X14 (GAUZE/BANDAGES/DRESSINGS) ×2 IMPLANT
DURAPREP 26ML APPLICATOR (WOUND CARE) ×3 IMPLANT
ELECT CAUTERY BLADE 6.4 (BLADE) ×2 IMPLANT
ELECT REM PT RETURN 9FT ADLT (ELECTROSURGICAL) ×2
ELECTRODE REM PT RTRN 9FT ADLT (ELECTROSURGICAL) ×1 IMPLANT
GAUZE SPONGE 4X4 12PLY STRL (GAUZE/BANDAGES/DRESSINGS) ×2 IMPLANT
GAUZE SPONGE 4X4 12PLY STRL LF (GAUZE/BANDAGES/DRESSINGS) ×1 IMPLANT
GLOVE SRG 8 PF TXTR STRL LF DI (GLOVE) ×1 IMPLANT
GLOVE SURG LTX SZ7 (GLOVE) ×2 IMPLANT
GLOVE SURG LTX SZ8 (GLOVE) ×2 IMPLANT
GLOVE SURG UNDER POLY LF SZ7 (GLOVE) ×2 IMPLANT
GLOVE SURG UNDER POLY LF SZ8 (GLOVE) ×2
GOWN STRL REUS W/ TWL LRG LVL3 (GOWN DISPOSABLE) ×3 IMPLANT
GOWN STRL REUS W/TWL LRG LVL3 (GOWN DISPOSABLE) ×6
HANDPIECE INTERPULSE COAX TIP (DISPOSABLE) ×2
HOOD PEEL AWAY FLYTE STAYCOOL (MISCELLANEOUS) ×6 IMPLANT
IMMOBILIZER KNEE 20 (SOFTGOODS) ×2
IMMOBILIZER KNEE 20 THIGH 36 (SOFTGOODS) ×1 IMPLANT
IMMOBILIZER KNEE 22 UNIV (SOFTGOODS) IMPLANT
IMMOBILIZER KNEE 24 THIGH 36 (MISCELLANEOUS) IMPLANT
IMMOBILIZER KNEE 24 UNIV (MISCELLANEOUS)
INSERT ART TIB TRTH CS SZ2 13 (Insert) ×1 IMPLANT
KIT BASIN OR (CUSTOM PROCEDURE TRAY) ×2 IMPLANT
KIT TURNOVER KIT B (KITS) ×2 IMPLANT
KNEE PATELLA ASYMMETRIC 9X29 (Knees) ×1 IMPLANT
MANIFOLD NEPTUNE II (INSTRUMENTS) ×2 IMPLANT
NDL SPNL 18GX3.5 QUINCKE PK (NEEDLE) ×1 IMPLANT
NEEDLE 22X1 1/2 (OR ONLY) (NEEDLE) ×4 IMPLANT
NEEDLE SPNL 18GX3.5 QUINCKE PK (NEEDLE) ×2 IMPLANT
NS IRRIG 1000ML POUR BTL (IV SOLUTION) ×7 IMPLANT
PACK TOTAL JOINT (CUSTOM PROCEDURE TRAY) ×2 IMPLANT
PAD ARMBOARD 7.5X6 YLW CONV (MISCELLANEOUS) ×4 IMPLANT
PAD CAST 4YDX4 CTTN HI CHSV (CAST SUPPLIES) ×1 IMPLANT
PADDING CAST COTTON 4X4 STRL (CAST SUPPLIES)
PADDING CAST COTTON 6X4 STRL (CAST SUPPLIES) ×2 IMPLANT
PIN FLUTED HEDLESS FIX 3.5X1/8 (PIN) ×1 IMPLANT
SET HNDPC FAN SPRY TIP SCT (DISPOSABLE) ×1 IMPLANT
STRIP CLOSURE SKIN 1/2X4 (GAUZE/BANDAGES/DRESSINGS) ×4 IMPLANT
SUCTION FRAZIER HANDLE 10FR (MISCELLANEOUS) ×2
SUCTION TUBE FRAZIER 10FR DISP (MISCELLANEOUS) ×1 IMPLANT
SUT MNCRL AB 3-0 PS2 18 (SUTURE) ×2 IMPLANT
SUT VIC AB 0 CT1 27 (SUTURE) ×6
SUT VIC AB 0 CT1 27XBRD ANBCTR (SUTURE) ×3 IMPLANT
SUT VIC AB 1 CT1 27 (SUTURE) ×10
SUT VIC AB 1 CT1 27XBRD ANBCTR (SUTURE) ×5 IMPLANT
SUT VIC AB 2-0 CT1 27 (SUTURE) ×8
SUT VIC AB 2-0 CT1 TAPERPNT 27 (SUTURE) ×4 IMPLANT
SYR 30ML LL (SYRINGE) ×6 IMPLANT
SYR TB 1ML LUER SLIP (SYRINGE) ×2 IMPLANT
TIBIAL BASEPLATE SZ2 TRI (Joint) ×2 IMPLANT
TOWEL GREEN STERILE (TOWEL DISPOSABLE) ×4 IMPLANT
TOWEL GREEN STERILE FF (TOWEL DISPOSABLE) ×4 IMPLANT
TRAY CATH 16FR W/PLASTIC CATH (SET/KITS/TRAYS/PACK) ×1 IMPLANT
WATER STERILE IRR 1000ML POUR (IV SOLUTION) IMPLANT

## 2020-07-30 NOTE — Evaluation (Signed)
Physical Therapy Evaluation Patient Details Name: Bethany Kemp MRN: [id number] DOB: 1960/09/05 Today's Date: 07/30/2020   History of Present Illness  Pt is a 60 y/o female s/p L TKA on 7/19. PMH includes asthma, DVT, and HTN.  Clinical Impression  Pt is s/p surgery above with deficits below. Pt requiring min A for bed mobility and transfers and min guard A for short distance gait to chair using RW. Pt limited secondary to nausea and hot flashes. Reviewed knee precautions with pt. Reports daughter will be staying with her at d/c. Will continue to follow acutely.     Follow Up Recommendations Follow surgeon's recommendation for DC plan and follow-up therapies    Equipment Recommendations  None recommended by PT    Recommendations for Other Services       Precautions / Restrictions Precautions Precautions: Knee Precaution Booklet Issued: No Precaution Comments: Reviewed knee precautions with pt Restrictions Weight Bearing Restrictions: Yes LLE Weight Bearing: Weight bearing as tolerated      Mobility  Bed Mobility Overal bed mobility: Needs Assistance Bed Mobility: Supine to Sit     Supine to sit: Min assist     General bed mobility comments: Min A for LLE assist.; pt described "having a hot flash" and felt some nausea during supine to sit    Transfers Overall transfer level: Needs assistance Equipment used: Rolling walker (2 wheeled) Transfers: Sit to/from Stand Sit to Stand: Min assist;+2 safety/equipment         General transfer comment: Pt completed sit to stand transfer with min assist for lift assist and steadying with use of RW. Cues for hand placement.  Ambulation/Gait Ambulation/Gait assistance: Min guard Gait Distance (Feet): 3 Feet Assistive device: Rolling walker (2 wheeled) Gait Pattern/deviations: Step-to pattern;Decreased weight shift to left;Antalgic;Decreased stride length Gait velocity: Decreased   General Gait Details: Slow, guarded gait.  Cues for sequencing using RW. Pt limited to chair secondary to symptoms of nausea and hot flash.  Stairs            Wheelchair Mobility    Modified Rankin (Stroke Patients Only)       Balance Overall balance assessment: Needs assistance Sitting-balance support: No upper extremity supported;Feet supported Sitting balance-Leahy Scale: Good     Standing balance support: Bilateral upper extremity supported Standing balance-Leahy Scale: Poor Standing balance comment: Reliant on BUE support                             Pertinent Vitals/Pain Pain Assessment: Faces Faces Pain Scale: Hurts a little bit Pain Location: anterior left knee Pain Descriptors / Indicators: Burning Pain Intervention(s): Limited activity within patient's tolerance;Monitored during session;Repositioned    Home Living Family/patient expects to be discharged to:: Private residence Living Arrangements: Alone Available Help at Discharge: Family (Daughter) Type of Home: House Home Access: Stairs to enter Entrance Stairs-Rails: None Secretary/administrator of Steps: 1 Home Layout: One level Home Equipment: Environmental consultant - 2 wheels      Prior Function Level of Independence: Independent               Hand Dominance        Extremity/Trunk Assessment   Upper Extremity Assessment Upper Extremity Assessment: Overall WFL for tasks assessed    Lower Extremity Assessment Lower Extremity Assessment: LLE deficits/detail LLE Deficits / Details: Deficits consistent with post op pain and weakness.    Cervical / Trunk Assessment Cervical / Trunk Assessment: Normal  Communication  Communication: No difficulties  Cognition Arousal/Alertness: Awake/alert Behavior During Therapy: WFL for tasks assessed/performed Overall Cognitive Status: Within Functional Limits for tasks assessed                                        General Comments General comments (skin integrity, edema,  etc.): Daughter present    Exercises Total Joint Exercises Ankle Circles/Pumps: AROM;Left;20 reps Heel Slides:  (Verbally reviewed heel slides with pt)   Assessment/Plan    PT Assessment Patient needs continued PT services  PT Problem List Decreased range of motion;Decreased activity tolerance;Decreased mobility;Pain;Decreased strength;Decreased balance;Decreased knowledge of use of DME;Decreased knowledge of precautions       PT Treatment Interventions DME instruction;Gait training;Stair training;Functional mobility training;Therapeutic exercise;Therapeutic activities;Balance training;Patient/family education    PT Goals (Current goals can be found in the Care Plan section)  Acute Rehab PT Goals Patient Stated Goal: to be independent PT Goal Formulation: With patient Time For Goal Achievement: 08/13/20 Potential to Achieve Goals: Good    Frequency 7X/week   Barriers to discharge        Co-evaluation               AM-PAC PT "6 Clicks" Mobility  Outcome Measure Help needed turning from your back to your side while in a flat bed without using bedrails?: A Little Help needed moving from lying on your back to sitting on the side of a flat bed without using bedrails?: A Little Help needed moving to and from a bed to a chair (including a wheelchair)?: A Little Help needed standing up from a chair using your arms (e.g., wheelchair or bedside chair)?: A Little Help needed to walk in hospital room?: A Little Help needed climbing 3-5 steps with a railing? : A Lot 6 Click Score: 17    End of Session Equipment Utilized During Treatment: Gait belt Activity Tolerance: Treatment limited secondary to medical complications (Comment) (nausea/hot flash) Patient left: in chair;with call bell/phone within reach;with family/visitor present Nurse Communication: Mobility status;Other (comment) (Pt feeling nauseous) PT Visit Diagnosis: Other abnormalities of gait and mobility  (R26.89);Pain Pain - Right/Left: Left Pain - part of body: Knee    Time: 6256-3893 PT Time Calculation (min) (ACUTE ONLY): 21 min   Charges:   PT Evaluation $PT Eval Low Complexity: 1 Low          Cindee Salt, DPT  Acute Rehabilitation Services  Pager: [phone] Office: [phone]   Lehman Prom 07/30/2020, 3:56 PM

## 2020-07-30 NOTE — Anesthesia Postprocedure Evaluation (Signed)
Anesthesia Post Note  Patient: Bethany Kemp  Procedure(s) Performed: LEFT TOTAL KNEE ARTHROPLASTY (Left: Knee)     Patient location during evaluation: PACU Anesthesia Type: Regional, MAC and Spinal Level of consciousness: awake and alert Pain management: pain level controlled Vital Signs Assessment: post-procedure vital signs reviewed and stable Respiratory status: spontaneous breathing and respiratory function stable Cardiovascular status: stable Postop Assessment: no apparent nausea or vomiting Anesthetic complications: no   No notable events documented.  Last Vitals:  Vitals:   07/30/20 1145 07/30/20 1223  BP: 140/78 (!) 143/79  Pulse: 84 85  Resp: 16 16  Temp: 36.6 C 36.8 C  SpO2: 96% 96%    Last Pain:  Vitals:   07/30/20 1223  TempSrc: Oral  PainSc:                  Merlinda Frederick

## 2020-07-30 NOTE — Progress Notes (Signed)
Orthopedic Tech Progress Note Patient Details:  Bethany Kemp [birth year] [id number]  Ortho Devices Type of Ortho Device: Bone foam zero knee Ortho Device/Splint Location: Left Knee Ortho Device/Splint Interventions: Ordered, Application   Post Interventions Patient Tolerated: Well  [name] 07/30/2020, 12:17 PM

## 2020-07-30 NOTE — Anesthesia Procedure Notes (Signed)
Spinal  Patient location during procedure: OR Start time: 07/30/2020 7:30 AM End time: 07/30/2020 7:35 AM Reason for block: surgical anesthesia Staffing Performed: anesthesiologist  Anesthesiologist: Mellody Dance, MD Preanesthetic Checklist Completed: patient identified, IV checked, risks and benefits discussed, surgical consent, monitors and equipment checked, pre-op evaluation and timeout performed Spinal Block Patient position: sitting Prep: DuraPrep Patient monitoring: cardiac monitor, continuous pulse ox and blood pressure Approach: midline Location: L3-4 Injection technique: single-shot Needle Needle type: Pencan  Needle gauge: 24 G Needle length: 9 cm Assessment Events: CSF return Additional Notes Functioning IV was confirmed and monitors were applied. Sterile prep and drape, including hand hygiene and sterile gloves were used. The patient was positioned and the spine was prepped. The skin was anesthetized with lidocaine.  Free flow of clear CSF was obtained prior to injecting local anesthetic into the CSF.  The spinal needle aspirated freely following injection.  The needle was carefully withdrawn.  The patient tolerated the procedure well.

## 2020-07-30 NOTE — Brief Op Note (Signed)
   07/30/2020  10:39 AM  PATIENT:  Bethany Kemp  60 y.o. female  PRE-OPERATIVE DIAGNOSIS:  left knee osteoarthritis  POST-OPERATIVE DIAGNOSIS:  left knee osteoarthritis  PROCEDURE:  Procedure(s): LEFT TOTAL KNEE ARTHROPLASTY  SURGEON:  Surgeon(s): [name], MD  ASSISTANT: magnant pa  ANESTHESIA:   spinal  EBL: 50 ml    Total I/O In: 1700 [I.V.:1000; IV Piggyback:700] Out: 950 [Urine:900; Blood:50]  BLOOD ADMINISTERED: none  DRAINS: none   LOCAL MEDICATIONS USED:  vanco,marcaine mso4 clonidine  SPECIMEN:  No Specimen  COUNTS:  YES  TOURNIQUET:   Total Tourniquet Time Documented: Thigh (Left) - 84 minutes Total: Thigh (Left) - 84 minutes   DICTATION: .Other Dictation: Dictation Number [id number]  PLAN OF CARE: Admit for overnight observation  PATIENT DISPOSITION:  PACU - hemodynamically stable

## 2020-07-30 NOTE — H&P (Signed)
TOTAL KNEE ADMISSION H&P  Patient is being admitted for left total knee arthroplasty.  Subjective:  Chief Complaint:left knee pain.  HPI: Bethany Kemp, 60 y.o. female, has a history of pain and functional disability in the left knee due to arthritis and has failed non-surgical conservative treatments for greater than 12 weeks to includeNSAID's and/or analgesics, corticosteriod injections, viscosupplementation injections, flexibility and strengthening excercises, and activity modification.  Onset of symptoms was gradual, starting >10 years ago with gradually worsening course since that time. The patient noted prior procedures on the knee to include  arthroscopy and meniscectomy on the left knee(s).  Patient currently rates pain in the left knee(s) at 9 out of 10 with activity. Patient has night pain, worsening of pain with activity and weight bearing, pain that interferes with activities of daily living, pain with passive range of motion, and crepitus.  Patient has evidence of subchondral sclerosis and joint space narrowing by imaging studies. This patient has had  a history of deep vein thrombosis on the right-hand side years ago.  No history of DVT on the left.  Plan to use Xarelto on postop day #1 today. . There is no active infection.  Patient Active Problem List   Diagnosis Date Noted   Unilateral primary osteoarthritis, left knee 05/07/2016   Abnormal stress ECG with treadmill 07/30/2014   Palpitation 06/22/2014   Dyspnea 06/22/2014   Past Medical History:  Diagnosis Date   Anemia    Arthritis    Asthma    Complication of anesthesia    Diverticulitis    DVT (deep venous thrombosis) (HCC)    after knee surgery   Family history of adverse reaction to anesthesia    sister had a hard time waking up   Gastritis    1980's was hospitalized   GERD (gastroesophageal reflux disease)    Heart murmur    History of hiatal hernia    Hypertension    Obesities, morbid (HCC)    PONV  (postoperative nausea and vomiting)    Pre-diabetes    Sleep apnea    BiPap    Past Surgical History:  Procedure Laterality Date   BREAST REDUCTION SURGERY     BREAST SURGERY     BUNIONECTOMY  01/13/1991   bilateral   CARDIAC CATHETERIZATION N/A 07/30/2014   Procedure: Left Heart Cath and Coronary Angiography;  Surgeon: Tonny Bollman, MD;  Location: [facility] INVASIVE CV LAB;  Service: Cardiovascular;  Laterality: N/A;   CHOLECYSTECTOMY  01/13/2003   HERNIA REPAIR  01/13/1980   KNEE SURGERY  01/12/2001   Righ knee -- chondal implant    Current Facility-Administered Medications  Medication Dose Route Frequency Provider Last Rate Last Admin   ceFAZolin (ANCEF) IVPB 2g/100 mL premix  2 g Intravenous On Call to OR Magnant, Joycie Peek, PA-C       chlorhexidine (PERIDEX) 0.12 % solution 15 mL  15 mL Mouth/Throat Once Gaynelle Adu, MD       Or   MEDLINE mouth rinse  15 mL Mouth Rinse Once Gaynelle Adu, MD       lactated ringers infusion   Intravenous Continuous Gaynelle Adu, MD       povidone-iodine (BETADINE) 7.5 % scrub   Topical Once Magnant, Charles L, PA-C       povidone-iodine 10 % swab 2 application  2 application Topical Once Magnant, Charles L, PA-C       povidone-iodine 10 % swab 2 application  2 application Topical Once Magnant, Joycie Peek,  PA-C       tranexamic acid (CYKLOKAPRON) 2,000 mg in sodium chloride 0.9 % 50 mL Topical Application  2,000 mg Topical To OR August Saucer, Corrie Mckusick, MD       tranexamic acid (CYKLOKAPRON) IVPB 1,000 mg  1,000 mg Intravenous To OR Magnant, Charles L, PA-C       Allergies  Allergen Reactions   Ibuprofen     GI upset   Lisinopril Cough   Acetaminophen Other (See Comments)    GI upset   Aspirin Other (See Comments)    High dose aspirin irritates stomach   Spironolactone Rash    Social History   Tobacco Use   Smoking status: Never   Smokeless tobacco: Never  Substance Use Topics   Alcohol use: No    Family History   Problem Relation Age of Onset   Asthma Father    Heart attack Mother 57       Died with MI age 22   Arthritis Mother    Diabetes Mother    Stroke Sister    Breast cancer Sister    Asthma Daughter      Review of Systems  Musculoskeletal:  Positive for arthralgias.  All other systems reviewed and are negative.  Objective:  Physical Exam Vitals reviewed.  HENT:     Head: Normocephalic.     Nose: Nose normal.     Mouth/Throat:     Mouth: Mucous membranes are moist.  Cardiovascular:     Rate and Rhythm: Normal rate.     Pulses: Normal pulses.  Pulmonary:     Effort: Pulmonary effort is normal.  Abdominal:     General: Abdomen is flat.  Musculoskeletal:     Cervical back: Normal range of motion.  Skin:    General: Skin is warm.     Capillary Refill: Capillary refill takes less than 2 seconds.  Neurological:     General: No focal deficit present.     Mental Status: She is alert.  Psychiatric:        Mood and Affect: Mood normal.   Left knee has range of motion of 5-1 05.  Collateral and cruciate ligaments are stable.  Dorsalis pedal pulse intact.  No calf tenderness negative Homans no calf swelling on the left-hand side today. Vital signs in last 24 hours: Temp:  [97.9 F (36.6 C)] 97.9 F (36.6 C) (07/19 0615) Pulse Rate:  [82] 82 (07/19 0615) Resp:  [17] 17 (07/19 0615) BP: (172)/(91) 172/91 (07/19 0615) SpO2:  [100 %] 100 % (07/19 0615) Weight:  [102.2 kg] 102.2 kg (07/19 0615)  Labs:   Estimated body mass index is 38.67 kg/m as calculated from the following:   Height as of this encounter: 5\' 4"  (1.626 m).   Weight as of this encounter: 102.2 kg.   Imaging Review Plain radiographs demonstrate severe degenerative joint disease of the left knee(s). The overall alignment ismild varus. The bone quality appears to be good for age and reported activity level.      Assessment/Plan:  End stage arthritis, left knee   The patient history, physical  examination, clinical judgment of the provider and imaging studies are consistent with end stage degenerative joint disease of the left knee(s) and total knee arthroplasty is deemed medically necessary. The treatment options including medical management, injection therapy arthroscopy and arthroplasty were discussed at length. The risks and benefits of total knee arthroplasty were presented and reviewed. The risks due to aseptic loosening, infection, stiffness, patella tracking  problems, thromboembolic complications and other imponderables were discussed. The patient acknowledged the explanation, agreed to proceed with the plan and consent was signed. Patient is being admitted for inpatient treatment for surgery, pain control, PT, OT, prophylactic antibiotics, VTE prophylaxis, progressive ambulation and ADL's and discharge planning. The patient is planning to be discharged home with home health services     Patient's anticipated LOS is less than 2 midnights, meeting these requirements: - Younger than 40 - Lives within 1 hour of care - Has a competent adult at home to recover with post-op recover - NO history of  - Chronic pain requiring opiods  - Diabetes  - Coronary Artery Disease  - Heart failure  - Heart attack  - Stroke  - DVT/VTE  - Cardiac arrhythmia  - Respiratory Failure/COPD  - Renal failure  - Anemia  - Advanced Liver disease

## 2020-07-30 NOTE — Anesthesia Procedure Notes (Signed)
Procedure Name: MAC Date/Time: 07/30/2020 7:30 AM Performed by: Michele Rockers, CRNA Pre-anesthesia Checklist: Patient identified, Emergency Drugs available, Suction available, Timeout performed and Patient being monitored Patient Re-evaluated:Patient Re-evaluated prior to induction Oxygen Delivery Method: Simple face mask

## 2020-07-30 NOTE — Anesthesia Procedure Notes (Signed)
Anesthesia Regional Block: Adductor canal block   Pre-Anesthetic Checklist: , timeout performed,  Correct Patient, Correct Site, Correct Laterality,  Correct Procedure, Correct Position, site marked,  Risks and benefits discussed,  Surgical consent,  Pre-op evaluation,  At surgeon's request and post-op pain management  Laterality: Left  Prep: chloraprep       Needles:  Injection technique: Single-shot  Needle Type: Echogenic Stimulator Needle     Needle Length: 10cm  Needle Gauge: 20     Additional Needles:   Narrative:  Start time: 07/30/2020 7:05 AM End time: 07/30/2020 7:10 AM Injection made incrementally with aspirations every 5 mL.  Performed by: Personally  Anesthesiologist: Mellody Dance, MD  Additional Notes: A functioning IV was confirmed and monitors were applied.  Sterile prep and drape, hand hygiene and sterile gloves were used.  Negative aspiration and test dose prior to incremental administration of local anesthetic. The patient tolerated the procedure well.Ultrasound  guidance: relevant anatomy identified, needle position confirmed, local anesthetic spread visualized around nerve(s), vascular puncture avoided.  Image printed for medical record.

## 2020-07-30 NOTE — Transfer of Care (Signed)
Immediate Anesthesia Transfer of Care Note  Patient: Bethany Kemp  Procedure(s) Performed: LEFT TOTAL KNEE ARTHROPLASTY (Left: Knee)  Patient Location: PACU  Anesthesia Type:Spinal  Level of Consciousness: awake, alert , patient cooperative and responds to stimulation  Airway & Oxygen Therapy: Patient Spontanous Breathing  Post-op Assessment: Report given to RN, Post -op Vital signs reviewed and stable and Patient moving all extremities X 4  Post vital signs: Reviewed and stable  Last Vitals:  Vitals Value Taken Time  BP 148/83 07/30/20 1029  Temp    Pulse 81 07/30/20 1038  Resp 19 07/30/20 1038  SpO2 98 % 07/30/20 1038  Vitals shown include unvalidated device data.  Last Pain:  Vitals:   07/30/20 0621  TempSrc:   PainSc: 7       Patients Stated Pain Goal: 3 (07/30/20 7262)  Complications: No notable events documented.

## 2020-07-30 NOTE — Plan of Care (Signed)
  Problem: Health Behavior/Discharge Planning: Goal: Ability to manage health-related needs will improve Outcome: Progressing   Problem: Activity: Goal: Risk for activity intolerance will decrease Outcome: Progressing   Problem: Pain Managment: Goal: General experience of comfort will improve Outcome: Progressing   Problem: Safety: Goal: Ability to remain free from injury will improve Outcome: Progressing   Problem: Skin Integrity: Goal: Risk for impaired skin integrity will decrease Outcome: Progressing   

## 2020-07-30 NOTE — TOC Progression Note (Signed)
Transition of Care [facility]) - Progression Note    Patient Details  Name: Bethany Kemp MRN: [id number] Date of Birth: [birth year]  Transition of Care [facility]) CM/SW Contact  Ralene Bathe, LCSWA Phone Number: 07/30/2020, 12:42 PM  Clinical Narrative:    TOC following patient for any d/c planning needs once medically stable.  Cleon Gustin, MSW, LCSWA        Expected Discharge Plan and Services                                                 Social Determinants of Health (SDOH) Interventions    Readmission Risk Interventions No flowsheet data found.

## 2020-07-30 NOTE — Plan of Care (Signed)
  Problem: Health Behavior/Discharge Planning: Goal: Ability to manage health-related needs will improve Outcome: Progressing   Problem: Clinical Measurements: Goal: Ability to maintain clinical measurements within normal limits will improve Outcome: Progressing Goal: Will remain free from infection Outcome: Progressing   

## 2020-07-31 ENCOUNTER — Telehealth: Payer: Self-pay

## 2020-07-31 ENCOUNTER — Encounter (HOSPITAL_COMMUNITY): Payer: Self-pay | Admitting: Orthopedic Surgery

## 2020-07-31 DIAGNOSIS — R7303 Prediabetes: Secondary | ICD-10-CM | POA: Diagnosis not present

## 2020-07-31 DIAGNOSIS — Z96652 Presence of left artificial knee joint: Secondary | ICD-10-CM | POA: Diagnosis not present

## 2020-07-31 DIAGNOSIS — M1712 Unilateral primary osteoarthritis, left knee: Secondary | ICD-10-CM | POA: Diagnosis not present

## 2020-07-31 DIAGNOSIS — J45909 Unspecified asthma, uncomplicated: Secondary | ICD-10-CM | POA: Diagnosis not present

## 2020-07-31 DIAGNOSIS — I10 Essential (primary) hypertension: Secondary | ICD-10-CM | POA: Diagnosis not present

## 2020-07-31 DIAGNOSIS — Z79899 Other long term (current) drug therapy: Secondary | ICD-10-CM | POA: Diagnosis not present

## 2020-07-31 LAB — GLUCOSE, CAPILLARY
Glucose-Capillary: 97 mg/dL (ref 70–99)
Glucose-Capillary: 97 mg/dL (ref 70–99)

## 2020-07-31 MED ORDER — OXYCODONE HCL 5 MG PO TABS: 5.0000 mg | ORAL_TABLET | ORAL | 0 refills | Status: DC | PRN

## 2020-07-31 MED ORDER — METHOCARBAMOL 500 MG PO TABS: 500.0000 mg | ORAL_TABLET | Freq: Three times a day (TID) | ORAL | 0 refills | Status: AC | PRN

## 2020-07-31 MED ORDER — RIVAROXABAN 10 MG PO TABS: 10.0000 mg | ORAL_TABLET | Freq: Every day | ORAL | 0 refills | Status: DC

## 2020-07-31 NOTE — Progress Notes (Signed)
  Subjective: Bethany Kemp is a 60 y.o. female s/p left TKA.  They are POD 1.  Pt's pain is controlled.  Pt denies numbness/tingling/weakness.  Pt has ambulated with some difficulty.  She was able to ambulate down the hall in her morning physical therapy session and did a lap around half of the unit.  She denies any significant dizziness or lightheadedness.  Denies any chest pain, shortness of breath, calf pain.  She does have history of DVT with previous knee surgery.  Currently on Xarelto.  Patient has been using CPM machine.  She has a CPM machine at home as well.  Objective: Vital signs in last 24 hours: Temp:  [98.1 F (36.7 C)-98.6 F (37 C)] 98.6 F (37 C) (07/20 0700) Pulse Rate:  [72-84] 84 (07/20 0700) Resp:  [16-18] 16 (07/20 0700) BP: (119-162)/(73-87) 162/86 (07/20 0700) SpO2:  [99 %-100 %] 100 % (07/20 0700)  Intake/Output from previous day: 07/19 0701 - 07/20 0700 In: 2165.2 [P.O.:240; I.V.:1225.2; IV Piggyback:700] Out: 1225 [Urine:1175; Blood:50] Intake/Output this shift: Total I/O In: 1 [P.O.:1] Out: -   Exam:  No gross blood or drainage overlying the dressing 2+ DP pulse Sensation intact distally in the left foot Able to dorsiflex and plantarflex the left foot Able to perform straight leg raise.  No calf tenderness.   Labs: No results for input(s): HGB in the last 72 hours. No results for input(s): WBC, RBC, HCT, PLT in the last 72 hours. No results for input(s): NA, K, CL, CO2, BUN, CREATININE, GLUCOSE, CALCIUM in the last 72 hours. No results for input(s): LABPT, INR in the last 72 hours.  Assessment/Plan: Pt is POD 1 s/p left TKA.    -Plan to discharge to home today pending how patient does with afternoon PT session.  Okay to go home as long as she is able to mobilize well with no dizziness or lightheadedness.  -WBAT with a walker     Julieanne Cotton 07/31/2020, 12:46 PM

## 2020-07-31 NOTE — Progress Notes (Signed)
Physical Therapy Treatment Patient Details Name: Bethany Kemp MRN: [id number] DOB: 10/06/1960 Today's Date: 07/31/2020    History of Present Illness Pt is a 60 y/o female s/p L TKA on 7/19. PMH includes asthma, DVT, and HTN.    PT Comments    Pt supine in bed and very eager to return home.  Performed stair training this pm and issued HEP for home use.  Pt waiting supine in bed for d/c papers.  Informed nursing bandage is loose.    Follow Up Recommendations  Follow surgeon's recommendation for DC plan and follow-up therapies     Equipment Recommendations  None recommended by PT    Recommendations for Other Services       Precautions / Restrictions Precautions Precautions: Knee Precaution Booklet Issued: No Precaution Comments: Reviewed knee precautions with pt Restrictions Weight Bearing Restrictions: Yes LLE Weight Bearing: Weight bearing as tolerated    Mobility  Bed Mobility Overal bed mobility: Needs Assistance Bed Mobility: Supine to Sit;Sit to Supine     Supine to sit: Supervision Sit to supine: Supervision (used gt belt for back to bed.)   General bed mobility comments: Increased time and effort with self assistance to LLE.    Transfers Overall transfer level: Needs assistance Equipment used: Rolling walker (2 wheeled) Transfers: Sit to/from Stand Sit to Stand: Supervision         General transfer comment: Cues for hand placement and safety.  Ambulation/Gait Ambulation/Gait assistance: Supervision Gait Distance (Feet): 150 Feet Assistive device: Rolling walker (2 wheeled) Gait Pattern/deviations: Decreased weight shift to left;Antalgic;Decreased stride length;Step-through pattern Gait velocity: Decreased   General Gait Details: Continues to maintain step throught pattern, Cues for L heel strike to promote knee extension in stance phase as she presents with a slow buckle but no true LOB.   Stairs Stairs: Yes Stairs assistance:  Supervision Stair Management: No rails;Forwards;With walker Number of Stairs: 2 General stair comments: Curb training x 2 with RW.  Pt required cues for sequencing and RW placement.   Wheelchair Mobility    Modified Rankin (Stroke Patients Only)       Balance Overall balance assessment: Needs assistance Sitting-balance support: No upper extremity supported;Feet supported Sitting balance-Leahy Scale: Good       Standing balance-Leahy Scale: Poor Standing balance comment: Reliant on BUE support                            Cognition Arousal/Alertness: Awake/alert Behavior During Therapy: WFL for tasks assessed/performed Overall Cognitive Status: Within Functional Limits for tasks assessed                                        Exercises Total Joint Exercises Goniometric ROM: 5-70 R knee General Exercises - Lower Extremity Ankle Circles/Pumps: AROM;Both;20 reps;Supine Quad Sets: AROM;Left;10 reps;Supine Heel Slides: AAROM;Left;10 reps;Supine Hip ABduction/ADduction: AAROM;Left;10 reps;Supine Straight Leg Raises: AAROM;Left;10 reps;Supine    General Comments        Pertinent Vitals/Pain Pain Assessment: Faces Faces Pain Scale: Hurts even more Pain Location: anterior left knee Pain Descriptors / Indicators: Burning Pain Intervention(s): Monitored during session;Repositioned    Home Living                      Prior Function            PT Goals (current goals can  now be found in the care plan section) Acute Rehab PT Goals Patient Stated Goal: to be independent Potential to Achieve Goals: Good Progress towards PT goals: Progressing toward goals    Frequency    7X/week      PT Plan Current plan remains appropriate    Co-evaluation              AM-PAC PT "6 Clicks" Mobility   Outcome Measure  Help needed turning from your back to your side while in a flat bed without using bedrails?: A Little Help needed  moving from lying on your back to sitting on the side of a flat bed without using bedrails?: A Little Help needed moving to and from a bed to a chair (including a wheelchair)?: A Little Help needed standing up from a chair using your arms (e.g., wheelchair or bedside chair)?: A Little Help needed to walk in hospital room?: A Little Help needed climbing 3-5 steps with a railing? : A Little 6 Click Score: 18    End of Session Equipment Utilized During Treatment: Gait belt Activity Tolerance: Patient tolerated treatment well Patient left: in chair;with call bell/phone within reach;with family/visitor present Nurse Communication: Mobility status PT Visit Diagnosis: Other abnormalities of gait and mobility (R26.89);Pain Pain - Right/Left: Left Pain - part of body: Knee     Time: 1352-1410 PT Time Calculation (min) (ACUTE ONLY): 18 min  Charges:  $Gait Training: 8-22 mins                     Bonney Leitz , PTA Acute Rehabilitation Services Pager [phone] Office [phone]    Bethany Kemp 07/31/2020, 2:23 PM

## 2020-07-31 NOTE — Telephone Encounter (Signed)
Patient left voicemail on Triage line. She had SU yesterday L TKA. States [facility] on [address] will not fill her Oxycodone. States we need to contact [employer] to authorize RX.  CB [phone]

## 2020-07-31 NOTE — Telephone Encounter (Signed)
[facility]. Patient has already picked up rx. Unable to do PA at this time. Will contact patient to discuss.

## 2020-07-31 NOTE — Progress Notes (Addendum)
Physical Therapy Treatment Patient Details Name: Bethany Kemp MRN: [id number] DOB: 07-02-1960 Today's Date: 07/31/2020    History of Present Illness Pt is a 60 y/o female s/p L TKA on 7/19. PMH includes asthma, DVT, and HTN.    PT Comments    Pt progressing well this am.  Toelrated gt in halls and reviewed HEP from supine level.  Will return in pm for stair training.      Follow Up Recommendations  Follow surgeon's recommendation for DC plan and follow-up therapies     Equipment Recommendations  None recommended by PT    Recommendations for Other Services       Precautions / Restrictions Precautions Precautions: Knee Precaution Booklet Issued: No Precaution Comments: Reviewed knee precautions with pt Restrictions Weight Bearing Restrictions: Yes LLE Weight Bearing: Weight bearing as tolerated    Mobility  Bed Mobility Overal bed mobility: Needs Assistance Bed Mobility: Supine to Sit     Supine to sit: Supervision     General bed mobility comments: Increased time and effort but no assistance needed to move to edge of bed.    Transfers Overall transfer level: Needs assistance Equipment used: Rolling walker (2 wheeled) Transfers: Sit to/from Stand Sit to Stand: Min guard         General transfer comment: Cues for hand placement to and from seated surface this session.  Ambulation/Gait Ambulation/Gait assistance: Min guard Gait Distance (Feet): 150 Feet Assistive device: Rolling walker (2 wheeled) Gait Pattern/deviations: Decreased weight shift to left;Antalgic;Decreased stride length;Step-through pattern     General Gait Details: Pt able to progress immediately to step through pattern.  Pt tolerated increased gt this session well.   Stairs             Wheelchair Mobility    Modified Rankin (Stroke Patients Only)       Balance Overall balance assessment: Needs assistance Sitting-balance support: No upper extremity supported;Feet  supported Sitting balance-Leahy Scale: Good       Standing balance-Leahy Scale: Poor                              Cognition Arousal/Alertness: Awake/alert Behavior During Therapy: WFL for tasks assessed/performed Overall Cognitive Status: Within Functional Limits for tasks assessed                                        Exercises Total Joint Exercises Goniometric ROM: 5-70 R knee General Exercises - Lower Extremity Ankle Circles/Pumps: AROM;Both;20 reps;Supine Quad Sets: AROM;Left;10 reps;Supine Heel Slides: AAROM;Left;10 reps;Supine Hip ABduction/ADduction: AAROM;Left;10 reps;Supine Straight Leg Raises: AAROM;Left;10 reps;Supine    General Comments        Pertinent Vitals/Pain Pain Assessment: Faces Faces Pain Scale: Hurts a little bit Pain Location: anterior left knee Pain Descriptors / Indicators: Burning Pain Intervention(s): Monitored during session;Repositioned    Home Living                      Prior Function            PT Goals (current goals can now be found in the care plan section) Acute Rehab PT Goals Patient Stated Goal: to be independent Potential to Achieve Goals: Good Progress towards PT goals: Progressing toward goals    Frequency    7X/week      PT Plan Current plan remains appropriate  Co-evaluation              AM-PAC PT "6 Clicks" Mobility   Outcome Measure  Help needed turning from your back to your side while in a flat bed without using bedrails?: A Little Help needed moving from lying on your back to sitting on the side of a flat bed without using bedrails?: A Little Help needed moving to and from a bed to a chair (including a wheelchair)?: A Little Help needed standing up from a chair using your arms (e.g., wheelchair or bedside chair)?: A Little Help needed to walk in hospital room?: A Little Help needed climbing 3-5 steps with a railing? : A Little 6 Click Score: 18    End  of Session Equipment Utilized During Treatment: Gait belt Activity Tolerance: Patient tolerated treatment well Patient left: in chair;with call bell/phone within reach;with family/visitor present Nurse Communication: Mobility status PT Visit Diagnosis: Other abnormalities of gait and mobility (R26.89);Pain Pain - Right/Left: Left Pain - part of body: Knee     Time: 1050-1106 PT Time Calculation (min) (ACUTE ONLY): 16 min  Charges:  $Gait Training: 8-22 mins                     Bonney Leitz , PTA Acute Rehabilitation Services Pager [phone] Office [phone]    Ruari Mudgett AMI MALLY 07/31/2020, 1:44 PM

## 2020-07-31 NOTE — Progress Notes (Signed)
Pt was given AVS discharge summary and went over with her. IV was removed with catheter intact. No DME needed for home use. Home Health already set up. Pt had no further questions.

## 2020-07-31 NOTE — Plan of Care (Signed)
  Problem: Education: Goal: Knowledge of General Education information will improve Description: Including pain rating scale, medication(s)/side effects and non-pharmacologic comfort measures Outcome: Adequate for Discharge   Problem: Health Behavior/Discharge Planning: Goal: Ability to manage health-related needs will improve Outcome: Adequate for Discharge   Problem: Clinical Measurements: Goal: Ability to maintain clinical measurements within normal limits will improve Outcome: Adequate for Discharge Goal: Will remain free from infection Outcome: Adequate for Discharge Goal: Diagnostic test results will improve Outcome: Adequate for Discharge Goal: Respiratory complications will improve Outcome: Adequate for Discharge Goal: Cardiovascular complication will be avoided Outcome: Adequate for Discharge   Problem: Activity: Goal: Risk for activity intolerance will decrease Outcome: Adequate for Discharge   Problem: Nutrition: Goal: Adequate nutrition will be maintained Outcome: Adequate for Discharge   Problem: Coping: Goal: Level of anxiety will decrease Outcome: Adequate for Discharge   Problem: Elimination: Goal: Will not experience complications related to bowel motility Outcome: Adequate for Discharge Goal: Will not experience complications related to urinary retention Outcome: Adequate for Discharge   Problem: Pain Managment: Goal: General experience of comfort will improve Outcome: Adequate for Discharge   Problem: Safety: Goal: Ability to remain free from injury will improve Outcome: Adequate for Discharge   Problem: Skin Integrity: Goal: Risk for impaired skin integrity will decrease Outcome: Adequate for Discharge   Problem: Acute Rehab PT Goals(only PT should resolve) Goal: Pt Will Go Supine/Side To Sit Outcome: Adequate for Discharge Goal: Pt Will Go Sit To Supine/Side Outcome: Adequate for Discharge Goal: Patient Will Transfer Sit To/From  Stand Outcome: Adequate for Discharge Goal: Pt Will Ambulate Outcome: Adequate for Discharge Goal: Pt Will Go Up/Down Stairs Outcome: Adequate for Discharge   

## 2020-08-01 ENCOUNTER — Telehealth: Payer: Self-pay

## 2020-08-01 DIAGNOSIS — Z471 Aftercare following joint replacement surgery: Secondary | ICD-10-CM | POA: Diagnosis not present

## 2020-08-01 DIAGNOSIS — R7303 Prediabetes: Secondary | ICD-10-CM | POA: Diagnosis not present

## 2020-08-01 DIAGNOSIS — I10 Essential (primary) hypertension: Secondary | ICD-10-CM | POA: Diagnosis not present

## 2020-08-01 DIAGNOSIS — Z6838 Body mass index (BMI) 38.0-38.9, adult: Secondary | ICD-10-CM | POA: Diagnosis not present

## 2020-08-01 DIAGNOSIS — J45909 Unspecified asthma, uncomplicated: Secondary | ICD-10-CM | POA: Diagnosis not present

## 2020-08-01 DIAGNOSIS — Z86718 Personal history of other venous thrombosis and embolism: Secondary | ICD-10-CM | POA: Diagnosis not present

## 2020-08-01 DIAGNOSIS — Z96652 Presence of left artificial knee joint: Secondary | ICD-10-CM | POA: Diagnosis not present

## 2020-08-01 DIAGNOSIS — G4730 Sleep apnea, unspecified: Secondary | ICD-10-CM | POA: Diagnosis not present

## 2020-08-01 DIAGNOSIS — D649 Anemia, unspecified: Secondary | ICD-10-CM | POA: Diagnosis not present

## 2020-08-01 DIAGNOSIS — K219 Gastro-esophageal reflux disease without esophagitis: Secondary | ICD-10-CM | POA: Diagnosis not present

## 2020-08-01 DIAGNOSIS — Z7901 Long term (current) use of anticoagulants: Secondary | ICD-10-CM | POA: Diagnosis not present

## 2020-08-01 NOTE — Telephone Encounter (Signed)
Pt called asking if she needs compression stockings ? Since she has had a blood clot in the past ?

## 2020-08-01 NOTE — Telephone Encounter (Signed)
Discussed with Dr August Saucer. He would like patient to wear TEDS> I advised patient of this. She verbalized understanding. She will by a pair OTC

## 2020-08-01 NOTE — Op Note (Signed)
NAME: Bethany Kemp, Bethany Kemp MEDICAL RECORD NO: [id number] ACCOUNT NO: [id number] DATE OF BIRTH: Dec 14, 1960 FACILITY: [facility] LOCATION: [id number] PHYSICIAN: Graylin Shiver. August Saucer, MD  Operative Report   DATE OF PROCEDURE: 07/30/2020  PREOPERATIVE DIAGNOSIS:  Left knee arthritis.  POSTOPERATIVE DIAGNOSIS:  Left knee arthritis.  PROCEDURE:  Left knee replacement using Stryker press-fit size 3 femur, size 2 tibia, 13 mm polyethylene insert, 29 mm press-fit 3-peg patella, polyethylene insert is deep-dish.  SURGEON:  [name], MD  ASSISTANT:  Karenann Cai, PA  INDICATIONS:  The patient is a 60 year old patient with left knee arthritis refractory to nonoperative management, who presents for operative management after explanation of risks and benefits.  DESCRIPTION OF PROCEDURE:  The patient was brought to the operating room where spinal anesthetic was induced.  Preoperative antibiotics administered.  Timeout was called.  Left knee prescrubbed with alcohol and Betadine, allowed to air dry.  Prepped with  DuraPrep solution and draped in a sterile manner.  Ioban used to cover the operative field.  The patient had only about range of motion of 5-95 degrees.  Next, the leg was elevated and exsanguinated with the Esmarch wrap.  A timeout was called.   Tourniquet was inflated to 300 mmHg.  Total tourniquet time 84 minutes.  Anterior approach to knee was made.  Skin and subcutaneous tissue were sharply divided.  Median parapatellar approach was made, marked with #1 Vicryl suture.  The patient had some  synovitis within the knee and suprapatellar pouch which was excised.  Fat pad partially excised.  Medial soft tissue dissection was performed in accordance with the amount of mild varus deformity the patient had preoperatively.  Lateral patellofemoral  ligament was released.  Tissue was removed from the anterior distal femur.  ACL was released.  Knee was flexed.  Intramedullary alignment was used to make a cut of 9  mm off the least affected lateral tibial plateau, with the posterior neurovascular  structures and collaterals protected.  Intramedullary alignment was then used to make a cut in 5 degrees valgus on the distal femur.  Bone quality was excellent.  Intramedullary alignment was then used to cut the distal femur, first 8 mm, then 10 mm and  that allowed both the 9 and 11 mm spacer to achieve full extension.  Next, the femur was sized to a size 3, with 3 degrees of external rotation, the flexion gap was balanced, cuts were made.  Tibia was then placed, which was a size 2. Tibia was keel  punched in the appropriate rotation which was marked, femur was placed and then at this time, menisci were removed.  Osteophytes and loose bodies from the posterior aspect of the knee were removed, which definitely assisted in full extension.  The  patient had good stability to varus and valgus stress at 0, 30 and 90 degrees as well as good patellar tracking with the 13 spacer.  Patella was then cut down from 20 down to 11 mm and the patellar trial button was placed with excellent stability and  range of motion achieved.  Tibial keel punch was completed.  Thorough irrigation was performed.  Tranexamic acid sponge was allowed to sit for 3 minutes in the incision along with the IrriSept solution which was used after the incision and after the  arthrotomy.  Next, the true components were placed.  Vancomycin powder placed within the tibial canal.  Excellent press fit obtained.  Same stability parameters were maintained.  Tourniquet was released.  Bleeding points encountered  were controlled using  electrocautery.  Thorough irrigation was performed, after tourniquet release, the arthrotomy was then closed over bolster using #1 Vicryl suture followed by interrupted inverted 0 Vicryl suture.  Prior to final arthrotomy closure, IrriSept solution was  utilized.  Vancomycin powder was then placed into the arthrotomy, which was then closed.   The IrriSept solution then used on top of the arthrotomy with vancomycin powder as well.  Incision then closed using 0 Vicryl suture, 2-0 Vicryl suture, and 3-0  Monocryl with Steri-Strips and Aquacel dressing.  A solution of Marcaine, morphine, clonidine injected into the knee after the arthrotomy closure.  IceMan and knee immobilizer placed.  The patient tolerated the procedure well without immediate  complication and transferred to recovery room in stable condition.  Luke's assistance was required at all times for retraction, opening, closing, mobilization of tissue.  His assistance was a medical necessity.   SHW D: 07/30/2020 10:46:39 am T: 07/30/2020 11:15:00 am  JOB: [id number]/ [id number]

## 2020-08-01 NOTE — Telephone Encounter (Signed)
Notified pt. 

## 2020-08-02 ENCOUNTER — Telehealth: Payer: Self-pay | Admitting: *Deleted

## 2020-08-02 NOTE — Telephone Encounter (Signed)
Requested to assist with getting patient a BSC/3in1 after being discharged from hospital on 07/31/20. She states she didn't think she would need it when they brought her CPM and FWW, but she does. Order placed through [employer] with [employer] yesterday. DME ready for pick up by family today. Patient aware she can pick up at retail location in [place]. Also spoke with hospital staff on [facility] yesterday, who did not provide patient with TED hose after surgery. They were able to provide a pair from stock that patient's daughter did pick up yesterday at hospital.

## 2020-08-03 DIAGNOSIS — J45909 Unspecified asthma, uncomplicated: Secondary | ICD-10-CM | POA: Diagnosis not present

## 2020-08-03 DIAGNOSIS — I10 Essential (primary) hypertension: Secondary | ICD-10-CM | POA: Diagnosis not present

## 2020-08-03 DIAGNOSIS — Z96652 Presence of left artificial knee joint: Secondary | ICD-10-CM | POA: Diagnosis not present

## 2020-08-03 DIAGNOSIS — Z6838 Body mass index (BMI) 38.0-38.9, adult: Secondary | ICD-10-CM | POA: Diagnosis not present

## 2020-08-03 DIAGNOSIS — Z86718 Personal history of other venous thrombosis and embolism: Secondary | ICD-10-CM | POA: Diagnosis not present

## 2020-08-03 DIAGNOSIS — Z471 Aftercare following joint replacement surgery: Secondary | ICD-10-CM | POA: Diagnosis not present

## 2020-08-03 DIAGNOSIS — Z7901 Long term (current) use of anticoagulants: Secondary | ICD-10-CM | POA: Diagnosis not present

## 2020-08-03 DIAGNOSIS — G4730 Sleep apnea, unspecified: Secondary | ICD-10-CM | POA: Diagnosis not present

## 2020-08-03 DIAGNOSIS — R7303 Prediabetes: Secondary | ICD-10-CM | POA: Diagnosis not present

## 2020-08-03 DIAGNOSIS — D649 Anemia, unspecified: Secondary | ICD-10-CM | POA: Diagnosis not present

## 2020-08-03 DIAGNOSIS — K219 Gastro-esophageal reflux disease without esophagitis: Secondary | ICD-10-CM | POA: Diagnosis not present

## 2020-08-05 DIAGNOSIS — D649 Anemia, unspecified: Secondary | ICD-10-CM | POA: Diagnosis not present

## 2020-08-05 DIAGNOSIS — I10 Essential (primary) hypertension: Secondary | ICD-10-CM | POA: Diagnosis not present

## 2020-08-05 DIAGNOSIS — Z7901 Long term (current) use of anticoagulants: Secondary | ICD-10-CM | POA: Diagnosis not present

## 2020-08-05 DIAGNOSIS — J45909 Unspecified asthma, uncomplicated: Secondary | ICD-10-CM | POA: Diagnosis not present

## 2020-08-05 DIAGNOSIS — Z6838 Body mass index (BMI) 38.0-38.9, adult: Secondary | ICD-10-CM | POA: Diagnosis not present

## 2020-08-05 DIAGNOSIS — Z86718 Personal history of other venous thrombosis and embolism: Secondary | ICD-10-CM | POA: Diagnosis not present

## 2020-08-05 DIAGNOSIS — Z471 Aftercare following joint replacement surgery: Secondary | ICD-10-CM | POA: Diagnosis not present

## 2020-08-05 DIAGNOSIS — K219 Gastro-esophageal reflux disease without esophagitis: Secondary | ICD-10-CM | POA: Diagnosis not present

## 2020-08-05 DIAGNOSIS — G4730 Sleep apnea, unspecified: Secondary | ICD-10-CM | POA: Diagnosis not present

## 2020-08-05 DIAGNOSIS — Z96652 Presence of left artificial knee joint: Secondary | ICD-10-CM | POA: Diagnosis not present

## 2020-08-05 DIAGNOSIS — R7303 Prediabetes: Secondary | ICD-10-CM | POA: Diagnosis not present

## 2020-08-07 ENCOUNTER — Encounter: Payer: Federal, State, Local not specified - PPO | Admitting: Orthopedic Surgery

## 2020-08-07 DIAGNOSIS — Z7901 Long term (current) use of anticoagulants: Secondary | ICD-10-CM | POA: Diagnosis not present

## 2020-08-07 DIAGNOSIS — R7303 Prediabetes: Secondary | ICD-10-CM | POA: Diagnosis not present

## 2020-08-07 DIAGNOSIS — Z471 Aftercare following joint replacement surgery: Secondary | ICD-10-CM | POA: Diagnosis not present

## 2020-08-07 DIAGNOSIS — G4730 Sleep apnea, unspecified: Secondary | ICD-10-CM | POA: Diagnosis not present

## 2020-08-07 DIAGNOSIS — Z86718 Personal history of other venous thrombosis and embolism: Secondary | ICD-10-CM | POA: Diagnosis not present

## 2020-08-07 DIAGNOSIS — J45909 Unspecified asthma, uncomplicated: Secondary | ICD-10-CM | POA: Diagnosis not present

## 2020-08-07 DIAGNOSIS — Z96652 Presence of left artificial knee joint: Secondary | ICD-10-CM | POA: Diagnosis not present

## 2020-08-07 DIAGNOSIS — I10 Essential (primary) hypertension: Secondary | ICD-10-CM | POA: Diagnosis not present

## 2020-08-07 DIAGNOSIS — K219 Gastro-esophageal reflux disease without esophagitis: Secondary | ICD-10-CM | POA: Diagnosis not present

## 2020-08-07 DIAGNOSIS — D649 Anemia, unspecified: Secondary | ICD-10-CM | POA: Diagnosis not present

## 2020-08-07 DIAGNOSIS — Z6838 Body mass index (BMI) 38.0-38.9, adult: Secondary | ICD-10-CM | POA: Diagnosis not present

## 2020-08-09 ENCOUNTER — Telehealth: Payer: Self-pay | Admitting: Orthopedic Surgery

## 2020-08-09 ENCOUNTER — Other Ambulatory Visit: Payer: Self-pay

## 2020-08-09 DIAGNOSIS — Z86718 Personal history of other venous thrombosis and embolism: Secondary | ICD-10-CM | POA: Diagnosis not present

## 2020-08-09 DIAGNOSIS — Z471 Aftercare following joint replacement surgery: Secondary | ICD-10-CM | POA: Diagnosis not present

## 2020-08-09 DIAGNOSIS — J45909 Unspecified asthma, uncomplicated: Secondary | ICD-10-CM | POA: Diagnosis not present

## 2020-08-09 DIAGNOSIS — Z96659 Presence of unspecified artificial knee joint: Secondary | ICD-10-CM

## 2020-08-09 DIAGNOSIS — D649 Anemia, unspecified: Secondary | ICD-10-CM | POA: Diagnosis not present

## 2020-08-09 DIAGNOSIS — M1712 Unilateral primary osteoarthritis, left knee: Secondary | ICD-10-CM

## 2020-08-09 DIAGNOSIS — Z96652 Presence of left artificial knee joint: Secondary | ICD-10-CM | POA: Diagnosis not present

## 2020-08-09 DIAGNOSIS — I10 Essential (primary) hypertension: Secondary | ICD-10-CM | POA: Diagnosis not present

## 2020-08-09 DIAGNOSIS — Z7901 Long term (current) use of anticoagulants: Secondary | ICD-10-CM | POA: Diagnosis not present

## 2020-08-09 DIAGNOSIS — G4730 Sleep apnea, unspecified: Secondary | ICD-10-CM | POA: Diagnosis not present

## 2020-08-09 DIAGNOSIS — Z6838 Body mass index (BMI) 38.0-38.9, adult: Secondary | ICD-10-CM | POA: Diagnosis not present

## 2020-08-09 DIAGNOSIS — K219 Gastro-esophageal reflux disease without esophagitis: Secondary | ICD-10-CM | POA: Diagnosis not present

## 2020-08-09 DIAGNOSIS — R7303 Prediabetes: Secondary | ICD-10-CM | POA: Diagnosis not present

## 2020-08-09 NOTE — Telephone Encounter (Signed)
Patient called advised Matt with [facility] wanted to know when will she start out patient  (PT)? Patient said she would like to start (PT) upstairs with our physical therapist at [facility]. The number to contact patient is [phone]

## 2020-08-12 DIAGNOSIS — I10 Essential (primary) hypertension: Secondary | ICD-10-CM | POA: Diagnosis not present

## 2020-08-12 DIAGNOSIS — K219 Gastro-esophageal reflux disease without esophagitis: Secondary | ICD-10-CM | POA: Diagnosis not present

## 2020-08-12 DIAGNOSIS — G4730 Sleep apnea, unspecified: Secondary | ICD-10-CM | POA: Diagnosis not present

## 2020-08-12 DIAGNOSIS — Z6838 Body mass index (BMI) 38.0-38.9, adult: Secondary | ICD-10-CM | POA: Diagnosis not present

## 2020-08-12 DIAGNOSIS — D649 Anemia, unspecified: Secondary | ICD-10-CM | POA: Diagnosis not present

## 2020-08-12 DIAGNOSIS — Z7901 Long term (current) use of anticoagulants: Secondary | ICD-10-CM | POA: Diagnosis not present

## 2020-08-12 DIAGNOSIS — Z96652 Presence of left artificial knee joint: Secondary | ICD-10-CM | POA: Diagnosis not present

## 2020-08-12 DIAGNOSIS — J45909 Unspecified asthma, uncomplicated: Secondary | ICD-10-CM | POA: Diagnosis not present

## 2020-08-12 DIAGNOSIS — Z86718 Personal history of other venous thrombosis and embolism: Secondary | ICD-10-CM | POA: Diagnosis not present

## 2020-08-12 DIAGNOSIS — Z471 Aftercare following joint replacement surgery: Secondary | ICD-10-CM | POA: Diagnosis not present

## 2020-08-12 DIAGNOSIS — R7303 Prediabetes: Secondary | ICD-10-CM | POA: Diagnosis not present

## 2020-08-14 ENCOUNTER — Ambulatory Visit (INDEPENDENT_AMBULATORY_CARE_PROVIDER_SITE_OTHER): Payer: Federal, State, Local not specified - PPO

## 2020-08-14 ENCOUNTER — Ambulatory Visit (HOSPITAL_COMMUNITY)
Admission: RE | Admit: 2020-08-14 | Discharge: 2020-08-14 | Disposition: A | Discharge status: Home / Self Care | Payer: Federal, State, Local not specified - PPO | Source: Ambulatory Visit | Attending: Surgical | Admitting: Surgical

## 2020-08-14 ENCOUNTER — Encounter: Payer: Self-pay | Admitting: Orthopedic Surgery

## 2020-08-14 ENCOUNTER — Other Ambulatory Visit: Payer: Self-pay

## 2020-08-14 ENCOUNTER — Ambulatory Visit (INDEPENDENT_AMBULATORY_CARE_PROVIDER_SITE_OTHER): Payer: Federal, State, Local not specified - PPO | Admitting: Orthopedic Surgery

## 2020-08-14 DIAGNOSIS — Z471 Aftercare following joint replacement surgery: Secondary | ICD-10-CM | POA: Diagnosis not present

## 2020-08-14 DIAGNOSIS — J45909 Unspecified asthma, uncomplicated: Secondary | ICD-10-CM | POA: Diagnosis not present

## 2020-08-14 DIAGNOSIS — Z86718 Personal history of other venous thrombosis and embolism: Secondary | ICD-10-CM | POA: Diagnosis not present

## 2020-08-14 DIAGNOSIS — R7303 Prediabetes: Secondary | ICD-10-CM | POA: Diagnosis not present

## 2020-08-14 DIAGNOSIS — D649 Anemia, unspecified: Secondary | ICD-10-CM | POA: Diagnosis not present

## 2020-08-14 DIAGNOSIS — G4730 Sleep apnea, unspecified: Secondary | ICD-10-CM | POA: Diagnosis not present

## 2020-08-14 DIAGNOSIS — Z96652 Presence of left artificial knee joint: Secondary | ICD-10-CM

## 2020-08-14 DIAGNOSIS — Z6838 Body mass index (BMI) 38.0-38.9, adult: Secondary | ICD-10-CM | POA: Diagnosis not present

## 2020-08-14 DIAGNOSIS — I10 Essential (primary) hypertension: Secondary | ICD-10-CM | POA: Diagnosis not present

## 2020-08-14 DIAGNOSIS — K219 Gastro-esophageal reflux disease without esophagitis: Secondary | ICD-10-CM | POA: Diagnosis not present

## 2020-08-14 DIAGNOSIS — Z7901 Long term (current) use of anticoagulants: Secondary | ICD-10-CM | POA: Diagnosis not present

## 2020-08-14 NOTE — Progress Notes (Signed)
Post-Op Visit Note   Patient: Bethany Kemp           Date of Birth: [birth year]           MRN: [id number] Visit Date: 08/14/2020 PCP: Pcp, No   Assessment & Plan:  Chief Complaint:  Chief Complaint  Patient presents with   Left Knee - Routine Post Op    07/30/20 left TKA   Visit Diagnoses:  1. S/P total knee arthroplasty, left     Plan: Patient is a 60 year old female who presents s/p left total knee arthroplasty on 07/30/2020.  She is doing well overall.  She only has to take pain medication at night.  She has finished home health physical therapy.  Ambulating with a walker.  She is up to 90 degrees of CPR Chane.  Denies any chest pain, shortness of breath, calf pain.  She still has Xarelto for DVT prophylaxis given her previous history of DVT in the right lower extremity.  She starts physical therapy upstairs tomorrow.  Radiographs of the left knee taken today show left total knee prosthesis in excellent position and alignment without any complicating features.  She is being helped by her daughter who is trying to take FMLA in order to help care for her.  On exam she has well-healed incision without evidence of infection or dehiscence.  No calf tenderness.  Negative Homans' sign.  Able to perform straight leg raise multiple times.  She has about 75 degrees of knee flexion and 3 degrees of knee extension.  She will continue working on knee extension and knee flexion, especially when she starts physical therapy.  Plan for her to follow-up in 4 weeks for clinical recheck.  She did have ultrasound of both lower extremities today that were negative for DVT.  Plan to transition from Xarelto to aspirin daily.  Follow-Up Instructions: No follow-ups on file.   Orders:  Orders Placed This Encounter  Procedures   XR Knee 1-2 Views Left   VAS [place] LOWER EXTREMITY VENOUS (DVT)   No orders of the defined types were placed in this encounter.   Imaging: VAS [place] LOWER EXTREMITY VENOUS  (DVT)  Result Date: 08/14/2020  Lower Venous DVT Study Patient Name:  Bethany Kemp  Date of Exam:   08/14/2020 Medical Rec #: [id number]       Accession #:    [id number] Date of Birth: [birth year]      Patient Gender: F Patient Age:   44Y Exam Location:  [facility] Procedure:      VAS [place] LOWER EXTREMITY VENOUS (DVT) Referring Phys: [id number] CHARLES L MAGNANT --------------------------------------------------------------------------------  Indications: Swelling, s/p LT knee arthroplasty, history of DVT post-RT arthroplasty.  Anticoagulation: Xarelto. Comparison Study: No prior studies available. Performing Technologist: Jean Rosenthal RDMS,RVT  Examination Guidelines: A complete evaluation includes B-mode imaging, spectral Doppler, color Doppler, and power Doppler as needed of all accessible portions of each vessel. Bilateral testing is considered an integral part of a complete examination. Limited examinations for reoccurring indications may be performed as noted. The reflux portion of the exam is performed with the patient in reverse Trendelenburg.  +---------+---------------+---------+-----------+----------+--------------+ RIGHT    CompressibilityPhasicitySpontaneityPropertiesThrombus Aging +---------+---------------+---------+-----------+----------+--------------+ CFV      Full           Yes      Yes                                 +---------+---------------+---------+-----------+----------+--------------+  SFJ      Full                                                        +---------+---------------+---------+-----------+----------+--------------+ FV Prox  Full                                                        +---------+---------------+---------+-----------+----------+--------------+ FV Mid   Full                                                        +---------+---------------+---------+-----------+----------+--------------+ FV DistalFull                                                         +---------+---------------+---------+-----------+----------+--------------+ PFV      Full                                                        +---------+---------------+---------+-----------+----------+--------------+ POP      Full           Yes      Yes                                 +---------+---------------+---------+-----------+----------+--------------+ PTV      Full                                                        +---------+---------------+---------+-----------+----------+--------------+ PERO     Full                                                        +---------+---------------+---------+-----------+----------+--------------+   +---------+---------------+---------+-----------+----------+--------------+ LEFT     CompressibilityPhasicitySpontaneityPropertiesThrombus Aging +---------+---------------+---------+-----------+----------+--------------+ CFV      Full           Yes      Yes                                 +---------+---------------+---------+-----------+----------+--------------+ SFJ      Full                                                        +---------+---------------+---------+-----------+----------+--------------+  FV Prox  Full                                                        +---------+---------------+---------+-----------+----------+--------------+ FV Mid   Full                                                        +---------+---------------+---------+-----------+----------+--------------+ FV DistalFull                                                        +---------+---------------+---------+-----------+----------+--------------+ PFV      Full                                                        +---------+---------------+---------+-----------+----------+--------------+ POP      Full           Yes      Yes                                  +---------+---------------+---------+-----------+----------+--------------+ PTV      Full                                                        +---------+---------------+---------+-----------+----------+--------------+ PERO     Full                                                        +---------+---------------+---------+-----------+----------+--------------+     Summary: RIGHT: - There is no evidence of deep vein thrombosis in the lower extremity.  - No cystic structure found in the popliteal fossa.  LEFT: - There is no evidence of deep vein thrombosis in the lower extremity.  - No cystic structure found in the popliteal fossa.  *See table(s) above for measurements and observations. Electronically signed by Coral Else MD on 08/14/2020 at 4:28:53 PM.    Final     PMFS History: Patient Active Problem List   Diagnosis Date Noted   Arthritis of left knee    S/P total knee arthroplasty, left 07/30/2020   Unilateral primary osteoarthritis, left knee 05/07/2016   Abnormal stress ECG with treadmill 07/30/2014   Palpitation 06/22/2014   Dyspnea 06/22/2014   Past Medical History:  Diagnosis Date   Anemia    Arthritis    Asthma    Complication of anesthesia    Diverticulitis    DVT (deep venous thrombosis) (HCC)    after knee surgery  Family history of adverse reaction to anesthesia    sister had a hard time waking up   Gastritis    1980's was hospitalized   GERD (gastroesophageal reflux disease)    Heart murmur    History of hiatal hernia    Hypertension    Obesities, morbid (HCC)    PONV (postoperative nausea and vomiting)    Pre-diabetes    Sleep apnea    BiPap    Family History  Problem Relation Age of Onset   Asthma Father    Heart attack Mother 31       Died with MI age 44   Arthritis Mother    Diabetes Mother    Stroke Sister    Breast cancer Sister    Asthma Daughter     Past Surgical History:  Procedure Laterality Date   BREAST REDUCTION SURGERY      BREAST SURGERY     BUNIONECTOMY  01/13/1991   bilateral   CARDIAC CATHETERIZATION N/A 07/30/2014   Procedure: Left Heart Cath and Coronary Angiography;  Surgeon: Tonny Bollman, MD;  Location: [facility] INVASIVE CV LAB;  Service: Cardiovascular;  Laterality: N/A;   CHOLECYSTECTOMY  01/13/2003   HERNIA REPAIR  01/13/1980   KNEE SURGERY  01/12/2001   Righ knee -- chondal implant   TOTAL KNEE ARTHROPLASTY Left 07/30/2020   Procedure: LEFT TOTAL KNEE ARTHROPLASTY;  Surgeon: [name], MD;  Location: [facility] OR;  Service: Orthopedics;  Laterality: Left;   Social History   Occupational History   Not on file  Tobacco Use   Smoking status: Never   Smokeless tobacco: Never  Vaping Use   Vaping Use: Never used  Substance and Sexual Activity   Alcohol use: No   Drug use: No   Sexual activity: Not on file

## 2020-08-14 NOTE — Progress Notes (Signed)
Lower extremity venous bilateral study completed.  Preliminary results relayed to provider.  See CV Proc for preliminary results report.   Jean Rosenthal, RDMS, RVT

## 2020-08-15 ENCOUNTER — Encounter: Payer: Self-pay | Admitting: Orthopedic Surgery

## 2020-08-15 ENCOUNTER — Ambulatory Visit (INDEPENDENT_AMBULATORY_CARE_PROVIDER_SITE_OTHER): Payer: Federal, State, Local not specified - PPO | Admitting: Physical Therapy

## 2020-08-15 ENCOUNTER — Encounter: Payer: Self-pay | Admitting: Physical Therapy

## 2020-08-15 DIAGNOSIS — M25562 Pain in left knee: Secondary | ICD-10-CM

## 2020-08-15 DIAGNOSIS — M6281 Muscle weakness (generalized): Secondary | ICD-10-CM

## 2020-08-15 DIAGNOSIS — M25662 Stiffness of left knee, not elsewhere classified: Secondary | ICD-10-CM | POA: Diagnosis not present

## 2020-08-15 DIAGNOSIS — R600 Localized edema: Secondary | ICD-10-CM

## 2020-08-15 DIAGNOSIS — R2689 Other abnormalities of gait and mobility: Secondary | ICD-10-CM

## 2020-08-15 NOTE — Patient Instructions (Signed)
Access Code: [email or web address] URL: [email or web address] Date: 08/15/2020 Prepared by: Moshe Cipro  Exercises Quad Set - 8-10 x daily - 7 x weekly - 1 sets - 5-10 reps - 5 sec hold Supine Heel Slide with Strap - 5-10 x daily - 7 x weekly - 1 sets - 5-10 reps Seated Knee Flexion AAROM - 5-10 x daily - 7 x weekly - 1 sets - 10 reps - 10 sec hold Seated Knee Extension AROM - 5-10 x daily - 7 x weekly - 1 sets - 10 reps - 5 sec hold

## 2020-08-15 NOTE — Discharge Summary (Signed)
Physician Discharge Summary      Patient ID: Bethany Kemp MRN: [id number] DOB/AGE: 01-31-1960 60 y.o.  Admit date: 07/30/2020 Discharge date: 07/31/2020  Admission Diagnoses:  Active Problems:   S/P total knee arthroplasty, left   Arthritis of left knee   Discharge Diagnoses:  Same  Surgeries: Procedure(s): LEFT TOTAL KNEE ARTHROPLASTY on 07/30/2020   Consultants:   Discharged Condition: Stable  Hospital Course: Bethany Kemp is an 60 y.o. female who was admitted 07/30/2020 with a chief complaint of left knee pain, and found to have a diagnosis of left knee osteoarthritis.  They were brought to the operating room on 07/30/2020 and underwent the above named procedures.  Pt awoke from anesthesia without complication and was transferred to the floor. On POD1, patient was able to mobilize well with physical therapy.  She was on Xarelto because of prior history of DVT.  She had no complaint of dizziness, chest pain, shortness of breath.  She was discharged home on POD 1..  Pt will f/u with Dr. August Saucer in clinic in ~2 weeks.   Antibiotics given:  Anti-infectives (From admission, onward)    Start     Dose/Rate Route Frequency Ordered Stop   07/30/20 1530  ceFAZolin (ANCEF) IVPB 2g/100 mL premix        2 g 200 mL/hr over 30 Minutes Intravenous Every 8 hours 07/30/20 1222 07/30/20 2249   07/30/20 1004  vancomycin (VANCOCIN) powder  Status:  Discontinued          As needed 07/30/20 1005 07/30/20 1020   07/30/20 0600  ceFAZolin (ANCEF) IVPB 2g/100 mL premix        2 g 200 mL/hr over 30 Minutes Intravenous On call to O.R. 07/30/20 0546 07/30/20 0730     .  Recent vital signs:  Vitals:   07/31/20 0617 07/31/20 0700  BP: 119/73 (!) 162/86  Pulse: 72 84  Resp: 17 16  Temp: 98.5 F (36.9 C) 98.6 F (37 C)  SpO2: 100% 100%    Recent laboratory studies:  Results for orders placed or performed during the hospital encounter of 07/30/20  Glucose, capillary  Result Value Ref Range    Glucose-Capillary 95 70 - 99 mg/dL  Glucose, capillary  Result Value Ref Range   Glucose-Capillary 79 70 - 99 mg/dL   Comment 1 Notify RN    Comment 2 Document in Chart   Glucose, capillary  Result Value Ref Range   Glucose-Capillary 97 70 - 99 mg/dL  Glucose, capillary  Result Value Ref Range   Glucose-Capillary 97 70 - 99 mg/dL    Discharge Medications:   Allergies as of 07/31/2020       Reactions   Ibuprofen    GI upset   Lisinopril Cough   Acetaminophen Other (See Comments)   GI upset   Aspirin Other (See Comments)   High dose aspirin irritates stomach   Spironolactone Rash        Medication List     STOP taking these medications    meloxicam 15 MG tablet Commonly known as: MOBIC   metroNIDAZOLE 500 MG tablet Commonly known as: Flagyl   ondansetron 4 MG tablet Commonly known as: ZOFRAN       TAKE these medications    albuterol 108 (90 Base) MCG/ACT inhaler Commonly known as: VENTOLIN HFA Inhale 2 puffs into the lungs every 4 (four) hours as needed for wheezing or shortness of breath.   amitriptyline 25 MG tablet Commonly known as: ELAVIL Take  75 mg by mouth at bedtime.   amLODipine 10 MG tablet Commonly known as: NORVASC Take 1 tablet (10 mg total) by mouth daily.   atenolol 50 MG tablet Commonly known as: TENORMIN Take 50 mg by mouth daily. What changed: Another medication with the same name was removed. Continue taking this medication, and follow the directions you see here.   cholecalciferol 25 MCG (1000 UNIT) tablet Commonly known as: VITAMIN D Take 2,000 Units by mouth daily.   diclofenac Sodium 1 % Gel Commonly known as: VOLTAREN Apply 2 g topically daily as needed (pain).   fluticasone 50 MCG/ACT nasal spray Commonly known as: FLONASE Place 2 sprays into both nostrils daily as needed for allergies.   gabapentin 400 MG capsule Commonly known as: NEURONTIN Take 400 mg by mouth at bedtime as needed (pain).    hydrochlorothiazide 25 MG tablet Commonly known as: HYDRODIURIL Take 12.5 tablets by mouth daily as needed (fluid).   hydroxypropyl methylcellulose / hypromellose 2.5 % ophthalmic solution Commonly known as: ISOPTO TEARS / GONIOVISC Place 1 drop into both eyes 3 (three) times daily as needed for dry eyes.   loratadine 10 MG tablet Commonly known as: CLARITIN Take 10 mg by mouth daily as needed for allergies.   losartan 50 MG tablet Commonly known as: COZAAR Take 50 mg by mouth daily.   methocarbamol 500 MG tablet Commonly known as: ROBAXIN Take 1 tablet (500 mg total) by mouth every 8 (eight) hours as needed for muscle spasms.   mometasone 220 MCG/INH inhaler Commonly known as: ASMANEX Inhale 2 puffs into the lungs at bedtime.   omeprazole 20 MG capsule Commonly known as: PRILOSEC Take 20 mg by mouth 2 (two) times daily.   oxyCODONE 5 MG immediate release tablet Commonly known as: Oxy IR/ROXICODONE Take 1-2 tablets (5-10 mg total) by mouth every 4 (four) hours as needed for moderate pain (pain score 4-6).   predniSONE 5 MG tablet Commonly known as: DELTASONE Take 5 mg by mouth daily with breakfast. What changed: Another medication with the same name was removed. Continue taking this medication, and follow the directions you see here.   rivaroxaban 10 MG Tabs tablet Commonly known as: XARELTO Take 1 tablet (10 mg total) by mouth at bedtime.        Diagnostic Studies: XR Knee 1-2 Views Left  Result Date: 08/15/2020 AP and lateral views of left knee reviewed.  Left total knee prosthesis in excellent position and alignment without any complicating features.  No lucency noted between the bone and prosthesis.  No periprosthetic fracture noted.  No dislocation.  VAS [place] LOWER EXTREMITY VENOUS (DVT)  Result Date: 08/14/2020  Lower Venous DVT Study Patient Name:  SORAH FALKENSTEIN  Date of Exam:   08/14/2020 Medical Rec #: [id number]       Accession #:    [id number] Date of Birth:  04/23/1960      Patient Gender: F Patient Age:   53Y Exam Location:  [facility] Procedure:      VAS [place] LOWER EXTREMITY VENOUS (DVT) Referring Phys: [id number] Charlcie Prisco L Guneet Delpino --------------------------------------------------------------------------------  Indications: Swelling, s/p LT knee arthroplasty, history of DVT post-RT arthroplasty.  Anticoagulation: Xarelto. Comparison Study: No prior studies available. Performing Technologist: Jean Rosenthal RDMS,RVT  Examination Guidelines: A complete evaluation includes B-mode imaging, spectral Doppler, color Doppler, and power Doppler as needed of all accessible portions of each vessel. Bilateral testing is considered an integral part of a complete examination. Limited examinations for reoccurring indications may  be performed as noted. The reflux portion of the exam is performed with the patient in reverse Trendelenburg.  +---------+---------------+---------+-----------+----------+--------------+ RIGHT    CompressibilityPhasicitySpontaneityPropertiesThrombus Aging +---------+---------------+---------+-----------+----------+--------------+ CFV      Full           Yes      Yes                                 +---------+---------------+---------+-----------+----------+--------------+ SFJ      Full                                                        +---------+---------------+---------+-----------+----------+--------------+ FV Prox  Full                                                        +---------+---------------+---------+-----------+----------+--------------+ FV Mid   Full                                                        +---------+---------------+---------+-----------+----------+--------------+ FV DistalFull                                                        +---------+---------------+---------+-----------+----------+--------------+ PFV      Full                                                         +---------+---------------+---------+-----------+----------+--------------+ POP      Full           Yes      Yes                                 +---------+---------------+---------+-----------+----------+--------------+ PTV      Full                                                        +---------+---------------+---------+-----------+----------+--------------+ PERO     Full                                                        +---------+---------------+---------+-----------+----------+--------------+   +---------+---------------+---------+-----------+----------+--------------+ LEFT     CompressibilityPhasicitySpontaneityPropertiesThrombus Aging +---------+---------------+---------+-----------+----------+--------------+ CFV      Full  Yes      Yes                                 +---------+---------------+---------+-----------+----------+--------------+ SFJ      Full                                                        +---------+---------------+---------+-----------+----------+--------------+ FV Prox  Full                                                        +---------+---------------+---------+-----------+----------+--------------+ FV Mid   Full                                                        +---------+---------------+---------+-----------+----------+--------------+ FV DistalFull                                                        +---------+---------------+---------+-----------+----------+--------------+ PFV      Full                                                        +---------+---------------+---------+-----------+----------+--------------+ POP      Full           Yes      Yes                                 +---------+---------------+---------+-----------+----------+--------------+ PTV      Full                                                         +---------+---------------+---------+-----------+----------+--------------+ PERO     Full                                                        +---------+---------------+---------+-----------+----------+--------------+     Summary: RIGHT: - There is no evidence of deep vein thrombosis in the lower extremity.  - No cystic structure found in the popliteal fossa.  LEFT: - There is no evidence of deep vein thrombosis in the lower extremity.  - No cystic structure found in the popliteal fossa.  *See table(s) above for measurements and observations. Electronically signed by Coral Else MD on 08/14/2020 at 4:28:53  PM.    Final     Disposition: Discharge disposition: 01-Home or Self Care       Discharge Instructions     Call MD / Call 911   Complete by: As directed    If you experience chest pain or shortness of breath, CALL 911 and be transported to the hospital emergency room.  If you develope a fever above 101 F, pus (white drainage) or increased drainage or redness at the wound, or calf pain, call your surgeon's office.   Constipation Prevention   Complete by: As directed    Drink plenty of fluids.  Prune juice may be helpful.  You may use a stool softener, such as Colace (over the counter) 100 mg twice a day.  Use MiraLax (over the counter) for constipation as needed.   Diet - low sodium heart healthy   Complete by: As directed    Discharge instructions   Complete by: As directed    You may shower, dressing is waterproof.  Do not remove the dressing, we will remove it at your first post-op appointment.  If there are any issues with the dressing, call the office. Do not take a bath or soak the knee in a tub or pool.  You may weightbear as you can tolerate on the operative leg with a walker.  Continue using the CPM machine 3 times per day for one hour each time, increasing the degrees of range of motion daily.  Use the blue cradle boot under your heel to work on getting your leg straight.   Do NOT put a pillow under your knee.  You will follow-up with Dr. Dean or FAugust Sauceranky Macho in the clinic in 2 weeks at your given appointment date.    INSTRUCTIONS AFTER JOINT REPLACEMENT   Remove items at home which could result in a fall. This includes throw rugs or furniture in walking pathways ICE to the affected joint every three hours while awake for 30 minutes at a time, for at least the first 3-5 days, and then as needed for pain and swelling.  Continue to use ice for pain and swelling. You may notice swelling that will progress down to the foot and ankle.  This is normal after surgery.  Elevate your leg when you are not up walking on it.   Continue to use the breathing machine you got in the hospital (incentive spirometer) which will help keep your temperature down.  It is common for your temperature to cycle up and down following surgery, especially at night when you are not up moving around and exerting yourself.  The breathing machine keeps your lungs expanded and your temperature down.   DIET:  As you were doing prior to hospitalization, we recommend a well-balanced diet.  DRESSING / WOUND CARE / SHOWERING  Keep the surgical dressing until follow up.  The dressing is water proof, so you can shower without any extra covering.  IF THE DRESSING FALLS OFF or the wound gets wet inside, change the dressing with sterile gauze.  Please use good hand washing techniques before changing the dressing.  Do not use any lotions or creams on the incision until instructed by your surgeon.    ACTIVITY  Increase activity slowly as tolerated, but follow the weight bearing instructions below.   No driving for 6 weeks or until further direction given by your physician.  You cannot drive while taking narcotics.  No lifting or carrying greater than 10 lbs. until further directed by  your surgeon. Avoid periods of inactivity such as sitting longer than an hour when not asleep. This helps prevent blood clots.  You may  return to work once you are authorized by your doctor.     WEIGHT BEARING   Weight bearing as tolerated with assist device (walker, cane, etc) as directed, use it as long as suggested by your surgeon or therapist, typically at least 4-6 weeks.   EXERCISES  Results after joint replacement surgery are often greatly improved when you follow the exercise, range of motion and muscle strengthening exercises prescribed by your doctor. Safety measures are also important to protect the joint from further injury. Any time any of these exercises cause you to have increased pain or swelling, decrease what you are doing until you are comfortable again and then slowly increase them. If you have problems or questions, call your caregiver or physical therapist for advice.   Rehabilitation is important following a joint replacement. After just a few days of immobilization, the muscles of the leg can become weakened and shrink (atrophy).  These exercises are designed to build up the tone and strength of the thigh and leg muscles and to improve motion. Often times heat used for twenty to thirty minutes before working out will loosen up your tissues and help with improving the range of motion but do not use heat for the first two weeks following surgery (sometimes heat can increase post-operative swelling).   These exercises can be done on a training (exercise) mat, on the floor, on a table or on a bed. Use whatever works the best and is most comfortable for you.    Use music or television while you are exercising so that the exercises are a pleasant break in your day. This will make your life better with the exercises acting as a break in your routine that you can look forward to.   Perform all exercises about fifteen times, three times per day or as directed.  You should exercise both the operative leg and the other leg as well.  Exercises include:   Quad Sets - Tighten up the muscle on the front of the thigh  (Quad) and hold for 5-10 seconds.   Straight Leg Raises - With your knee straight (if you were given a brace, keep it on), lift the leg to 60 degrees, hold for 3 seconds, and slowly lower the leg.  Perform this exercise against resistance later as your leg gets stronger.  Leg Slides: Lying on your back, slowly slide your foot toward your buttocks, bending your knee up off the floor (only go as far as is comfortable). Then slowly slide your foot back down until your leg is flat on the floor again.  Angel Wings: Lying on your back spread your legs to the side as far apart as you can without causing discomfort.  Hamstring Strength:  Lying on your back, push your heel against the floor with your leg straight by tightening up the muscles of your buttocks.  Repeat, but this time bend your knee to a comfortable angle, and push your heel against the floor.  You may put a pillow under the heel to make it more comfortable if necessary.   A rehabilitation program following joint replacement surgery can speed recovery and prevent re-injury in the future due to weakened muscles. Contact your doctor or a physical therapist for more information on knee rehabilitation.    CONSTIPATION  Constipation is defined medically as fewer than three stools  per week and severe constipation as less than one stool per week.  Even if you have a regular bowel pattern at home, your normal regimen is likely to be disrupted due to multiple reasons following surgery.  Combination of anesthesia, postoperative narcotics, change in appetite and fluid intake all can affect your bowels.   YOU MUST use at least one of the following options; they are listed in order of increasing strength to get the job done.  They are all available over the counter, and you may need to use some, POSSIBLY even all of these options:    Drink plenty of fluids (prune juice may be helpful) and high fiber foods Colace 100 mg by mouth twice a day  Senokot for  constipation as directed and as needed Dulcolax (bisacodyl), take with full glass of water  Miralax (polyethylene glycol) once or twice a day as needed.  If you have tried all these things and are unable to have a bowel movement in the first 3-4 days after surgery call either your surgeon or your primary doctor.    If you experience loose stools or diarrhea, hold the medications until you stool forms back up.  If your symptoms do not get better within 1 week or if they get worse, check with your doctor.  If you experience "the worst abdominal pain ever" or develop nausea or vomiting, please contact the office immediately for further recommendations for treatment.   ITCHING:  If you experience itching with your medications, try taking only a single pain pill, or even half a pain pill at a time.  You can also use Benadryl over the counter for itching or also to help with sleep.   TED HOSE STOCKINGS:  Use stockings on both legs until for at least 2 weeks or as directed by physician office. They may be removed at night for sleeping.  MEDICATIONS:  See your medication summary on the "After Visit Summary" that nursing will review with you.  You may have some home medications which will be placed on hold until you complete the course of blood thinner medication.  It is important for you to complete the blood thinner medication as prescribed.  PRECAUTIONS:  If you experience chest pain or shortness of breath - call 911 immediately for transfer to the hospital emergency department.   If you develop a fever greater that 101 F, purulent drainage from wound, increased redness or drainage from wound, foul odor from the wound/dressing, or calf pain - CONTACT YOUR SURGEON.                                                   FOLLOW-UP APPOINTMENTS:  If you do not already have a post-op appointment, please call the office for an appointment to be seen by your surgeon.  Guidelines for how soon to be seen are listed in  your "After Visit Summary", but are typically between 1-4 weeks after surgery.  OTHER INSTRUCTIONS:   Knee Replacement:  Do not place pillow under knee, focus on keeping the knee straight while resting. CPM instructions: 0-90 degrees, 2 hours in the morning, 2 hours in the afternoon, and 2 hours in the evening. Place foam block, curve side up under heel at all times except when in CPM or when walking.  DO NOT modify, tear, cut, or change the  foam block in any way.  POST-OPERATIVE OPIOID TAPER INSTRUCTIONS: It is important to wean off of your opioid medication as soon as possible. If you do not need pain medication after your surgery it is ok to stop day one. Opioids include: Codeine, Hydrocodone(Norco, Vicodin), Oxycodone(Percocet, oxycontin) and hydromorphone amongst others.  Long term and even short term use of opiods can cause: Increased pain response Dependence Constipation Depression Respiratory depression And more.  Withdrawal symptoms can include Flu like symptoms Nausea, vomiting And more Techniques to manage these symptoms Hydrate well Eat regular healthy meals Stay active Use relaxation techniques(deep breathing, meditating, yoga) Do Not substitute Alcohol to help with tapering If you have been on opioids for less than two weeks and do not have pain than it is ok to stop all together.  Plan to wean off of opioids This plan should start within one week post op of your joint replacement. Maintain the same interval or time between taking each dose and first decrease the dose.  Cut the total daily intake of opioids by one tablet each day Next start to increase the time between doses. The last dose that should be eliminated is the evening dose.   MAKE SURE YOU:  Understand these instructions.  Get help right away if you are not doing well or get worse.    Thank you for letting us be a part of your medical care team.  It is a privilege we respect greatly.  We hope these  instructions will help you stay on track for a fast and full recovery!    Dental Antibiotics:  In most cases prophylactic antibiotics for Dental procdeures after total joint surgery are not necessary.  Exceptions are as follows:  1. History of prior total joint infection  2. Severely immunocompromised (Organ Transplant, cancer chemotherapy, Rheumatoid biologic meds such as Humera)  3. Poorly controlled diabetes (A1C &gt; 8.0, blood glucose over 200)  If you have one of these conditions, contact your surgeon for an antibiotic prescription, prior to your dental procedure.   Increase activity slowly as tolerated   Complete by: As directed    Post-operative opioid taper instructions:   Complete by: As directed    POST-OPERATIVE OPIOID TAPER INSTRUCTIONS: It is important to wean off of your opioid medication as soon as possible. If you do not need pain medication after your surgery it is ok to stop day one. Opioids include: Codeine, Hydrocodone(Norco, Vicodin), Oxycodone(Percocet, oxycontin) and hydromorphone amongst others.  Long term and even short term use of opiods can cause: Increased pain response Dependence Constipation Depression Respiratory depression And more.  Withdrawal symptoms can include Flu like symptoms Nausea, vomiting And more Techniques to manage these symptoms Hydrate well Eat regular healthy meals Stay active Use relaxation techniques(deep breathing, meditating, yoga) Do Not substitute Alcohol to help with tapering If you have been on opioids for less than two weeks and do not have pain than it is ok to stop all together.  Plan to wean off of opioids This plan should start within one week post op of your joint replacement. Maintain the same interval or time between taking each dose and first decrease the dose.  Cut the total daily intake of opioids by one tablet each day Next start to increase the time between doses. The last dose that should be  eliminated is the evening dose.             SignedJulieanne Cotton 08/15/2020, 10:49 AM

## 2020-08-15 NOTE — Therapy (Signed)
[facility] Physical Therapy [address] [place], [place], [address] Phone: [phone]   Fax:  [phone]  Physical Therapy Evaluation  Patient Details  Name: Bethany Kemp MRN: [id number] Date of Birth: 09/05/1960 Referring Provider (PT): August Saucer Corrie Mckusick, MD   Encounter Date: 08/15/2020   PT End of Session - 08/15/20 1136     Visit Number 1    Number of Visits 16    Date for PT Re-Evaluation 10/10/20    Progress Note Due on Visit 10    PT Start Time 1055    PT Stop Time 1133    PT Time Calculation (min) 38 min    Activity Tolerance Patient tolerated treatment well    Behavior During Therapy [facility] for tasks assessed/performed             Past Medical History:  Diagnosis Date   Anemia    Arthritis    Asthma    Complication of anesthesia    Diverticulitis    DVT (deep venous thrombosis) (HCC)    after knee surgery   Family history of adverse reaction to anesthesia    sister had a hard time waking up   Gastritis    1980's was hospitalized   GERD (gastroesophageal reflux disease)    Heart murmur    History of hiatal hernia    Hypertension    Obesities, morbid (HCC)    PONV (postoperative nausea and vomiting)    Pre-diabetes    Sleep apnea    BiPap    Past Surgical History:  Procedure Laterality Date   BREAST REDUCTION SURGERY     BREAST SURGERY     BUNIONECTOMY  01/13/1991   bilateral   CARDIAC CATHETERIZATION N/A 07/30/2014   Procedure: Left Heart Cath and Coronary Angiography;  Surgeon: Tonny Bollman, MD;  Location: [facility] INVASIVE CV LAB;  Service: Cardiovascular;  Laterality: N/A;   CHOLECYSTECTOMY  01/13/2003   HERNIA REPAIR  01/13/1980   KNEE SURGERY  01/12/2001   Righ knee -- chondal implant   TOTAL KNEE ARTHROPLASTY Left 07/30/2020   Procedure: LEFT TOTAL KNEE ARTHROPLASTY;  Surgeon: [name], MD;  Location: [facility] OR;  Service: Orthopedics;  Laterality: Left;    There were no vitals filed for this visit.     Subjective Assessment - 08/15/20 1057     Subjective Pt is a 60 y/o female who presents to OPPT s/p Lt TKA on 07/30/20.  She has 5 HHPT visits and is here today walking with RW.    Limitations Standing;Walking    Patient Stated Goals impove knee mobility, regain strength and function, walk without a limp    Currently in Pain? Yes    Pain Score 4    up to 7/10; at best 0/10   Pain Location Knee    Pain Orientation Left    Pain Descriptors / Indicators Aching;Tightness    Pain Type Acute pain;Surgical pain    Pain Onset 1 to 4 weeks ago    Pain Frequency Intermittent    Aggravating Factors  walking, exercise    Pain Relieving Factors sitting, rest, ice                [facility] PT Assessment - 08/15/20 1051       Assessment   Medical Diagnosis Z96.659 (ICD-10-CM) - Status post total knee replacement, unspecified laterality    Referring Provider (PT) [name], MD    Onset Date/Surgical Date 07/30/20    Hand Dominance Right  Next MD Visit 4 weeks - not scheduled    Prior Therapy HHPT      Precautions   Precautions None      Restrictions   Weight Bearing Restrictions No      Balance Screen   Has the patient fallen in the past 6 months No    Has the patient had a decrease in activity level because of a fear of falling?  Yes    Is the patient reluctant to leave their home because of a fear of falling?  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)      Cognition   Overall Cognitive Status Within Functional Limits for tasks assessed      Observation/Other  Assessments   Focus on Therapeutic Outcomes (FOTO)  37 (predicted 64)      Posture/Postural Control   Posture/Postural Control Postural limitations    Postural Limitations Rounded Shoulders;Forward head      ROM / Strength   AROM / PROM / Strength AROM;PROM;Strength      AROM   AROM Assessment Site Knee    Right/Left Knee Left    Left Knee Extension -14   seated LAW   Left Knee Flexion 73      PROM   PROM Assessment Site Knee    Right/Left Knee Left    Left Knee Extension 0    Left Knee Flexion 75   guarding     Strength   Overall Strength Comments Lt knee 3-/5      Palpation   Palpation comment swelling noted LLE from calf to proximal thigh      Ambulation/Gait   Gait Pattern Decreased stance time - left;Decreased step length - right;Decreased hip/knee flexion - left    Gait Comments amb mod I with RW                        Objective measurements completed on examination: See above findings.       [facility] Adult PT Treatment/Exercise - 08/15/20 1051       Exercises   Exercises Other Exercises    Other Exercises  see pt instructions - pt performed 3-5 reps of each exercise with mod cues                    PT Education - 08/15/20 1134     Education Details HEP, POC and inital goals of PT    Person(s) Educated Patient;Child(ren)    Methods Explanation;Demonstration;Handout    Comprehension Verbalized understanding;Returned demonstration;Need further instruction              PT Short Term Goals - 08/15/20 1139       PT SHORT TERM GOAL #1   Title Independent with initial HEP    Time 4    Period Weeks    Status New    Target Date 09/12/20      PT SHORT TERM  GOAL #2   Title Rt knee AROM improved 0-90 for improved function    Time 4    Period Weeks    Status New    Target Date 09/12/20               PT Long Term Goals - 08/15/20 1139       PT LONG TERM GOAL #1   Title Independent with final HEP    Time 8     Period Weeks    Status New    Target Date 10/10/20      PT LONG TERM GOAL #2   Title Rt knee AROM improved 0-110 for improved function    Time 8    Period Weeks    Status New    Target Date 10/10/20      PT LONG TERM GOAL #3   Title Amb independently without significant deviations for improved function    Time 8    Period Weeks    Status New    Target Date 10/10/20      PT LONG TERM GOAL #4   Title Report pain < 3/10 for improved function    Time 8    Period Weeks    Status New    Target Date 10/10/20      PT LONG TERM GOAL #5   Title FOTO score improved to 64 for improved function    Time 8    Period Weeks    Status New    Target Date 10/10/20                    Plan - 08/15/20 1124     Clinical Impression Statement Pt is a 60 y/o female who presents to OPPT s/p Lt TKA on 07/30/20.  She demonstrates decreased strength, ROM, increased edema and pain with gait abnormalities affecting functional mobility.  Pt will benefit from PT to address deficits listed.    Personal Factors and Comorbidities Comorbidity 3+    Comorbidities hx DVT, HTN, obseity, sleep apnea    Examination-Activity Limitations Sit;Bend;Squat;Stairs;Stand;Transfers;Locomotion Level    Examination-Participation Restrictions Cleaning;Community Activity;Shop;Driving;Meal Prep;Laundry    Stability/Clinical Decision Making Evolving/Moderate complexity    Clinical Decision Making Moderate    Rehab Potential Good    PT Frequency 2x / week    PT Duration 8 weeks    PT Treatment/Interventions ADLs/Self Care Home Management;Cryotherapy;Electrical Stimulation;Moist Heat;Balance training;Therapeutic exercise;Therapeutic activities;Functional mobility training;Stair training;Gait training;Ultrasound;DME Instruction;Neuromuscular re-education;Patient/family education;Manual techniques;Vasopneumatic Device;Dry needling;Passive range of motion    PT Next Visit Plan review HEP, flexion focus (manual, bike, etc),  gait training with cane    PT Home Exercise Plan Access Code: [email or web address]    Consulted and Agree with Plan of Care Patient             Patient will benefit from skilled therapeutic intervention in order to improve the following deficits and impairments:  Abnormal gait, Obesity, Increased edema, Decreased scar mobility, Decreased strength, Difficulty walking, Decreased mobility, Decreased balance, Pain, Impaired flexibility, Decreased range of motion  Visit Diagnosis: Acute pain of left knee - Plan: PT plan of care cert/re-cert  Stiffness of left knee, not elsewhere classified - Plan: PT plan of care cert/re-cert  Other abnormalities of gait and mobility - Plan: PT plan of care cert/re-cert  Muscle weakness (generalized) - Plan: PT plan of care cert/re-cert  Localized edema - Plan: PT plan of care cert/re-cert     Problem List Patient Active Problem List   Diagnosis  Date Noted   Arthritis of left knee    S/P total knee arthroplasty, left 07/30/2020   Unilateral primary osteoarthritis, left knee 05/07/2016   Abnormal stress ECG with treadmill 07/30/2014   Palpitation 06/22/2014   Dyspnea 06/22/2014       Clarita Crane, PT, DPT 08/15/20 11:42 AM     [facility] Physical Therapy [address] [place], [place], [address] Phone: [phone]   Fax:  [phone]  Name: Bethany Kemp MRN: [id number] Date of Birth: Jul 23, 1960

## 2020-08-18 ENCOUNTER — Other Ambulatory Visit: Payer: Self-pay | Admitting: Surgical

## 2020-08-20 ENCOUNTER — Encounter: Payer: Self-pay | Admitting: Rehabilitative and Restorative Service Providers"

## 2020-08-20 ENCOUNTER — Other Ambulatory Visit: Payer: Self-pay

## 2020-08-20 ENCOUNTER — Ambulatory Visit: Payer: Federal, State, Local not specified - PPO | Admitting: Rehabilitative and Restorative Service Providers"

## 2020-08-20 ENCOUNTER — Ambulatory Visit (INDEPENDENT_AMBULATORY_CARE_PROVIDER_SITE_OTHER): Payer: Federal, State, Local not specified - PPO | Admitting: Rehabilitative and Restorative Service Providers"

## 2020-08-20 DIAGNOSIS — M6281 Muscle weakness (generalized): Secondary | ICD-10-CM | POA: Diagnosis not present

## 2020-08-20 DIAGNOSIS — M25662 Stiffness of left knee, not elsewhere classified: Secondary | ICD-10-CM

## 2020-08-20 DIAGNOSIS — R2689 Other abnormalities of gait and mobility: Secondary | ICD-10-CM

## 2020-08-20 DIAGNOSIS — M25562 Pain in left knee: Secondary | ICD-10-CM | POA: Diagnosis not present

## 2020-08-20 DIAGNOSIS — R600 Localized edema: Secondary | ICD-10-CM

## 2020-08-20 NOTE — Therapy (Signed)
[facility] Physical Therapy [address] [place], [place], [address] Phone: [phone]   Fax:  [phone]  Physical Therapy Treatment  Patient Details  Name: Bethany Kemp MRN: [id number] Date of Birth: Sep 30, 1960 Referring Provider (PT): August Saucer Corrie Mckusick, MD   Encounter Date: 08/20/2020   PT End of Session - 08/20/20 1000     Visit Number 2    Number of Visits 16    Date for PT Re-Evaluation 10/10/20    Progress Note Due on Visit 10    PT Start Time 1002    PT Stop Time 1052    PT Time Calculation (min) 50 min    Activity Tolerance Patient tolerated treatment well    Behavior During Therapy [facility] for tasks assessed/performed             Past Medical History:  Diagnosis Date   Anemia    Arthritis    Asthma    Complication of anesthesia    Diverticulitis    DVT (deep venous thrombosis) (HCC)    after knee surgery   Family history of adverse reaction to anesthesia    sister had a hard time waking up   Gastritis    1980's was hospitalized   GERD (gastroesophageal reflux disease)    Heart murmur    History of hiatal hernia    Hypertension    Obesities, morbid (HCC)    PONV (postoperative nausea and vomiting)    Pre-diabetes    Sleep apnea    BiPap    Past Surgical History:  Procedure Laterality Date   BREAST REDUCTION SURGERY     BREAST SURGERY     BUNIONECTOMY  01/13/1991   bilateral   CARDIAC CATHETERIZATION N/A 07/30/2014   Procedure: Left Heart Cath and Coronary Angiography;  Surgeon: Tonny Bollman, MD;  Location: [facility] INVASIVE CV LAB;  Service: Cardiovascular;  Laterality: N/A;   CHOLECYSTECTOMY  01/13/2003   HERNIA REPAIR  01/13/1980   KNEE SURGERY  01/12/2001   Righ knee -- chondal implant   TOTAL KNEE ARTHROPLASTY Left 07/30/2020   Procedure: LEFT TOTAL KNEE ARTHROPLASTY;  Surgeon: [name], MD;  Location: [facility] OR;  Service: Orthopedics;  Laterality: Left;    There were no vitals filed for this visit.    Subjective Assessment - 08/20/20 1005     Subjective Pt. indicated 4-5/10 today upon arrival.  Pt. indicated feeling tense and tight.    Limitations Standing;Walking    Patient Stated Goals impove knee mobility, regain strength and function, walk without a limp    Currently in Pain? Yes    Pain Score 4     Pain Location Knee    Pain Orientation Left    Pain Descriptors / Indicators Aching;Sore;Tightness    Pain Type Acute pain;Surgical pain    Pain Onset 1 to 4 weeks ago                               [facility] Adult PT Treatment/Exercise - 08/20/20 0001       Exercises   Exercises Knee/Hip      Knee/Hip Exercises: Stretches   Gastroc Stretch 30 seconds;3 reps;Both   incline board   Other Knee/Hip Stretches supine heel prop education and performance to tolerance 1x      Knee/Hip Exercises: Aerobic   Nustep Lvl 5 7 mins to flexion tolerance      Knee/Hip Exercises: Seated   Long Arc [employer]  Left   2 x 10 3 lb c contralateral leg movement opposite c pause in end range flexion/extension   Other Seated Knee/Hip Exercises isometric 5 sec hold flexion/extension alt in mid range x 8 each way      Knee/Hip Exercises: Supine   Quad Sets Left;10 reps   5 sec hold   Other Supine Knee/Hip Exercises active heel slide to SLR 2 x 6 Lt      Modalities   Modalities Vasopneumatic      Vasopneumatic   Number Minutes Vasopneumatic  10 minutes    Vasopnuematic Location  Knee   Lt in heel prop elevation on wedge   Vasopneumatic Pressure Medium    Vasopneumatic Temperature  34      Manual Therapy   Manual therapy comments seated Lt knee flexion mobilization c movement c distraction/IR c contralateral leg movement opposite                      PT Short Term Goals - 08/20/20 1030       PT SHORT TERM GOAL #1   Title Independent with initial HEP    Time 4    Period Weeks    Status On-going    Target Date 09/12/20      PT SHORT TERM GOAL #2   Title Rt knee  AROM improved 0-90 for improved function    Time 4    Period Weeks    Status On-going    Target Date 09/12/20               PT Long Term Goals - 08/15/20 1139       PT LONG TERM GOAL #1   Title Independent with final HEP    Time 8    Period Weeks    Status New    Target Date 10/10/20      PT LONG TERM GOAL #2   Title Rt knee AROM improved 0-110 for improved function    Time 8    Period Weeks    Status New    Target Date 10/10/20      PT LONG TERM GOAL #3   Title Amb independently without significant deviations for improved function    Time 8    Period Weeks    Status New    Target Date 10/10/20      PT LONG TERM GOAL #4   Title Report pain < 3/10 for improved function    Time 8    Period Weeks    Status New    Target Date 10/10/20      PT LONG TERM GOAL #5   Title FOTO score improved to 64 for improved function    Time 8    Period Weeks    Status New    Target Date 10/10/20                   Plan - 08/20/20 1028     Clinical Impression Statement Flexion mobility most limited at this time but also noted extension lag in LAQ/SLR noted for Lt knee as well.  Continued skilled PT services indicated at this time to progress mobilty and strength.  Pt. continued to use FWW at this time.    Personal Factors and Comorbidities Comorbidity 3+    Comorbidities hx DVT, HTN, obseity, sleep apnea    Examination-Activity Limitations Sit;Bend;Squat;Stairs;Stand;Transfers;Locomotion Level    Examination-Participation Restrictions Cleaning;Community Activity;Shop;Driving;Meal Prep;Laundry    Stability/Clinical Decision Making Evolving/Moderate  complexity    Rehab Potential Good    PT Frequency 2x / week    PT Duration 8 weeks    PT Treatment/Interventions ADLs/Self Care Home Management;Cryotherapy;Electrical Stimulation;Moist Heat;Balance training;Therapeutic exercise;Therapeutic activities;Functional mobility training;Stair training;Gait training;Ultrasound;DME  Instruction;Neuromuscular re-education;Patient/family education;Manual techniques;Vasopneumatic Device;Dry needling;Passive range of motion    PT Next Visit Plan Manual for flexion gains c contralateral leg movement opposite(symptom relief), quad activation/strengthening. cane use as appropriate.    PT Home Exercise Plan Access Code: [email or web address]    Consulted and Agree with Plan of Care Patient             Patient will benefit from skilled therapeutic intervention in order to improve the following deficits and impairments:  Abnormal gait, Obesity, Increased edema, Decreased scar mobility, Decreased strength, Difficulty walking, Decreased mobility, Decreased balance, Pain, Impaired flexibility, Decreased range of motion  Visit Diagnosis: Acute pain of left knee  Stiffness of left knee, not elsewhere classified  Other abnormalities of gait and mobility  Muscle weakness (generalized)  Localized edema     Problem List Patient Active Problem List   Diagnosis Date Noted   Arthritis of left knee    S/P total knee arthroplasty, left 07/30/2020   Unilateral primary osteoarthritis, left knee 05/07/2016   Abnormal stress ECG with treadmill 07/30/2014   Palpitation 06/22/2014   Dyspnea 06/22/2014    Chyrel Masson, PT, DPT, OCS, ATC 08/20/20  10:40 AM    [facility] Physical Therapy [address] [place], [place], [address] Phone: [phone]   Fax:  [phone]  Name: AWANDA WILCOCK MRN: [id number] Date of Birth: 1960-02-02

## 2020-08-22 ENCOUNTER — Ambulatory Visit (INDEPENDENT_AMBULATORY_CARE_PROVIDER_SITE_OTHER): Payer: Federal, State, Local not specified - PPO | Admitting: Physical Therapy

## 2020-08-22 ENCOUNTER — Other Ambulatory Visit: Payer: Self-pay

## 2020-08-22 ENCOUNTER — Encounter: Payer: Self-pay | Admitting: Physical Therapy

## 2020-08-22 DIAGNOSIS — M6281 Muscle weakness (generalized): Secondary | ICD-10-CM

## 2020-08-22 DIAGNOSIS — R600 Localized edema: Secondary | ICD-10-CM

## 2020-08-22 DIAGNOSIS — M25562 Pain in left knee: Secondary | ICD-10-CM

## 2020-08-22 DIAGNOSIS — M25662 Stiffness of left knee, not elsewhere classified: Secondary | ICD-10-CM | POA: Diagnosis not present

## 2020-08-22 DIAGNOSIS — R2689 Other abnormalities of gait and mobility: Secondary | ICD-10-CM

## 2020-08-22 NOTE — Therapy (Signed)
[facility] Physical Therapy [address] [place], [place], [address] Phone: [phone]   Fax:  [phone]  Physical Therapy Treatment  Patient Details  Name: Bethany Kemp MRN: [id number] Date of Birth: Jun 17, 1960 Referring Provider (PT): August Saucer Corrie Mckusick, MD   Encounter Date: 08/22/2020   PT End of Session - 08/22/20 1006     Visit Number 3    Number of Visits 16    Date for PT Re-Evaluation 10/10/20    Progress Note Due on Visit 10    PT Start Time 0925    PT Stop Time 1015    PT Time Calculation (min) 50 min    Activity Tolerance Patient tolerated treatment well    Behavior During Therapy [facility] for tasks assessed/performed             Past Medical History:  Diagnosis Date   Anemia    Arthritis    Asthma    Complication of anesthesia    Diverticulitis    DVT (deep venous thrombosis) (HCC)    after knee surgery   Family history of adverse reaction to anesthesia    sister had a hard time waking up   Gastritis    1980's was hospitalized   GERD (gastroesophageal reflux disease)    Heart murmur    History of hiatal hernia    Hypertension    Obesities, morbid (HCC)    PONV (postoperative nausea and vomiting)    Pre-diabetes    Sleep apnea    BiPap    Past Surgical History:  Procedure Laterality Date   BREAST REDUCTION SURGERY     BREAST SURGERY     BUNIONECTOMY  01/13/1991   bilateral   CARDIAC CATHETERIZATION N/A 07/30/2014   Procedure: Left Heart Cath and Coronary Angiography;  Surgeon: Tonny Bollman, MD;  Location: [facility] INVASIVE CV LAB;  Service: Cardiovascular;  Laterality: N/A;   CHOLECYSTECTOMY  01/13/2003   HERNIA REPAIR  01/13/1980   KNEE SURGERY  01/12/2001   Righ knee -- chondal implant   TOTAL KNEE ARTHROPLASTY Left 07/30/2020   Procedure: LEFT TOTAL KNEE ARTHROPLASTY;  Surgeon: [name], MD;  Location: [facility] OR;  Service: Orthopedics;  Laterality: Left;    There were no vitals filed for this visit.    Subjective Assessment - 08/22/20 0931     Subjective knee is still really tight today    Limitations Standing;Walking    Patient Stated Goals impove knee mobility, regain strength and function, walk without a limp    Currently in Pain? Yes    Pain Score 4     Pain Location Knee    Pain Orientation Left    Pain Descriptors / Indicators Aching;Sore;Tightness    Pain Type Acute pain;Surgical pain    Pain Onset 1 to 4 weeks ago    Pain Frequency Intermittent    Aggravating Factors  walking, exercise    Pain Relieving Factors sitting, rest, ice                [facility] PT Assessment - 08/22/20 1006       Assessment   Medical Diagnosis Z96.659 (ICD-10-CM) - Status post total knee replacement, unspecified laterality    Referring Provider (PT) [name], MD    Onset Date/Surgical Date 07/30/20      PROM   Left Knee Flexion 96   sitting with percussive device  [facility] Adult PT Treatment/Exercise - 08/22/20 0932       Knee/Hip Exercises: Stretches   Knee: Self-Stretch Limitations LLE with RLE crossed over Lt 10 x 10 sec hold    Other Knee/Hip Stretches seated tailgate flexion x 2 min      Knee/Hip Exercises: Aerobic   Recumbent Bike Seat 6 Partial revolutions x 8 min      Knee/Hip Exercises: Seated   Long Arc Quad Left;3 sets;10 reps    Sit to [employer] 10 reps;without UE support      Vasopneumatic   Number Minutes Vasopneumatic  10 minutes    Vasopnuematic Location  Knee    Vasopneumatic Pressure Medium    Vasopneumatic Temperature  34      Manual Therapy   Manual therapy comments seated Lt knee flexion mobilization c movement c distraction/IR c contralateral leg movement opposite; also utilizing percussive device to quads to help relax for increased knee flexion                      PT Short Term Goals - 08/20/20 1030       PT SHORT TERM GOAL #1   Title Independent with initial HEP    Time 4    Period Weeks     Status On-going    Target Date 09/12/20      PT SHORT TERM GOAL #2   Title Rt knee AROM improved 0-90 for improved function    Time 4    Period Weeks    Status On-going    Target Date 09/12/20               PT Long Term Goals - 08/15/20 1139       PT LONG TERM GOAL #1   Title Independent with final HEP    Time 8    Period Weeks    Status New    Target Date 10/10/20      PT LONG TERM GOAL #2   Title Rt knee AROM improved 0-110 for improved function    Time 8    Period Weeks    Status New    Target Date 10/10/20      PT LONG TERM GOAL #3   Title Amb independently without significant deviations for improved function    Time 8    Period Weeks    Status New    Target Date 10/10/20      PT LONG TERM GOAL #4   Title Report pain < 3/10 for improved function    Time 8    Period Weeks    Status New    Target Date 10/10/20      PT LONG TERM GOAL #5   Title FOTO score improved to 64 for improved function    Time 8    Period Weeks    Status New    Target Date 10/10/20                   Plan - 08/22/20 1006     Clinical Impression Statement Pt continues to be limited with flexion, mostly due to pain and swelling.  She responds well to percussive device with manual interventions and has percussive device at home.  Will continue to benefit from PT to maximize function.    Personal Factors and Comorbidities Comorbidity 3+    Comorbidities hx DVT, HTN, obseity, sleep apnea    Examination-Activity Limitations Sit;Bend;Squat;Stairs;Stand;Transfers;Locomotion Level    Examination-Participation Restrictions Cleaning;Community Activity;Shop;Driving;Meal  Prep;Laundry    Stability/Clinical Decision Making Evolving/Moderate complexity    Rehab Potential Good    PT Frequency 2x / week    PT Duration 8 weeks    PT Treatment/Interventions ADLs/Self Care Home Management;Cryotherapy;Electrical Stimulation;Moist Heat;Balance training;Therapeutic exercise;Therapeutic  activities;Functional mobility training;Stair training;Gait training;Ultrasound;DME Instruction;Neuromuscular re-education;Patient/family education;Manual techniques;Vasopneumatic Device;Dry needling;Passive range of motion    PT Next Visit Plan Manual for flexion gains c contralateral leg movement opposite(with percussive device), quad activation/strengthening. cane use as appropriate.    PT Home Exercise Plan Access Code: [email or web address]    Consulted and Agree with Plan of Care Patient             Patient will benefit from skilled therapeutic intervention in order to improve the following deficits and impairments:  Abnormal gait, Obesity, Increased edema, Decreased scar mobility, Decreased strength, Difficulty walking, Decreased mobility, Decreased balance, Pain, Impaired flexibility, Decreased range of motion  Visit Diagnosis: Acute pain of left knee  Stiffness of left knee, not elsewhere classified  Other abnormalities of gait and mobility  Muscle weakness (generalized)  Localized edema     Problem List Patient Active Problem List   Diagnosis Date Noted   Arthritis of left knee    S/P total knee arthroplasty, left 07/30/2020   Unilateral primary osteoarthritis, left knee 05/07/2016   Abnormal stress ECG with treadmill 07/30/2014   Palpitation 06/22/2014   Dyspnea 06/22/2014      Clarita Crane, PT, DPT 08/22/20 10:09 AM     [facility] Physical Therapy [address] [place], [place], [address] Phone: [phone]   Fax:  [phone]  Name: LAMYAH CREED MRN: [id number] Date of Birth: 04-13-1960

## 2020-08-27 ENCOUNTER — Other Ambulatory Visit: Payer: Self-pay

## 2020-08-27 ENCOUNTER — Ambulatory Visit (INDEPENDENT_AMBULATORY_CARE_PROVIDER_SITE_OTHER): Payer: Federal, State, Local not specified - PPO | Admitting: Physical Therapy

## 2020-08-27 ENCOUNTER — Encounter: Payer: Self-pay | Admitting: Physical Therapy

## 2020-08-27 DIAGNOSIS — M25662 Stiffness of left knee, not elsewhere classified: Secondary | ICD-10-CM | POA: Diagnosis not present

## 2020-08-27 DIAGNOSIS — M6281 Muscle weakness (generalized): Secondary | ICD-10-CM

## 2020-08-27 DIAGNOSIS — R2689 Other abnormalities of gait and mobility: Secondary | ICD-10-CM

## 2020-08-27 DIAGNOSIS — M25562 Pain in left knee: Secondary | ICD-10-CM | POA: Diagnosis not present

## 2020-08-27 DIAGNOSIS — R600 Localized edema: Secondary | ICD-10-CM

## 2020-08-27 NOTE — Therapy (Signed)
[facility] Physical Therapy [address] [place], [place], [address] Phone: [phone]   Fax:  [phone]  Physical Therapy Treatment  Patient Details  Name: Bethany Kemp MRN: [id number] Date of Birth: [birth year] Referring Provider (PT): August Saucer Corrie Mckusick, MD   Encounter Date: 08/27/2020   PT End of Session - 08/27/20 1106     Visit Number 4    Number of Visits 16    Date for PT Re-Evaluation 10/10/20    Progress Note Due on Visit 10    PT Start Time 1004    PT Stop Time 1048    PT Time Calculation (min) 44 min    Activity Tolerance Patient tolerated treatment well    Behavior During Therapy [facility] for tasks assessed/performed             Past Medical History:  Diagnosis Date   Anemia    Arthritis    Asthma    Complication of anesthesia    Diverticulitis    DVT (deep venous thrombosis) (HCC)    after knee surgery   Family history of adverse reaction to anesthesia    sister had a hard time waking up   Gastritis    1980's was hospitalized   GERD (gastroesophageal reflux disease)    Heart murmur    History of hiatal hernia    Hypertension    Obesities, morbid (HCC)    PONV (postoperative nausea and vomiting)    Pre-diabetes    Sleep apnea    BiPap    Past Surgical History:  Procedure Laterality Date   BREAST REDUCTION SURGERY     BREAST SURGERY     BUNIONECTOMY  01/13/1991   bilateral   CARDIAC CATHETERIZATION N/A 07/30/2014   Procedure: Left Heart Cath and Coronary Angiography;  Surgeon: Tonny Bollman, MD;  Location: [facility] INVASIVE CV LAB;  Service: Cardiovascular;  Laterality: N/A;   CHOLECYSTECTOMY  01/13/2003   HERNIA REPAIR  01/13/1980   KNEE SURGERY  01/12/2001   Righ knee -- chondal implant   TOTAL KNEE ARTHROPLASTY Left 07/30/2020   Procedure: LEFT TOTAL KNEE ARTHROPLASTY;  Surgeon: [name], MD;  Location: [facility] OR;  Service: Orthopedics;  Laterality: Left;    There were no vitals filed for this visit.    Subjective Assessment - 08/27/20 1006     Subjective feels like knee is moving better but did seem to get tighter over the past few days.    Limitations Standing;Walking    Patient Stated Goals impove knee mobility, regain strength and function, walk without a limp    Currently in Pain? Yes    Pain Score 4     Pain Location Knee    Pain Orientation Left    Pain Descriptors / Indicators Aching;Sore;Tightness    Pain Type Acute pain;Surgical pain    Pain Onset 1 to 4 weeks ago    Pain Frequency Intermittent    Aggravating Factors  walking, exercise, bending knee    Pain Relieving Factors sitting, rest, ice                               [facility] Adult PT Treatment/Exercise - 08/27/20 1006       Ambulation/Gait   Gait Comments amb mod I with SPC on level/indoor surfaces      Knee/Hip Exercises: Aerobic   Recumbent Bike Seat 6 Partial revolutions x 8 min      Knee/Hip Exercises: Machines  for Strengthening   Cybex Leg Press 75# 3x10; with 2 min flexion holds between stes      Knee/Hip Exercises: Seated   Long Arc Quad Left;3 sets;10 reps    Long Arc Quad Weight 5 lbs.      Manual Therapy   Manual therapy comments seated Lt knee flexion mobilization c movement c distraction/IR c contralateral leg movement opposite; also utilizing percussive device to quads to help relax for increased knee flexion                      PT Short Term Goals - 08/20/20 1030       PT SHORT TERM GOAL #1   Title Independent with initial HEP    Time 4    Period Weeks    Status On-going    Target Date 09/12/20      PT SHORT TERM GOAL #2   Title Rt knee AROM improved 0-90 for improved function    Time 4    Period Weeks    Status On-going    Target Date 09/12/20               PT Long Term Goals - 08/15/20 1139       PT LONG TERM GOAL #1   Title Independent with final HEP    Time 8    Period Weeks    Status New    Target Date 10/10/20      PT LONG TERM  GOAL #2   Title Rt knee AROM improved 0-110 for improved function    Time 8    Period Weeks    Status New    Target Date 10/10/20      PT LONG TERM GOAL #3   Title Amb independently without significant deviations for improved function    Time 8    Period Weeks    Status New    Target Date 10/10/20      PT LONG TERM GOAL #4   Title Report pain < 3/10 for improved function    Time 8    Period Weeks    Status New    Target Date 10/10/20      PT LONG TERM GOAL #5   Title FOTO score improved to 64 for improved function    Time 8    Period Weeks    Status New    Target Date 10/10/20                   Plan - 08/27/20 1106     Clinical Impression Statement Pt very motived to improve flexion ROM and aggressively working on it at home with improvements noted today in clinic.  Also safe to amb with SPC at home and into clinic/short distances.  Will continue to benefit from PT to maximize function.    Personal Factors and Comorbidities Comorbidity 3+    Comorbidities hx DVT, HTN, obseity, sleep apnea    Examination-Activity Limitations Sit;Bend;Squat;Stairs;Stand;Transfers;Locomotion Level    Examination-Participation Restrictions Cleaning;Community Activity;Shop;Driving;Meal Prep;Laundry    Stability/Clinical Decision Making Evolving/Moderate complexity    Rehab Potential Good    PT Frequency 2x / week    PT Duration 8 weeks    PT Treatment/Interventions ADLs/Self Care Home Management;Cryotherapy;Electrical Stimulation;Moist Heat;Balance training;Therapeutic exercise;Therapeutic activities;Functional mobility training;Stair training;Gait training;Ultrasound;DME Instruction;Neuromuscular re-education;Patient/family education;Manual techniques;Vasopneumatic Device;Dry needling;Passive range of motion    PT Next Visit Plan Manual for flexion gains c contralateral leg movement opposite(with percussive device), quad activation/strengthening. cane  use as appropriate.    PT Home  Exercise Plan Access Code: [email or web address]    Consulted and Agree with Plan of Care Patient             Patient will benefit from skilled therapeutic intervention in order to improve the following deficits and impairments:  Abnormal gait, Obesity, Increased edema, Decreased scar mobility, Decreased strength, Difficulty walking, Decreased mobility, Decreased balance, Pain, Impaired flexibility, Decreased range of motion  Visit Diagnosis: Acute pain of left knee  Stiffness of left knee, not elsewhere classified  Other abnormalities of gait and mobility  Muscle weakness (generalized)  Localized edema     Problem List Patient Active Problem List   Diagnosis Date Noted   Arthritis of left knee    S/P total knee arthroplasty, left 07/30/2020   Unilateral primary osteoarthritis, left knee 05/07/2016   Abnormal stress ECG with treadmill 07/30/2014   Palpitation 06/22/2014   Dyspnea 06/22/2014      Clarita Crane, PT, DPT 08/27/20 11:09 AM     [facility] Physical Therapy [address] [place], [place], [address] Phone: [phone]   Fax:  [phone]  Name: Bethany Kemp MRN: [id number] Date of Birth: [birth year]

## 2020-08-29 ENCOUNTER — Other Ambulatory Visit: Payer: Self-pay

## 2020-08-29 ENCOUNTER — Ambulatory Visit (INDEPENDENT_AMBULATORY_CARE_PROVIDER_SITE_OTHER): Payer: Federal, State, Local not specified - PPO | Admitting: Rehabilitative and Restorative Service Providers"

## 2020-08-29 ENCOUNTER — Encounter: Payer: Self-pay | Admitting: Rehabilitative and Restorative Service Providers"

## 2020-08-29 DIAGNOSIS — R011 Cardiac murmur, unspecified: Secondary | ICD-10-CM | POA: Insufficient documentation

## 2020-08-29 DIAGNOSIS — M25562 Pain in left knee: Secondary | ICD-10-CM | POA: Diagnosis not present

## 2020-08-29 DIAGNOSIS — R600 Localized edema: Secondary | ICD-10-CM

## 2020-08-29 DIAGNOSIS — M25662 Stiffness of left knee, not elsewhere classified: Secondary | ICD-10-CM

## 2020-08-29 DIAGNOSIS — M6281 Muscle weakness (generalized): Secondary | ICD-10-CM

## 2020-08-29 DIAGNOSIS — E663 Overweight: Secondary | ICD-10-CM | POA: Insufficient documentation

## 2020-08-29 DIAGNOSIS — R2689 Other abnormalities of gait and mobility: Secondary | ICD-10-CM | POA: Diagnosis not present

## 2020-08-29 DIAGNOSIS — I10 Essential (primary) hypertension: Secondary | ICD-10-CM | POA: Insufficient documentation

## 2020-08-29 NOTE — Therapy (Signed)
[facility] Physical Therapy [address] [place], [place], [address] Phone: [phone]   Fax:  [phone]  Physical Therapy Treatment  Patient Details  Name: Bethany Kemp MRN: [id number] Date of Birth: 06-27-1960 Referring Provider (PT): [name], MD   Encounter Date: 08/29/2020   PT End of Session - 08/29/20 0953     Visit Number 5    Number of Visits 16    Date for PT Re-Evaluation 10/10/20    Progress Note Due on Visit 10    PT Start Time 0955    PT Stop Time 1040    PT Time Calculation (min) 45 min    Activity Tolerance Patient tolerated treatment well    Behavior During Therapy [facility] for tasks assessed/performed             Past Medical History:  Diagnosis Date   Anemia    Arthritis    Asthma    Complication of anesthesia    Diverticulitis    DVT (deep venous thrombosis) (HCC)    after knee surgery   Family history of adverse reaction to anesthesia    sister had a hard time waking up   Gastritis    1980's was hospitalized   GERD (gastroesophageal reflux disease)    Heart murmur    History of hiatal hernia    Hypertension    Obesities, morbid (HCC)    PONV (postoperative nausea and vomiting)    Pre-diabetes    Sleep apnea    BiPap    Past Surgical History:  Procedure Laterality Date   BREAST REDUCTION SURGERY     BREAST SURGERY     BUNIONECTOMY  01/13/1991   bilateral   CARDIAC CATHETERIZATION N/A 07/30/2014   Procedure: Left Heart Cath and Coronary Angiography;  Surgeon: Tonny Bollman, MD;  Location: [facility] INVASIVE CV LAB;  Service: Cardiovascular;  Laterality: N/A;   CHOLECYSTECTOMY  01/13/2003   HERNIA REPAIR  01/13/1980   KNEE SURGERY  01/12/2001   Righ knee -- chondal implant   TOTAL KNEE ARTHROPLASTY Left 07/30/2020   Procedure: LEFT TOTAL KNEE ARTHROPLASTY;  Surgeon: [name], MD;  Location: [facility] OR;  Service: Orthopedics;  Laterality: Left;    There were no vitals filed for this visit.    Subjective Assessment - 08/29/20 1003     Subjective Pt. indicated continued stiffness in bending primarily, noted in distal quad and across anterior knee joint.    Limitations Standing;Walking    Patient Stated Goals impove knee mobility, regain strength and function, walk without a limp    Currently in Pain? Yes    Pain Score 3     Pain Location Knee    Pain Orientation Left    Pain Descriptors / Indicators Aching;Tightness;Sore    Pain Type Surgical pain    Pain Onset 1 to 4 weeks ago    Aggravating Factors  stiffness, tightness throughout day    Pain Relieving Factors stretching to move it within reason                               [facility] Adult PT Treatment/Exercise - 08/29/20 0001       Neuro Re-ed    Neuro Re-ed Details  tandem stance 1 min x 1 bilateral      Knee/Hip Exercises: Stretches   Gastroc Stretch 3 reps;60 seconds;Both   incline board   Other Knee/Hip Stretches Discussed use of overpressure 10-15  sec stretch throughout day and use of LLLD 4-5 min holds into flexion several times per day for flexion gains.      Knee/Hip Exercises: Aerobic   Recumbent Bike Partial revolutions 9 mins, seat 7      Knee/Hip Exercises: Machines for Strengthening   Cybex Knee Extension eccentric LAQ on Lt 5 lbs 2 x 10      Manual Therapy   Manual therapy comments seated Lt knee flexion mobilization c movement c distraction/IR c contralateral leg movement opposite; also utilizing percussive device to quads to help relax for increased knee flexion. Seated LLLD stretch flexion 4 mins c percussive device (cues for home use)                      PT Short Term Goals - 08/29/20 1040       PT SHORT TERM GOAL #1   Title Independent with initial HEP    Time 4    Period Weeks    Status Achieved    Target Date 09/12/20      PT SHORT TERM GOAL #2   Title Rt knee AROM improved 0-90 for improved function    Time 4    Period Weeks    Status On-going     Target Date 09/12/20               PT Long Term Goals - 08/15/20 1139       PT LONG TERM GOAL #1   Title Independent with final HEP    Time 8    Period Weeks    Status New    Target Date 10/10/20      PT LONG TERM GOAL #2   Title Rt knee AROM improved 0-110 for improved function    Time 8    Period Weeks    Status New    Target Date 10/10/20      PT LONG TERM GOAL #3   Title Amb independently without significant deviations for improved function    Time 8    Period Weeks    Status New    Target Date 10/10/20      PT LONG TERM GOAL #4   Title Report pain < 3/10 for improved function    Time 8    Period Weeks    Status New    Target Date 10/10/20      PT LONG TERM GOAL #5   Title FOTO score improved to 64 for improved function    Time 8    Period Weeks    Status New    Target Date 10/10/20                   Plan - 08/29/20 1032     Clinical Impression Statement Additional time and emphasis in manual intervention and cues for home application of long duration stretching into flexion to improve mobility.  Continued safe use of SPC at this time in clinic.  Pt. to continue to benefit from skilled PT services for strengthening, WB training (stairs) and balance improvements in addition to flexion gains.    Personal Factors and Comorbidities Comorbidity 3+    Comorbidities hx DVT, HTN, obseity, sleep apnea    Examination-Activity Limitations Sit;Bend;Squat;Stairs;Stand;Transfers;Locomotion Level    Examination-Participation Restrictions Cleaning;Community Activity;Shop;Driving;Meal Prep;Laundry    Stability/Clinical Decision Making Evolving/Moderate complexity    Rehab Potential Good    PT Frequency 2x / week    PT Duration 8 weeks  PT Treatment/Interventions ADLs/Self Care Home Management;Cryotherapy;Electrical Stimulation;Moist Heat;Balance training;Therapeutic exercise;Therapeutic activities;Functional mobility training;Stair training;Gait  training;Ultrasound;DME Instruction;Neuromuscular re-education;Patient/family education;Manual techniques;Vasopneumatic Device;Dry needling;Passive range of motion    PT Next Visit Plan Manual for flexion gains c contralateral leg movement opposite(with percussive device), LLLD stretching  quad strengthening, balance as tolerated.    PT Home Exercise Plan Access Code: [email or web address]    Consulted and Agree with Plan of Care Patient             Patient will benefit from skilled therapeutic intervention in order to improve the following deficits and impairments:  Abnormal gait, Obesity, Increased edema, Decreased scar mobility, Decreased strength, Difficulty walking, Decreased mobility, Decreased balance, Pain, Impaired flexibility, Decreased range of motion  Visit Diagnosis: Acute pain of left knee  Stiffness of left knee, not elsewhere classified  Other abnormalities of gait and mobility  Muscle weakness (generalized)  Localized edema     Problem List Patient Active Problem List   Diagnosis Date Noted   Arthritis of left knee    S/P total knee arthroplasty, left 07/30/2020   Unilateral primary osteoarthritis, left knee 05/07/2016   Abnormal stress ECG with treadmill 07/30/2014   Palpitation 06/22/2014   Dyspnea 06/22/2014   Chyrel Masson, PT, DPT, OCS, ATC 08/29/20  10:41 AM    [facility] Physical Therapy [address] [place], [place], [address] Phone: [phone]   Fax:  [phone]  Name: Bethany Kemp MRN: [id number] Date of Birth: 06/26/1960

## 2020-08-29 NOTE — Progress Notes (Signed)
Cardiology Office Note   Date:  08/30/2020   ID:  Bethany Kemp, DOB 1960-02-17, MRN [id number]  PCP:  Pcp, No  Cardiologist:   Rollene Rotunda, MD   Chief Complaint  Patient presents with   Palpitations       History of Present Illness: Bethany Kemp is a 60 y.o. female who who presents for patient presents for evaluation of palpitations.   I saw her in 2020.  She is well.  She just had a total knee replacement.  She is working with physical therapy and using her walker.  She says she is having some rare palpitations but these are less intense now that she is retired from the [employer].  She has not had any presyncope or syncope.  She denies any chest pressure, neck or arm discomfort.  She has not had no new shortness of breath, PND or orthopnea.   Past Medical History:  Diagnosis Date   Anemia    Arthritis    Asthma    Complication of anesthesia    Diverticulitis    DVT (deep venous thrombosis) (HCC)    after knee surgery   Family history of adverse reaction to anesthesia    sister had a hard time waking up   Gastritis    1980's was hospitalized   GERD (gastroesophageal reflux disease)    Heart murmur    History of hiatal hernia    Hypertension    Obesities, morbid (HCC)    PONV (postoperative nausea and vomiting)    Pre-diabetes    Sleep apnea    BiPap    Past Surgical History:  Procedure Laterality Date   BREAST REDUCTION SURGERY     BREAST SURGERY     BUNIONECTOMY  01/13/1991   bilateral   CARDIAC CATHETERIZATION N/A 07/30/2014   Procedure: Left Heart Cath and Coronary Angiography;  Surgeon: Tonny Bollman, MD;  Location: [facility] INVASIVE CV LAB;  Service: Cardiovascular;  Laterality: N/A;   CHOLECYSTECTOMY  01/13/2003   HERNIA REPAIR  01/13/1980   KNEE SURGERY  01/12/2001   Righ knee -- chondal implant   TOTAL KNEE ARTHROPLASTY Left 07/30/2020   Procedure: LEFT TOTAL KNEE ARTHROPLASTY;  Surgeon: [name], MD;  Location: [facility] OR;  Service:  Orthopedics;  Laterality: Left;   Family History  Problem Relation Age of Onset   Asthma Father    Heart attack Mother 68       Died with MI age 24   Arthritis Mother    Diabetes Mother    Stroke Sister    Breast cancer Sister    Asthma Daughter    Social History   Socioeconomic History   Marital status: Single    Spouse name: Not on file   Number of children: 1   Years of education: Not on file   Highest education level: Not on file  Occupational History   Not on file  Tobacco Use   Smoking status: Never   Smokeless tobacco: Never  Vaping Use   Vaping Use: Never used  Substance and Sexual Activity   Alcohol use: No   Drug use: No   Sexual activity: Not on file  Other Topics Concern   Not on file  Social History Narrative   Lives alone.    Social Determinants of Health   Financial Resource Strain: Not on file  Food Insecurity: Not on file  Transportation Needs: Not on file  Physical Activity: Not on file  Stress:  Not on file  Social Connections: Not on file  Intimate Partner Violence: Not on file     Current Outpatient Medications  Medication Sig Dispense Refill   albuterol (PROVENTIL HFA;VENTOLIN HFA) 108 (90 BASE) MCG/ACT inhaler Inhale 2 puffs into the lungs every 4 (four) hours as needed for wheezing or shortness of breath.     amitriptyline (ELAVIL) 25 MG tablet Take 75 mg by mouth at bedtime.     amLODipine (NORVASC) 10 MG tablet Take 1 tablet (10 mg total) by mouth daily. 90 tablet 3   atenolol (TENORMIN) 50 MG tablet Take 50 mg by mouth daily.     cholecalciferol (VITAMIN D) 25 MCG (1000 UNIT) tablet Take 2,000 Units by mouth daily.     diclofenac Sodium (VOLTAREN) 1 % GEL Apply 2 g topically daily as needed (pain).     fluticasone (FLONASE) 50 MCG/ACT nasal spray Place 2 sprays into both nostrils daily as needed for allergies.     gabapentin (NEURONTIN) 400 MG capsule Take 400 mg by mouth at bedtime as needed (pain).     hydrochlorothiazide  (HYDRODIURIL) 25 MG tablet Take 12.5 tablets by mouth daily as needed (fluid).     hydroxypropyl methylcellulose / hypromellose (ISOPTO TEARS / GONIOVISC) 2.5 % ophthalmic solution Place 1 drop into both eyes 3 (three) times daily as needed for dry eyes.     loratadine (CLARITIN) 10 MG tablet Take 10 mg by mouth daily as needed for allergies.     losartan (COZAAR) 50 MG tablet Take 50 mg by mouth daily.     methocarbamol (ROBAXIN) 500 MG tablet Take 1 tablet (500 mg total) by mouth every 8 (eight) hours as needed for muscle spasms. 30 tablet 0   mometasone (ASMANEX) 220 MCG/INH inhaler Inhale 2 puffs into the lungs at bedtime.     omeprazole (PRILOSEC) 20 MG capsule Take 20 mg by mouth 2 (two) times daily. (Patient not taking: Reported on 08/30/2020)     oxyCODONE (OXY IR/ROXICODONE) 5 MG immediate release tablet Take 1-2 tablets (5-10 mg total) by mouth every 4 (four) hours as needed for moderate pain (pain score 4-6). (Patient not taking: Reported on 08/30/2020) 30 tablet 0   predniSONE (DELTASONE) 5 MG tablet Take 5 mg by mouth daily with breakfast. (Patient not taking: Reported on 08/30/2020)     rivaroxaban (XARELTO) 10 MG TABS tablet Take 1 tablet (10 mg total) by mouth at bedtime. (Patient not taking: Reported on 08/30/2020) 21 tablet 0   No current facility-administered medications for this visit.    Allergies:   Ibuprofen, Lisinopril, Acetaminophen, Aspirin, and Spironolactone    ROS:  Please see the history of present illness.   Otherwise, review of systems are positive for none.   All other systems are reviewed and negative.    PHYSICAL EXAM: VS:  BP (!) 142/80   Pulse 80   Ht 5\' 4"  (1.626 m)   Wt 217 lb 6.4 oz (98.6 kg)   LMP 01/13/2015   SpO2 97%   BMI 37.32 kg/m  , BMI Body mass index is 37.32 kg/m. GENERAL:  Well appearing NECK:  No jugular venous distention, waveform within normal limits, carotid upstroke brisk and symmetric, no bruits, no thyromegaly LUNGS:  Clear to  auscultation bilaterally CHEST:  Unremarkable HEART:  PMI not displaced or sustained,S1 and S2 within normal limits, no S3, no S4, no clicks, no rubs, 2 out of 6 apical and right upper sternal border brief holosystolic murmur, no diastolic murmurs ABD:  Flat, positive bowel sounds normal in frequency in pitch, no bruits, no rebound, no guarding, no midline pulsatile mass, no hepatomegaly, no splenomegaly EXT:  2 plus pulses throughout, no edema, no cyanosis no clubbing  EKG:  EKG is  not ordered today. The ekg ordered today demonstrates sinus rhythm, rate 80, axis within normal limits, intervals within normal limits, no acute ST-T wave changes.   Recent Labs: 07/23/2020: BUN 9; Creatinine, Ser 0.60; Hemoglobin 11.7; Platelets 330; Potassium 3.2; Sodium 140    Lipid Panel No results found for: CHOL, TRIG, HDL, CHOLHDL, VLDL, LDLCALC, LDLDIRECT    Wt Readings from Last 3 Encounters:  08/30/20 217 lb 6.4 oz (98.6 kg)  07/30/20 225 lb 4.8 oz (102.2 kg)  07/23/20 225 lb 4.8 oz (102.2 kg)      Other studies Reviewed: Additional studies/ records that were reviewed today include:  None Review of the above records demonstrates: NA   ASSESSMENT AND PLAN:   PALPITATIONS:    These are not particularly problematic.  No change in therapy.   HTN:  The blood pressure is just a little bit elevated today but she did not take her medicines and she says it is typically 130s over 70s at home.   OVERWEIGHT: We have talked about this previously.  As her knee gets better hopefully should be able to be more active along with diet can lose 3 pounds a month.   MURMUR:     She had no significant abnormalities in 2020 on echo.  Her murmur is unchanged and does not increase with the strain phase of Valsalva.  She probably has some aortic sclerosis and I will follow this clinically in about 18 months with another exam.    Current medicines are reviewed at length with the patient today.  The patient does not  have concerns regarding medicines.  The following changes have been made: None  Labs/ tests ordered today include: None  No orders of the defined types were placed in this encounter.    Disposition:   FU with me in 18 months   Signed, Rollene Rotunda, MD  08/30/2020 9:41 AM    [facility]

## 2020-08-30 ENCOUNTER — Ambulatory Visit: Payer: Federal, State, Local not specified - PPO | Admitting: Cardiology

## 2020-08-30 ENCOUNTER — Encounter: Payer: Self-pay | Admitting: Cardiology

## 2020-08-30 VITALS — BP 142/80 | HR 80 | Ht 64.0 in | Wt 217.4 lb

## 2020-08-30 DIAGNOSIS — E663 Overweight: Secondary | ICD-10-CM

## 2020-08-30 DIAGNOSIS — R011 Cardiac murmur, unspecified: Secondary | ICD-10-CM | POA: Diagnosis not present

## 2020-08-30 DIAGNOSIS — I10 Essential (primary) hypertension: Secondary | ICD-10-CM | POA: Diagnosis not present

## 2020-08-30 DIAGNOSIS — R002 Palpitations: Secondary | ICD-10-CM | POA: Diagnosis not present

## 2020-08-30 NOTE — Patient Instructions (Signed)
  Follow-Up: At [facility], you and your health needs are our priority.  As part of our continuing mission to provide you with exceptional heart care, we have created designated Provider Care Teams.  These Care Teams include your primary Cardiologist (physician) and Advanced Practice Providers (APPs -  Physician Assistants and Nurse Practitioners) who all work together to provide you with the care you need, when you need it.  We recommend signing up for the patient portal called "MyChart".  Sign up information is provided on this After Visit Summary.  MyChart is used to connect with patients for Virtual Visits (Telemedicine).  Patients are able to view lab/test results, encounter notes, upcoming appointments, etc.  Non-urgent messages can be sent to your provider as well.   To learn more about what you can do with MyChart, go to [email or web address].    Your next appointment:   18 month(s)  The format for your next appointment:   In Person  Provider:   James Hochrein, MD   

## 2020-09-03 ENCOUNTER — Ambulatory Visit (INDEPENDENT_AMBULATORY_CARE_PROVIDER_SITE_OTHER): Payer: Federal, State, Local not specified - PPO | Admitting: Physical Therapy

## 2020-09-03 ENCOUNTER — Encounter: Payer: Self-pay | Admitting: Physical Therapy

## 2020-09-03 ENCOUNTER — Other Ambulatory Visit: Payer: Self-pay

## 2020-09-03 DIAGNOSIS — M25662 Stiffness of left knee, not elsewhere classified: Secondary | ICD-10-CM

## 2020-09-03 DIAGNOSIS — M25562 Pain in left knee: Secondary | ICD-10-CM | POA: Diagnosis not present

## 2020-09-03 DIAGNOSIS — R2689 Other abnormalities of gait and mobility: Secondary | ICD-10-CM

## 2020-09-03 DIAGNOSIS — M6281 Muscle weakness (generalized): Secondary | ICD-10-CM

## 2020-09-03 DIAGNOSIS — R600 Localized edema: Secondary | ICD-10-CM

## 2020-09-03 NOTE — Therapy (Signed)
[facility] Physical Therapy [address] [place], [place], [address] Phone: [phone]   Fax:  [phone]  Physical Therapy Treatment  Patient Details  Name: Bethany Kemp MRN: [id number] Date of Birth: [birth year] Referring Provider (PT): August Saucer Corrie Mckusick, MD   Encounter Date: 09/03/2020   PT End of Session - 09/03/20 1054     Visit Number 6    Number of Visits 16    Date for PT Re-Evaluation 10/10/20    Progress Note Due on Visit 10    PT Start Time 1015    PT Stop Time 1054    PT Time Calculation (min) 39 min    Activity Tolerance Patient tolerated treatment well    Behavior During Therapy [facility] for tasks assessed/performed             Past Medical History:  Diagnosis Date   Anemia    Arthritis    Asthma    Complication of anesthesia    Diverticulitis    DVT (deep venous thrombosis) (HCC)    after knee surgery   Family history of adverse reaction to anesthesia    sister had a hard time waking up   Gastritis    1980's was hospitalized   GERD (gastroesophageal reflux disease)    Heart murmur    History of hiatal hernia    Hypertension    Obesities, morbid (HCC)    PONV (postoperative nausea and vomiting)    Pre-diabetes    Sleep apnea    BiPap    Past Surgical History:  Procedure Laterality Date   BREAST REDUCTION SURGERY     BREAST SURGERY     BUNIONECTOMY  01/13/1991   bilateral   CARDIAC CATHETERIZATION N/A 07/30/2014   Procedure: Left Heart Cath and Coronary Angiography;  Surgeon: Tonny Bollman, MD;  Location: [facility] INVASIVE CV LAB;  Service: Cardiovascular;  Laterality: N/A;   CHOLECYSTECTOMY  01/13/2003   HERNIA REPAIR  01/13/1980   KNEE SURGERY  01/12/2001   Righ knee -- chondal implant   TOTAL KNEE ARTHROPLASTY Left 07/30/2020   Procedure: LEFT TOTAL KNEE ARTHROPLASTY;  Surgeon: [name], MD;  Location: [facility] OR;  Service: Orthopedics;  Laterality: Left;    There were no vitals filed for this visit.    Subjective Assessment - 09/03/20 1015     Subjective knee is still stiff, and feels weak some but overall it's slowly getting better    Limitations Standing;Walking    Patient Stated Goals impove knee mobility, regain strength and function, walk without a limp    Currently in Pain? No/denies                [facility] PT Assessment - 09/03/20 0001       PROM   Left Knee Flexion 95                           [facility] Adult PT Treatment/Exercise - 09/03/20 1016       Knee/Hip Exercises: Stretches   Knee: Self-Stretch Limitations LLE with RLE crossed over Lt 10 x 10 sec hold      Knee/Hip Exercises: Aerobic   Nustep L7 x 10 min      Knee/Hip Exercises: Machines for Strengthening   Cybex Knee Extension eccentric LAQ on Lt 5 lbs 2 x 10    Cybex Knee Flexion 15# 2x10 min A from RLE      Knee/Hip Exercises: Standing   Forward Step Up  Left;2 sets;10 reps;Hand Hold: 1;Step Height: 6"      Manual Therapy   Manual therapy comments seated Lt knee flexion mobilization c movement c distraction/IR c contralateral leg movement opposite; also utilizing percussive device to quads to help relax for increased knee flexion.                      PT Short Term Goals - 08/29/20 1040       PT SHORT TERM GOAL #1   Title Independent with initial HEP    Time 4    Period Weeks    Status Achieved    Target Date 09/12/20      PT SHORT TERM GOAL #2   Title Rt knee AROM improved 0-90 for improved function    Time 4    Period Weeks    Status On-going    Target Date 09/12/20               PT Long Term Goals - 08/15/20 1139       PT LONG TERM GOAL #1   Title Independent with final HEP    Time 8    Period Weeks    Status New    Target Date 10/10/20      PT LONG TERM GOAL #2   Title Rt knee AROM improved 0-110 for improved function    Time 8    Period Weeks    Status New    Target Date 10/10/20      PT LONG TERM GOAL #3   Title Amb independently  without significant deviations for improved function    Time 8    Period Weeks    Status New    Target Date 10/10/20      PT LONG TERM GOAL #4   Title Report pain < 3/10 for improved function    Time 8    Period Weeks    Status New    Target Date 10/10/20      PT LONG TERM GOAL #5   Title FOTO score improved to 64 for improved function    Time 8    Period Weeks    Status New    Target Date 10/10/20                   Plan - 09/03/20 1054     Clinical Impression Statement Pt tolerated session well today with continued focus on ROM and strengthening.  Challenged pt to get to 100 before MD visit next week and pt will be aggressive at home on ROM.  Will continue to benefit from PT to maximize function.    Personal Factors and Comorbidities Comorbidity 3+    Comorbidities hx DVT, HTN, obseity, sleep apnea    Examination-Activity Limitations Sit;Bend;Squat;Stairs;Stand;Transfers;Locomotion Level    Examination-Participation Restrictions Cleaning;Community Activity;Shop;Driving;Meal Prep;Laundry    Stability/Clinical Decision Making Evolving/Moderate complexity    Rehab Potential Good    PT Frequency 2x / week    PT Duration 8 weeks    PT Treatment/Interventions ADLs/Self Care Home Management;Cryotherapy;Electrical Stimulation;Moist Heat;Balance training;Therapeutic exercise;Therapeutic activities;Functional mobility training;Stair training;Gait training;Ultrasound;DME Instruction;Neuromuscular re-education;Patient/family education;Manual techniques;Vasopneumatic Device;Dry needling;Passive range of motion    PT Next Visit Plan Manual for flexion gains c contralateral leg movement opposite(with percussive device), LLLD stretching  quad strengthening, balance as tolerated. challenged pt to get to 100 deg before MD appt next week    PT Home Exercise Plan Access Code: [email or web address]    Consulted and Agree with  Plan of Care Patient             Patient will benefit from skilled  therapeutic intervention in order to improve the following deficits and impairments:  Abnormal gait, Obesity, Increased edema, Decreased scar mobility, Decreased strength, Difficulty walking, Decreased mobility, Decreased balance, Pain, Impaired flexibility, Decreased range of motion  Visit Diagnosis: Acute pain of left knee  Stiffness of left knee, not elsewhere classified  Other abnormalities of gait and mobility  Muscle weakness (generalized)  Localized edema     Problem List Patient Active Problem List   Diagnosis Date Noted   Overweight 08/29/2020   Essential hypertension 08/29/2020   Murmur 08/29/2020   Arthritis of left knee    S/P total knee arthroplasty, left 07/30/2020   Unilateral primary osteoarthritis, left knee 05/07/2016   Abnormal stress ECG with treadmill 07/30/2014   Palpitation 06/22/2014   Dyspnea 06/22/2014      Clarita Crane, PT, DPT 09/03/20 10:57 AM    [facility] Physical Therapy [address] [place], [place], [address] Phone: [phone]   Fax:  [phone]  Name: Bethany Kemp MRN: [id number] Date of Birth: [birth year]

## 2020-09-05 ENCOUNTER — Encounter: Payer: Self-pay | Admitting: Physical Therapy

## 2020-09-05 ENCOUNTER — Ambulatory Visit (INDEPENDENT_AMBULATORY_CARE_PROVIDER_SITE_OTHER): Payer: Federal, State, Local not specified - PPO | Admitting: Physical Therapy

## 2020-09-05 ENCOUNTER — Other Ambulatory Visit: Payer: Self-pay

## 2020-09-05 DIAGNOSIS — M25662 Stiffness of left knee, not elsewhere classified: Secondary | ICD-10-CM

## 2020-09-05 DIAGNOSIS — M6281 Muscle weakness (generalized): Secondary | ICD-10-CM

## 2020-09-05 DIAGNOSIS — R600 Localized edema: Secondary | ICD-10-CM

## 2020-09-05 DIAGNOSIS — R2689 Other abnormalities of gait and mobility: Secondary | ICD-10-CM

## 2020-09-05 DIAGNOSIS — M25562 Pain in left knee: Secondary | ICD-10-CM

## 2020-09-05 NOTE — Therapy (Signed)
[facility] Physical Therapy [address] [place], [place], [address] Phone: [phone]   Fax:  [phone]  Physical Therapy Treatment  Patient Details  Name: Bethany Kemp MRN: [id number] Date of Birth: 02-Jan-1961 Referring Provider (PT): August Saucer Corrie Mckusick, MD   Encounter Date: 09/05/2020   PT End of Session - 09/05/20 1046     Visit Number 7    Number of Visits 16    Date for PT Re-Evaluation 10/10/20    Progress Note Due on Visit 10    PT Start Time 1003    PT Stop Time 1054    PT Time Calculation (min) 51 min    Activity Tolerance Patient tolerated treatment well    Behavior During Therapy [facility] for tasks assessed/performed             Past Medical History:  Diagnosis Date   Anemia    Arthritis    Asthma    Complication of anesthesia    Diverticulitis    DVT (deep venous thrombosis) (HCC)    after knee surgery   Family history of adverse reaction to anesthesia    sister had a hard time waking up   Gastritis    1980's was hospitalized   GERD (gastroesophageal reflux disease)    Heart murmur    History of hiatal hernia    Hypertension    Obesities, morbid (HCC)    PONV (postoperative nausea and vomiting)    Pre-diabetes    Sleep apnea    BiPap    Past Surgical History:  Procedure Laterality Date   BREAST REDUCTION SURGERY     BREAST SURGERY     BUNIONECTOMY  01/13/1991   bilateral   CARDIAC CATHETERIZATION N/A 07/30/2014   Procedure: Left Heart Cath and Coronary Angiography;  Surgeon: Tonny Bollman, MD;  Location: [facility] INVASIVE CV LAB;  Service: Cardiovascular;  Laterality: N/A;   CHOLECYSTECTOMY  01/13/2003   HERNIA REPAIR  01/13/1980   KNEE SURGERY  01/12/2001   Righ knee -- chondal implant   TOTAL KNEE ARTHROPLASTY Left 07/30/2020   Procedure: LEFT TOTAL KNEE ARTHROPLASTY;  Surgeon: [name], MD;  Location: [facility] OR;  Service: Orthopedics;  Laterality: Left;    There were no vitals filed for this visit.    Subjective Assessment - 09/05/20 1001     Subjective knee is doing well    Limitations Standing;Walking    Patient Stated Goals impove knee mobility, regain strength and function, walk without a limp    Currently in Pain? No/denies                [facility] PT Assessment - 09/05/20 1034       Assessment   Medical Diagnosis Z96.659 (ICD-10-CM) - Status post total knee replacement, unspecified laterality    Referring Provider (PT) August Saucer Corrie Mckusick, MD    Onset Date/Surgical Date 07/30/20      AROM   Left Knee Flexion 93      PROM   Left Knee Extension 0    Left Knee Flexion 103                           [facility] Adult PT Treatment/Exercise - 09/05/20 1004       Knee/Hip Exercises: Aerobic   Recumbent Bike full revolutions 9 mins, seat 7      Knee/Hip Exercises: Machines for Strengthening   Cybex Leg Press 75# 3x10; with 2 min flexion holds between stes  Knee/Hip Exercises: Seated   Long Arc Quad Left;3 sets;10 reps    Long Arc Quad Weight 5 lbs.      Knee/Hip Exercises: Supine   Heel Slides AAROM;Left;20 reps                      PT Short Term Goals - 09/05/20 1047       PT SHORT TERM GOAL #1   Title Independent with initial HEP    Time 4    Period Weeks    Status Achieved    Target Date 09/12/20      PT SHORT TERM GOAL #2   Title Rt knee AROM improved 0-90 for improved function    Time 4    Period Weeks    Status On-going    Target Date 09/12/20               PT Long Term Goals - 08/15/20 1139       PT LONG TERM GOAL #1   Title Independent with final HEP    Time 8    Period Weeks    Status New    Target Date 10/10/20      PT LONG TERM GOAL #2   Title Rt knee AROM improved 0-110 for improved function    Time 8    Period Weeks    Status New    Target Date 10/10/20      PT LONG TERM GOAL #3   Title Amb independently without significant deviations for improved function    Time 8    Period Weeks     Status New    Target Date 10/10/20      PT LONG TERM GOAL #4   Title Report pain < 3/10 for improved function    Time 8    Period Weeks    Status New    Target Date 10/10/20      PT LONG TERM GOAL #5   Title FOTO score improved to 64 for improved function    Time 8    Period Weeks    Status New    Target Date 10/10/20                   Plan - 09/05/20 1047     Clinical Impression Statement Pt able to get 102 deg PROM today and over 90 deg active flexion in Lt knee demonstrating great progress towards ROM.  Still lacking quad strength and ability to actively get to 0 deg and reviewed exercises to work on at home, but that ROM was primary focus at this time.  Will continue to benefit from PT to maximize funciton.    Personal Factors and Comorbidities Comorbidity 3+    Comorbidities hx DVT, HTN, obseity, sleep apnea    Examination-Activity Limitations Sit;Bend;Squat;Stairs;Stand;Transfers;Locomotion Level    Examination-Participation Restrictions Cleaning;Community Activity;Shop;Driving;Meal Prep;Laundry    Stability/Clinical Decision Making Evolving/Moderate complexity    Rehab Potential Good    PT Frequency 2x / week    PT Duration 8 weeks    PT Treatment/Interventions ADLs/Self Care Home Management;Cryotherapy;Electrical Stimulation;Moist Heat;Balance training;Therapeutic exercise;Therapeutic activities;Functional mobility training;Stair training;Gait training;Ultrasound;DME Instruction;Neuromuscular re-education;Patient/family education;Manual techniques;Vasopneumatic Device;Dry needling;Passive range of motion    PT Next Visit Plan contiue with aggressive flexion (pt performing and PT assist), quad strengthening, balance as tolerated. challenged pt to get to active 100 deg before MD appt next week, capture FOTO and needs MD note    PT Home Exercise Plan Access  Code: [email or web address]    Consulted and Agree with Plan of Care Patient             Patient will benefit from  skilled therapeutic intervention in order to improve the following deficits and impairments:  Abnormal gait, Obesity, Increased edema, Decreased scar mobility, Decreased strength, Difficulty walking, Decreased mobility, Decreased balance, Pain, Impaired flexibility, Decreased range of motion  Visit Diagnosis: Acute pain of left knee  Stiffness of left knee, not elsewhere classified  Other abnormalities of gait and mobility  Muscle weakness (generalized)  Localized edema     Problem List Patient Active Problem List   Diagnosis Date Noted   Overweight 08/29/2020   Essential hypertension 08/29/2020   Murmur 08/29/2020   Arthritis of left knee    S/P total knee arthroplasty, left 07/30/2020   Unilateral primary osteoarthritis, left knee 05/07/2016   Abnormal stress ECG with treadmill 07/30/2014   Palpitation 06/22/2014   Dyspnea 06/22/2014     Clarita Crane, PT, DPT 09/05/20 10:55 AM    [facility] Physical Therapy [address] [place], [place], [address] Phone: [phone]   Fax:  [phone]  Name: CLARIBEL SACHS MRN: [id number] Date of Birth: January 11, 1961

## 2020-09-10 ENCOUNTER — Other Ambulatory Visit: Payer: Self-pay

## 2020-09-10 ENCOUNTER — Ambulatory Visit (INDEPENDENT_AMBULATORY_CARE_PROVIDER_SITE_OTHER): Payer: Federal, State, Local not specified - PPO | Admitting: Physical Therapy

## 2020-09-10 ENCOUNTER — Encounter: Payer: Self-pay | Admitting: Physical Therapy

## 2020-09-10 DIAGNOSIS — M25562 Pain in left knee: Secondary | ICD-10-CM | POA: Diagnosis not present

## 2020-09-10 DIAGNOSIS — R600 Localized edema: Secondary | ICD-10-CM

## 2020-09-10 DIAGNOSIS — R2689 Other abnormalities of gait and mobility: Secondary | ICD-10-CM | POA: Diagnosis not present

## 2020-09-10 DIAGNOSIS — M25662 Stiffness of left knee, not elsewhere classified: Secondary | ICD-10-CM

## 2020-09-10 DIAGNOSIS — M6281 Muscle weakness (generalized): Secondary | ICD-10-CM | POA: Diagnosis not present

## 2020-09-10 NOTE — Therapy (Addendum)
[facility] Physical Therapy [address] [place], [place], [address] Phone: [phone]   Fax:  [phone]  Physical Therapy Treatment Progress Note  Patient Details  Name: Bethany Kemp MRN: [id number] Date of Birth: 1960-06-03 Referring Provider (PT): August Saucer Corrie Mckusick, MD Progress Note Reporting Period 08/15/2020 to 09/10/2020   See note below for Objective Data and Assessment of Progress/Goals.      Encounter Date: 09/10/2020   PT End of Session - 09/10/20 1031     Visit Number 8    Number of Visits 16    Date for PT Re-Evaluation 10/10/20    Authorization Type Progress note sent on 09/10/2020 at 8t visit    Progress Note Due on Visit 16    PT Start Time 1015    PT Stop Time 1055    PT Time Calculation (min) 40 min    Activity Tolerance Patient tolerated treatment well    Behavior During Therapy [facility] for tasks assessed/performed             Past Medical History:  Diagnosis Date   Anemia    Arthritis    Asthma    Complication of anesthesia    Diverticulitis    DVT (deep venous thrombosis) (HCC)    after knee surgery   Family history of adverse reaction to anesthesia    sister had a hard time waking up   Gastritis    1980's was hospitalized   GERD (gastroesophageal reflux disease)    Heart murmur    History of hiatal hernia    Hypertension    Obesities, morbid (HCC)    PONV (postoperative nausea and vomiting)    Pre-diabetes    Sleep apnea    BiPap    Past Surgical History:  Procedure Laterality Date   BREAST REDUCTION SURGERY     BREAST SURGERY     BUNIONECTOMY  01/13/1991   bilateral   CARDIAC CATHETERIZATION N/A 07/30/2014   Procedure: Left Heart Cath and Coronary Angiography;  Surgeon: Tonny Bollman, MD;  Location: [facility] INVASIVE CV LAB;  Service: Cardiovascular;  Laterality: N/A;   CHOLECYSTECTOMY  01/13/2003   HERNIA REPAIR  01/13/1980   KNEE SURGERY  01/12/2001   Righ knee -- chondal implant   TOTAL KNEE  ARTHROPLASTY Left 07/30/2020   Procedure: LEFT TOTAL KNEE ARTHROPLASTY;  Surgeon: [name], MD;  Location: [facility] OR;  Service: Orthopedics;  Laterality: Left;    There were no vitals filed for this visit.   Subjective Assessment - 09/10/20 1028     Subjective Pt reporting 3/10 pain upon arrival. Pt stating it's more stiffness.    Limitations Standing;Walking    Patient Stated Goals impove knee mobility, regain strength and function, walk without a limp    Currently in Pain? Yes    Pain Score 3     Pain Location Knee    Pain Orientation Left    Pain Descriptors / Indicators Tightness;Other (Comment)    Pain Type Surgical pain    Pain Onset More than a month ago                [facility] PT Assessment - 09/10/20 0001       Assessment   Medical Diagnosis Z96.659 (ICD-10-CM) - Status post total knee replacement, unspecified laterality    Referring Provider (PT) August Saucer Corrie Mckusick, MD    Onset Date/Surgical Date 07/30/20      Observation/Other Assessments   Focus on Therapeutic Outcomes (FOTO)  63% (predicted 64)  AROM   Left Knee Extension 0    Left Knee Flexion 100      PROM   Left Knee Extension 0    Left Knee Flexion 104                           [facility] Adult PT Treatment/Exercise - 09/10/20 0001       Knee/Hip Exercises: Aerobic   Recumbent Bike seat 7 and progressing to 5,  full revolutions x 8 minutes L3      Knee/Hip Exercises: Machines for Strengthening   Cybex Knee Extension 10# extension with bilateral LE's and Lt LE lowering 2x10    Cybex Knee Flexion 15# 2x10    Cybex Leg Press 75# 3x10; with 2 min flexion holds between sets      Knee/Hip Exercises: Seated   Long Arc Quad Left;3 sets;10 reps    Long Arc Quad Weight 5 lbs.      Knee/Hip Exercises: Supine   Heel Slides AAROM;Left;20 reps      Manual Therapy   Manual therapy comments PROM knee flexion                      PT Short Term Goals - 09/10/20 1045        PT SHORT TERM GOAL #1   Title Independent with initial HEP    Status Achieved      PT SHORT TERM GOAL #2   Baseline AROM 0-100 degrees on 09/10/2020    Status Achieved               PT Long Term Goals - 09/10/20 1045       PT LONG TERM GOAL #1   Title Independent with final HEP    Status On-going      PT LONG TERM GOAL #2   Title Rt knee AROM improved 0-110 for improved function    Baseline 0-100 on 09/10/2020    Status On-going      PT LONG TERM GOAL #3   Title Amb independently without significant deviations for improved function    Status On-going      PT LONG TERM GOAL #4   Title Report pain < 3/10 for improved function    Baseline 3/10    Status On-going      PT LONG TERM GOAL #5   Title FOTO score improved to 64 for improved function    Baseline FOTO 63% on 09/10/2020    Status On-going                   Plan - 09/10/20 1031     Clinical Impression Statement Pt tolerating exercises well. Pt's AROM is 100 degrees today. Passive ROM 104 degrees. Pt progressing with increased flexion during bike riding by lowering seat to 5 this session. Pt still progressing with strength and dynamic balance. Pt instructed to contiue to ice and elevating at home to help with edema. Continue skilled PT to maximize function.    Personal Factors and Comorbidities Comorbidity 3+    Comorbidities hx DVT, HTN, obseity, sleep apnea    Examination-Activity Limitations Sit;Bend;Squat;Stairs;Stand;Transfers;Locomotion Level    Examination-Participation Restrictions Cleaning;Community Activity;Shop;Driving;Meal Prep;Laundry    Stability/Clinical Decision Making Evolving/Moderate complexity    Rehab Potential Good    PT Frequency 2x / week    PT Duration 8 weeks    PT Treatment/Interventions ADLs/Self Care Home Management;Cryotherapy;Electrical Stimulation;Moist Heat;Balance training;Therapeutic exercise;Therapeutic activities;Functional mobility  training;Stair training;Gait  training;Ultrasound;DME Instruction;Neuromuscular re-education;Patient/family education;Manual techniques;Vasopneumatic Device;Dry needling;Passive range of motion    PT Next Visit Plan contiue with aggressive flexion (pt performing and PT assist), quad strengthening, balance as tolerated.    PT Home Exercise Plan Access Code: [email or web address]    Consulted and Agree with Plan of Care Patient             Patient will benefit from skilled therapeutic intervention in order to improve the following deficits and impairments:  Abnormal gait, Obesity, Increased edema, Decreased scar mobility, Decreased strength, Difficulty walking, Decreased mobility, Decreased balance, Pain, Impaired flexibility, Decreased range of motion  Visit Diagnosis: Acute pain of left knee  Stiffness of left knee, not elsewhere classified  Other abnormalities of gait and mobility  Muscle weakness (generalized)  Localized edema     Problem List Patient Active Problem List   Diagnosis Date Noted   Overweight 08/29/2020   Essential hypertension 08/29/2020   Murmur 08/29/2020   Arthritis of left knee    S/P total knee arthroplasty, left 07/30/2020   Unilateral primary osteoarthritis, left knee 05/07/2016   Abnormal stress ECG with treadmill 07/30/2014   Palpitation 06/22/2014   Dyspnea 06/22/2014    Sharmon Leyden, PT, MPT 09/10/2020, 11:01 AM  [facility] Physical Therapy [address] [place], [place], [address] Phone: [phone]   Fax:  [phone]  Name: DAMARY DOLAND MRN: [id number] Date of Birth: [birth year]

## 2020-09-11 ENCOUNTER — Ambulatory Visit (INDEPENDENT_AMBULATORY_CARE_PROVIDER_SITE_OTHER): Payer: Federal, State, Local not specified - PPO | Admitting: Orthopedic Surgery

## 2020-09-11 ENCOUNTER — Encounter: Payer: Self-pay | Admitting: Orthopedic Surgery

## 2020-09-11 DIAGNOSIS — Z96652 Presence of left artificial knee joint: Secondary | ICD-10-CM

## 2020-09-12 ENCOUNTER — Encounter: Payer: Self-pay | Admitting: Orthopedic Surgery

## 2020-09-12 ENCOUNTER — Encounter: Payer: Self-pay | Admitting: Rehabilitative and Restorative Service Providers"

## 2020-09-12 ENCOUNTER — Other Ambulatory Visit: Payer: Self-pay

## 2020-09-12 ENCOUNTER — Ambulatory Visit (INDEPENDENT_AMBULATORY_CARE_PROVIDER_SITE_OTHER): Payer: Federal, State, Local not specified - PPO | Admitting: Rehabilitative and Restorative Service Providers"

## 2020-09-12 DIAGNOSIS — M25562 Pain in left knee: Secondary | ICD-10-CM

## 2020-09-12 DIAGNOSIS — M6281 Muscle weakness (generalized): Secondary | ICD-10-CM | POA: Diagnosis not present

## 2020-09-12 DIAGNOSIS — R2689 Other abnormalities of gait and mobility: Secondary | ICD-10-CM | POA: Diagnosis not present

## 2020-09-12 DIAGNOSIS — M25662 Stiffness of left knee, not elsewhere classified: Secondary | ICD-10-CM | POA: Diagnosis not present

## 2020-09-12 DIAGNOSIS — R600 Localized edema: Secondary | ICD-10-CM

## 2020-09-12 NOTE — Therapy (Signed)
[facility] Physical Therapy [address] [place], [place], [address] Phone: [phone]   Fax:  [phone]  Physical Therapy Treatment  Patient Details  Name: Bethany Kemp MRN: [id number] Date of Birth: 04-10-1960 Referring Provider (PT): [name], MD   Encounter Date: 09/12/2020   PT End of Session - 09/12/20 0919     Visit Number 9    Number of Visits 16    Date for PT Re-Evaluation 10/10/20    Authorization Type BCBS $25 copay    Progress Note Due on Visit 16    PT Start Time 0920    PT Stop Time 1002    PT Time Calculation (min) 42 min    Activity Tolerance Patient tolerated treatment well    Behavior During Therapy [facility] for tasks assessed/performed             Past Medical History:  Diagnosis Date   Anemia    Arthritis    Asthma    Complication of anesthesia    Diverticulitis    DVT (deep venous thrombosis) (HCC)    after knee surgery   Family history of adverse reaction to anesthesia    sister had a hard time waking up   Gastritis    1980's was hospitalized   GERD (gastroesophageal reflux disease)    Heart murmur    History of hiatal hernia    Hypertension    Obesities, morbid (HCC)    PONV (postoperative nausea and vomiting)    Pre-diabetes    Sleep apnea    BiPap    Past Surgical History:  Procedure Laterality Date   BREAST REDUCTION SURGERY     BREAST SURGERY     BUNIONECTOMY  01/13/1991   bilateral   CARDIAC CATHETERIZATION N/A 07/30/2014   Procedure: Left Heart Cath and Coronary Angiography;  Surgeon: Tonny Bollman, MD;  Location: [facility] INVASIVE CV LAB;  Service: Cardiovascular;  Laterality: N/A;   CHOLECYSTECTOMY  01/13/2003   HERNIA REPAIR  01/13/1980   KNEE SURGERY  01/12/2001   Righ knee -- chondal implant   TOTAL KNEE ARTHROPLASTY Left 07/30/2020   Procedure: LEFT TOTAL KNEE ARTHROPLASTY;  Surgeon: [name], MD;  Location: [facility] OR;  Service: Orthopedics;  Laterality: Left;    There were no  vitals filed for this visit.   Subjective Assessment - 09/12/20 0922     Subjective Pt. indicated no pain at rest, some pain in first bending.  Pt. indicated MD visit went well.    Limitations Standing;Walking    Patient Stated Goals impove knee mobility, regain strength and function, walk without a limp    Currently in Pain? No/denies    Pain Score 0-No pain    Pain Onset More than a month ago                               [facility] Adult PT Treatment/Exercise - 09/12/20 0001       Neuro Re-ed    Neuro Re-ed Details  tandem ambulation fwd/rev on foam in bars 8 ft x 5 each way, tandem stance on foam 1 min x 1 bilateral      Knee/Hip Exercises: Aerobic   Recumbent Bike seat 6 lvl 3 10 mins      Knee/Hip Exercises: Machines for Strengthening   Cybex Knee Extension eccentric Lt lowering 10 lbs 3 x 10      Knee/Hip Exercises: Standing   Lateral Step  Up Left;Step Height: 4";Hand Hold: 2;2 sets;10 reps      Knee/Hip Exercises: Seated   Other Seated Knee/Hip Exercises seated SLR Lt 2 x 10    Sit to Sand without UE support;15 reps                      PT Short Term Goals - 09/10/20 1045       PT SHORT TERM GOAL #1   Title Independent with initial HEP    Status Achieved      PT SHORT TERM GOAL #2   Baseline AROM 0-100 degrees on 09/10/2020    Status Achieved               PT Long Term Goals - 09/10/20 1045       PT LONG TERM GOAL #1   Title Independent with final HEP    Status On-going      PT LONG TERM GOAL #2   Title Rt knee AROM improved 0-110 for improved function    Baseline 0-100 on 09/10/2020    Status On-going      PT LONG TERM GOAL #3   Title Amb independently without significant deviations for improved function    Status On-going      PT LONG TERM GOAL #4   Title Report pain < 3/10 for improved function    Baseline 3/10    Status On-going      PT LONG TERM GOAL #5   Title FOTO score improved to 64 for improved  function    Baseline FOTO 63% on 09/10/2020    Status On-going                   Plan - 09/12/20 0951     Clinical Impression Statement Pt. lacking control for eccentric step down (fwd and lateral) at this time despite reduced step height.  Improving strength and control will improve functional stair navigation and overall ambulation control.  Continued skilled PT services indicated at this time.    Personal Factors and Comorbidities Comorbidity 3+    Comorbidities hx DVT, HTN, obseity, sleep apnea    Examination-Activity Limitations Sit;Bend;Squat;Stairs;Stand;Transfers;Locomotion Level    Examination-Participation Restrictions Cleaning;Community Activity;Shop;Driving;Meal Prep;Laundry    Stability/Clinical Decision Making Evolving/Moderate complexity    Rehab Potential Good    PT Frequency 2x / week    PT Duration 8 weeks    PT Treatment/Interventions ADLs/Self Care Home Management;Cryotherapy;Electrical Stimulation;Moist Heat;Balance training;Therapeutic exercise;Therapeutic activities;Functional mobility training;Stair training;Gait training;Ultrasound;DME Instruction;Neuromuscular re-education;Patient/family education;Manual techniques;Vasopneumatic Device;Dry needling;Passive range of motion    PT Next Visit Plan Quad strengthening, WB step improvements, static and dynamic balance.    PT Home Exercise Plan Access Code: [email or web address]    Consulted and Agree with Plan of Care Patient             Patient will benefit from skilled therapeutic intervention in order to improve the following deficits and impairments:  Abnormal gait, Obesity, Increased edema, Decreased scar mobility, Decreased strength, Difficulty walking, Decreased mobility, Decreased balance, Pain, Impaired flexibility, Decreased range of motion  Visit Diagnosis: Acute pain of left knee  Stiffness of left knee, not elsewhere classified  Other abnormalities of gait and mobility  Muscle weakness  (generalized)  Localized edema     Problem List Patient Active Problem List   Diagnosis Date Noted   Overweight 08/29/2020   Essential hypertension 08/29/2020   Murmur 08/29/2020   Arthritis of left knee    S/P total knee arthroplasty,  left 07/30/2020   Unilateral primary osteoarthritis, left knee 05/07/2016   Abnormal stress ECG with treadmill 07/30/2014   Palpitation 06/22/2014   Dyspnea 06/22/2014    Chyrel Masson, PT, DPT, OCS, ATC 09/12/20  10:02 AM    [facility] Physical Therapy [address] [place], [place], [address] Phone: [phone]   Fax:  [phone]  Name: Bethany Kemp MRN: [id number] Date of Birth: 1960/10/09

## 2020-09-12 NOTE — Progress Notes (Signed)
Post-Op Visit Note   Patient: Bethany Kemp           Date of Birth: [birth year]           MRN: [id number] Visit Date: 09/11/2020 PCP: Pcp, No   Assessment & Plan:  Chief Complaint:  Chief Complaint  Patient presents with   Left Knee - Routine Post Op   Visit Diagnoses: No diagnosis found.  Plan: Bethany Kemp is a 60 y.o. female who presents s/p left total knee arthroplasty on 07/30/2020.  Patient is doing well and pain is overall controlled.  They are compliant with taking aspirin for DVT prophylaxis.  Denies any chest pain, SOB, calf pain.  Not taking any opioid medication at this time.  Only taking meloxicam as needed.  On exam, incision is healing well without any evidence of infection or dehiscence.  Range of motion of the operative knee from 0 to 95 degrees.  She reaches 100 degrees when she measures her self after some manipulation and 104 degrees in physical therapy..  No calf tenderness.  Negative Homans' sign.  Able to perform straight leg raise with 10 to 15 degree extensor lag.  Plan is continue physical therapy and continue working specifically on quad strength as well as flexion.  Her passive extension is excellent.  Suspect that her active extension will improve as her quadricep strength improves.  Her quad is currently firing.  Follow-up in 6 weeks for final check with Dr. August Saucer.  Follow-Up Instructions: No follow-ups on file.   Orders:  No orders of the defined types were placed in this encounter.  No orders of the defined types were placed in this encounter.   Imaging: No results found.  PMFS History: Patient Active Problem List   Diagnosis Date Noted   Overweight 08/29/2020   Essential hypertension 08/29/2020   Murmur 08/29/2020   Arthritis of left knee    S/P total knee arthroplasty, left 07/30/2020   Unilateral primary osteoarthritis, left knee 05/07/2016   Abnormal stress ECG with treadmill 07/30/2014   Palpitation 06/22/2014   Dyspnea 06/22/2014    Past Medical History:  Diagnosis Date   Anemia    Arthritis    Asthma    Complication of anesthesia    Diverticulitis    DVT (deep venous thrombosis) (HCC)    after knee surgery   Family history of adverse reaction to anesthesia    sister had a hard time waking up   Gastritis    1980's was hospitalized   GERD (gastroesophageal reflux disease)    Heart murmur    History of hiatal hernia    Hypertension    Obesities, morbid (HCC)    PONV (postoperative nausea and vomiting)    Pre-diabetes    Sleep apnea    BiPap    Family History  Problem Relation Age of Onset   Asthma Father    Heart attack Mother 77       Died with MI age 28   Arthritis Mother    Diabetes Mother    Stroke Sister    Breast cancer Sister    Asthma Daughter     Past Surgical History:  Procedure Laterality Date   BREAST REDUCTION SURGERY     BREAST SURGERY     BUNIONECTOMY  01/13/1991   bilateral   CARDIAC CATHETERIZATION N/A 07/30/2014   Procedure: Left Heart Cath and Coronary Angiography;  Surgeon: Tonny Bollman, MD;  Location: [facility] INVASIVE CV LAB;  Service: Cardiovascular;  Laterality: N/A;   CHOLECYSTECTOMY  01/13/2003   HERNIA REPAIR  01/13/1980   KNEE SURGERY  01/12/2001   Righ knee -- chondal implant   TOTAL KNEE ARTHROPLASTY Left 07/30/2020   Procedure: LEFT TOTAL KNEE ARTHROPLASTY;  Surgeon: [name], MD;  Location: [facility] OR;  Service: Orthopedics;  Laterality: Left;   Social History   Occupational History   Not on file  Tobacco Use   Smoking status: Never   Smokeless tobacco: Never  Vaping Use   Vaping Use: Never used  Substance and Sexual Activity   Alcohol use: No   Drug use: No   Sexual activity: Not on file

## 2020-09-17 ENCOUNTER — Ambulatory Visit (INDEPENDENT_AMBULATORY_CARE_PROVIDER_SITE_OTHER): Payer: Federal, State, Local not specified - PPO | Admitting: Physical Therapy

## 2020-09-17 ENCOUNTER — Encounter: Payer: Self-pay | Admitting: Physical Therapy

## 2020-09-17 ENCOUNTER — Other Ambulatory Visit: Payer: Self-pay

## 2020-09-17 DIAGNOSIS — R2689 Other abnormalities of gait and mobility: Secondary | ICD-10-CM

## 2020-09-17 DIAGNOSIS — R600 Localized edema: Secondary | ICD-10-CM

## 2020-09-17 DIAGNOSIS — M25562 Pain in left knee: Secondary | ICD-10-CM

## 2020-09-17 DIAGNOSIS — M25662 Stiffness of left knee, not elsewhere classified: Secondary | ICD-10-CM | POA: Diagnosis not present

## 2020-09-17 DIAGNOSIS — M6281 Muscle weakness (generalized): Secondary | ICD-10-CM

## 2020-09-17 NOTE — Therapy (Signed)
[facility] Physical Therapy [address] [place], [place], [address] Phone: [phone]   Fax:  [phone]  Physical Therapy Treatment  Patient Details  Name: Bethany Kemp MRN: [id number] Date of Birth: 1960/10/21 Referring Provider (PT): August Saucer Corrie Mckusick, MD   Encounter Date: 09/17/2020   PT End of Session - 09/17/20 1050     Visit Number 10    Number of Visits 16    Date for PT Re-Evaluation 10/10/20    Authorization Type [employer] $25 copay    Progress Note Due on Visit 18    PT Start Time 1001    PT Stop Time 1042    PT Time Calculation (min) 41 min    Activity Tolerance Patient tolerated treatment well    Behavior During Therapy WFL for tasks assessed/performed             Past Medical History:  Diagnosis Date   Anemia    Arthritis    Asthma    Complication of anesthesia    Diverticulitis    DVT (deep venous thrombosis) (HCC)    after knee surgery   Family history of adverse reaction to anesthesia    sister had a hard time waking up   Gastritis    1980's was hospitalized   GERD (gastroesophageal reflux disease)    Heart murmur    History of hiatal hernia    Hypertension    Obesities, morbid (HCC)    PONV (postoperative nausea and vomiting)    Pre-diabetes    Sleep apnea    BiPap    Past Surgical History:  Procedure Laterality Date   BREAST REDUCTION SURGERY     BREAST SURGERY     BUNIONECTOMY  01/13/1991   bilateral   CARDIAC CATHETERIZATION N/A 07/30/2014   Procedure: Left Heart Cath and Coronary Angiography;  Surgeon: Tonny Bollman, MD;  Location: [facility] INVASIVE CV LAB;  Service: Cardiovascular;  Laterality: N/A;   CHOLECYSTECTOMY  01/13/2003   HERNIA REPAIR  01/13/1980   KNEE SURGERY  01/12/2001   Righ knee -- chondal implant   TOTAL KNEE ARTHROPLASTY Left 07/30/2020   Procedure: LEFT TOTAL KNEE ARTHROPLASTY;  Surgeon: [name], MD;  Location: [facility] OR;  Service: Orthopedics;  Laterality: Left;    There were no  vitals filed for this visit.   Subjective Assessment - 09/17/20 1004     Subjective Rt knee is giving her more trouble today, still having some buckling on Lt knee.    Limitations Standing;Walking    Patient Stated Goals impove knee mobility, regain strength and function, walk without a limp    Currently in Pain? No/denies    Pain Onset More than a month ago                               [facility] Adult PT Treatment/Exercise - 09/17/20 1004       Self-Care   Self-Care Other Self-Care Comments    Other Self-Care Comments  instructed in Rt gastroc stretching and self mobilization with tennis ball to help with new onset of Rt knee pain      Knee/Hip Exercises: Stretches   Knee: Self-Stretch Limitations LLE with RLE crossed over Lt 10 x 10 sec hold      Knee/Hip Exercises: Aerobic   Recumbent Bike seat 7,6 L3 x 8 min      Knee/Hip Exercises: Machines for Strengthening   Cybex Knee Extension eccentric Lt lowering  10 lbs 3 x 10    Cybex Knee Flexion 15# LLE only 3x10      Knee/Hip Exercises: Seated   Other Seated Knee/Hip Exercises seated SLR 3x10;      Manual Therapy   Manual therapy comments PROM knee flexion                      PT Short Term Goals - 09/10/20 1045       PT SHORT TERM GOAL #1   Title Independent with initial HEP    Status Achieved      PT SHORT TERM GOAL #2   Baseline AROM 0-100 degrees on 09/10/2020    Status Achieved               PT Long Term Goals - 09/10/20 1045       PT LONG TERM GOAL #1   Title Independent with final HEP    Status On-going      PT LONG TERM GOAL #2   Title Rt knee AROM improved 0-110 for improved function    Baseline 0-100 on 09/10/2020    Status On-going      PT LONG TERM GOAL #3   Title Amb independently without significant deviations for improved function    Status On-going      PT LONG TERM GOAL #4   Title Report pain < 3/10 for improved function    Baseline 3/10    Status  On-going      PT LONG TERM GOAL #5   Title FOTO score improved to 64 for improved function    Baseline FOTO 63% on 09/10/2020    Status On-going                   Plan - 09/17/20 1050     Clinical Impression Statement Pt demonstrating improved quad activation with decreased extension lag noted with seated SLR.  Will continue to benefit from PT to maximize function.    Personal Factors and Comorbidities Comorbidity 3+    Comorbidities hx DVT, HTN, obseity, sleep apnea    Examination-Activity Limitations Sit;Bend;Squat;Stairs;Stand;Transfers;Locomotion Level    Examination-Participation Restrictions Cleaning;Community Activity;Shop;Driving;Meal Prep;Laundry    Stability/Clinical Decision Making Evolving/Moderate complexity    Rehab Potential Good    PT Frequency 2x / week    PT Duration 8 weeks    PT Treatment/Interventions ADLs/Self Care Home Management;Cryotherapy;Electrical Stimulation;Moist Heat;Balance training;Therapeutic exercise;Therapeutic activities;Functional mobility training;Stair training;Gait training;Ultrasound;DME Instruction;Neuromuscular re-education;Patient/family education;Manual techniques;Vasopneumatic Device;Dry needling;Passive range of motion    PT Next Visit Plan Quad strengthening, WB step improvements, static and dynamic balance, flexion improvments    PT Home Exercise Plan Access Code: [email or web address]    Consulted and Agree with Plan of Care Patient             Patient will benefit from skilled therapeutic intervention in order to improve the following deficits and impairments:  Abnormal gait, Obesity, Increased edema, Decreased scar mobility, Decreased strength, Difficulty walking, Decreased mobility, Decreased balance, Pain, Impaired flexibility, Decreased range of motion  Visit Diagnosis: Acute pain of left knee  Stiffness of left knee, not elsewhere classified  Other abnormalities of gait and mobility  Muscle weakness (generalized)  Localized  edema     Problem List Patient Active Problem List   Diagnosis Date Noted   Overweight 08/29/2020   Essential hypertension 08/29/2020   Murmur 08/29/2020   Arthritis of left knee    S/P total knee arthroplasty, left 07/30/2020   Unilateral primary  osteoarthritis, left knee 05/07/2016   Abnormal stress ECG with treadmill 07/30/2014   Palpitation 06/22/2014   Dyspnea 06/22/2014      Clarita Crane, PT, DPT 09/17/20 10:52 AM     [facility] Physical Therapy [address] [place], [place], [address] Phone: [phone]   Fax:  [phone]  Name: Bethany Kemp MRN: [id number] Date of Birth: 1960/05/17

## 2020-09-19 ENCOUNTER — Encounter: Payer: Self-pay | Admitting: Physical Therapy

## 2020-09-19 ENCOUNTER — Ambulatory Visit: Payer: Federal, State, Local not specified - PPO | Admitting: Physical Therapy

## 2020-09-19 ENCOUNTER — Other Ambulatory Visit: Payer: Self-pay

## 2020-09-19 DIAGNOSIS — M25562 Pain in left knee: Secondary | ICD-10-CM

## 2020-09-19 DIAGNOSIS — M6281 Muscle weakness (generalized): Secondary | ICD-10-CM

## 2020-09-19 DIAGNOSIS — M25662 Stiffness of left knee, not elsewhere classified: Secondary | ICD-10-CM | POA: Diagnosis not present

## 2020-09-19 DIAGNOSIS — R2689 Other abnormalities of gait and mobility: Secondary | ICD-10-CM

## 2020-09-19 DIAGNOSIS — R600 Localized edema: Secondary | ICD-10-CM

## 2020-09-19 NOTE — Therapy (Signed)
[facility] Physical Therapy [address] [place], [place], [address] Phone: [phone]   Fax:  [phone]  Physical Therapy Treatment  Patient Details  Name: Bethany Kemp MRN: [id number] Date of Birth: June 26, 1960 Referring Provider (PT): August Saucer Corrie Mckusick, MD   Encounter Date: 09/19/2020   PT End of Session - 09/19/20 1109     Visit Number 11    Number of Visits 16    Date for PT Re-Evaluation 10/10/20    Authorization Type BCBS $25 copay    Progress Note Due on Visit 18    PT Start Time 1012    PT Stop Time 1055    PT Time Calculation (min) 43 min    Activity Tolerance Patient tolerated treatment well    Behavior During Therapy WFL for tasks assessed/performed             Past Medical History:  Diagnosis Date   Anemia    Arthritis    Asthma    Complication of anesthesia    Diverticulitis    DVT (deep venous thrombosis) (HCC)    after knee surgery   Family history of adverse reaction to anesthesia    sister had a hard time waking up   Gastritis    1980's was hospitalized   GERD (gastroesophageal reflux disease)    Heart murmur    History of hiatal hernia    Hypertension    Obesities, morbid (HCC)    PONV (postoperative nausea and vomiting)    Pre-diabetes    Sleep apnea    BiPap    Past Surgical History:  Procedure Laterality Date   BREAST REDUCTION SURGERY     BREAST SURGERY     BUNIONECTOMY  01/13/1991   bilateral   CARDIAC CATHETERIZATION N/A 07/30/2014   Procedure: Left Heart Cath and Coronary Angiography;  Surgeon: Tonny Bollman, MD;  Location: [facility] INVASIVE CV LAB;  Service: Cardiovascular;  Laterality: N/A;   CHOLECYSTECTOMY  01/13/2003   HERNIA REPAIR  01/13/1980   KNEE SURGERY  01/12/2001   Righ knee -- chondal implant   TOTAL KNEE ARTHROPLASTY Left 07/30/2020   Procedure: LEFT TOTAL KNEE ARTHROPLASTY;  Surgeon: [name], MD;  Location: [facility] OR;  Service: Orthopedics;  Laterality: Left;    There were no  vitals filed for this visit.   Subjective Assessment - 09/19/20 1015     Subjective doing well, both knees continue to be stiff    Limitations Standing;Walking    Patient Stated Goals impove knee mobility, regain strength and function, walk without a limp    Currently in Pain? No/denies    Pain Onset More than a month ago    Pain Frequency Intermittent    Aggravating Factors  stiffness, tightness throughout the day                [facility] PT Assessment - 09/19/20 1041       Assessment   Medical Diagnosis Z96.659 (ICD-10-CM) - Status post total knee replacement, unspecified laterality    Referring Provider (PT) [name], MD      AROM   Left Knee Extension -5   seated LAQ   Left Knee Flexion 95      PROM   Left Knee Extension 0    Left Knee Flexion 106                           [facility] Adult PT Treatment/Exercise - 09/19/20 1016  Knee/Hip Exercises: Stretches   Knee: Self-Stretch Limitations LLE with RLE crossed over Lt 10 x 10 sec hold      Knee/Hip Exercises: Aerobic   Recumbent Bike seat 6 L3 x 8 min      Knee/Hip Exercises: Machines for Strengthening   Cybex Knee Extension eccentric Lt lowering 10 lbs 3 x 10    Cybex Knee Flexion 15# LLE only 3x10      Knee/Hip Exercises: Seated   Other Seated Knee/Hip Exercises seated SLR on Lt x 5 reps with improved extensor lag      Knee/Hip Exercises: Supine   Straight Leg Raises Left;5 reps    Straight Leg Raises Limitations improved extensor lag today                       PT Short Term Goals - 09/10/20 1045       PT SHORT TERM GOAL #1   Title Independent with initial HEP    Status Achieved      PT SHORT TERM GOAL #2   Baseline AROM 0-100 degrees on 09/10/2020    Status Achieved               PT Long Term Goals - 09/10/20 1045       PT LONG TERM GOAL #1   Title Independent with final HEP    Status On-going      PT LONG TERM GOAL #2   Title Rt knee AROM  improved 0-110 for improved function    Baseline 0-100 on 09/10/2020    Status On-going      PT LONG TERM GOAL #3   Title Amb independently without significant deviations for improved function    Status On-going      PT LONG TERM GOAL #4   Title Report pain < 3/10 for improved function    Baseline 3/10    Status On-going      PT LONG TERM GOAL #5   Title FOTO score improved to 64 for improved function    Baseline FOTO 63% on 09/10/2020    Status On-going                   Plan - 09/19/20 1110     Clinical Impression Statement Pt tolerated session well today with continued focus on strengthening, and especially TKE quad activation.  Improved with repetition today.  Will benefit from PT to maximize function.    Personal Factors and Comorbidities Comorbidity 3+    Comorbidities hx DVT, HTN, obseity, sleep apnea    Examination-Activity Limitations Sit;Bend;Squat;Stairs;Stand;Transfers;Locomotion Level    Examination-Participation Restrictions Cleaning;Community Activity;Shop;Driving;Meal Prep;Laundry    Stability/Clinical Decision Making Evolving/Moderate complexity    Rehab Potential Good    PT Frequency 2x / week    PT Duration 8 weeks    PT Treatment/Interventions ADLs/Self Care Home Management;Cryotherapy;Electrical Stimulation;Moist Heat;Balance training;Therapeutic exercise;Therapeutic activities;Functional mobility training;Stair training;Gait training;Ultrasound;DME Instruction;Neuromuscular re-education;Patient/family education;Manual techniques;Vasopneumatic Device;Dry needling;Passive range of motion    PT Next Visit Plan Quad strengthening (TKE), WB step improvements, static and dynamic balance, flexion improvments    PT Home Exercise Plan Access Code: [email or web address]    Consulted and Agree with Plan of Care Patient             Patient will benefit from skilled therapeutic intervention in order to improve the following deficits and impairments:  Abnormal gait,  Obesity, Increased edema, Decreased scar mobility, Decreased strength, Difficulty walking, Decreased mobility, Decreased balance, Pain, Impaired flexibility,  Decreased range of motion  Visit Diagnosis: Acute pain of left knee  Stiffness of left knee, not elsewhere classified  Other abnormalities of gait and mobility  Muscle weakness (generalized)  Localized edema     Problem List Patient Active Problem List   Diagnosis Date Noted   Overweight 08/29/2020   Essential hypertension 08/29/2020   Murmur 08/29/2020   Arthritis of left knee    S/P total knee arthroplasty, left 07/30/2020   Unilateral primary osteoarthritis, left knee 05/07/2016   Abnormal stress ECG with treadmill 07/30/2014   Palpitation 06/22/2014   Dyspnea 06/22/2014      Clarita Crane, PT, DPT 09/19/20 11:13 AM     [facility] Physical Therapy [address] [place], [place], [address] Phone: [phone]   Fax:  [phone]  Name: Bethany Kemp MRN: [id number] Date of Birth: [birth year]

## 2020-09-24 ENCOUNTER — Encounter: Payer: Self-pay | Admitting: Physical Therapy

## 2020-09-24 ENCOUNTER — Ambulatory Visit (INDEPENDENT_AMBULATORY_CARE_PROVIDER_SITE_OTHER): Payer: Federal, State, Local not specified - PPO | Admitting: Physical Therapy

## 2020-09-24 ENCOUNTER — Other Ambulatory Visit: Payer: Self-pay

## 2020-09-24 DIAGNOSIS — R600 Localized edema: Secondary | ICD-10-CM

## 2020-09-24 DIAGNOSIS — R2689 Other abnormalities of gait and mobility: Secondary | ICD-10-CM | POA: Diagnosis not present

## 2020-09-24 DIAGNOSIS — M25662 Stiffness of left knee, not elsewhere classified: Secondary | ICD-10-CM | POA: Diagnosis not present

## 2020-09-24 DIAGNOSIS — M25562 Pain in left knee: Secondary | ICD-10-CM | POA: Diagnosis not present

## 2020-09-24 DIAGNOSIS — M6281 Muscle weakness (generalized): Secondary | ICD-10-CM

## 2020-09-24 NOTE — Therapy (Signed)
[facility] Physical Therapy [address] [place], [place], [address] Phone: [phone]   Fax:  [phone]  Physical Therapy Treatment  Patient Details  Name: Bethany Kemp MRN: [id number] Date of Birth: 12/02/1960 Referring Provider (PT): August Saucer Corrie Mckusick, MD   Encounter Date: 09/24/2020   PT End of Session - 09/24/20 1053     Visit Number 12    Number of Visits 16    Date for PT Re-Evaluation 10/10/20    Authorization Type BCBS $25 copay    Progress Note Due on Visit 18    PT Start Time 1010    PT Stop Time 1050    PT Time Calculation (min) 40 min    Activity Tolerance Patient tolerated treatment well    Behavior During Therapy WFL for tasks assessed/performed             Past Medical History:  Diagnosis Date   Anemia    Arthritis    Asthma    Complication of anesthesia    Diverticulitis    DVT (deep venous thrombosis) (HCC)    after knee surgery   Family history of adverse reaction to anesthesia    sister had a hard time waking up   Gastritis    1980's was hospitalized   GERD (gastroesophageal reflux disease)    Heart murmur    History of hiatal hernia    Hypertension    Obesities, morbid (HCC)    PONV (postoperative nausea and vomiting)    Pre-diabetes    Sleep apnea    BiPap    Past Surgical History:  Procedure Laterality Date   BREAST REDUCTION SURGERY     BREAST SURGERY     BUNIONECTOMY  01/13/1991   bilateral   CARDIAC CATHETERIZATION N/A 07/30/2014   Procedure: Left Heart Cath and Coronary Angiography;  Surgeon: Tonny Bollman, MD;  Location: [facility] INVASIVE CV LAB;  Service: Cardiovascular;  Laterality: N/A;   CHOLECYSTECTOMY  01/13/2003   HERNIA REPAIR  01/13/1980   KNEE SURGERY  01/12/2001   Righ knee -- chondal implant   TOTAL KNEE ARTHROPLASTY Left 07/30/2020   Procedure: LEFT TOTAL KNEE ARTHROPLASTY;  Surgeon: [name], MD;  Location: [facility] OR;  Service: Orthopedics;  Laterality: Left;    There were  no vitals filed for this visit.   Subjective Assessment - 09/24/20 1004     Subjective Rt calf is feeling better, Lt knee still doing well    Limitations Standing;Walking    Patient Stated Goals impove knee mobility, regain strength and function, walk without a limp    Currently in Pain? No/denies    Pain Onset --                               [facility] Adult PT Treatment/Exercise - 09/24/20 1005       Knee/Hip Exercises: Stretches   Lobbyist 3 reps;30 seconds;Right;Left    Lobbyist Limitations prone with strap    Theme park manager Both;3 reps;30 seconds   slant board     Knee/Hip Exercises: Aerobic   Recumbent Bike seat 6 L3 x 8 min      Knee/Hip Exercises: Machines for Strengthening   Cybex Leg Press 100# 3x10; with 2 min flexion holds between sets; LLE 43# 3x10      Knee/Hip Exercises: Seated   Long Arc Quad Left;3 sets;10 reps    Long Arc Quad Weight 5 lbs.  Long [employer] Limitations 5 sec hold      Knee/Hip Exercises: Prone   Other Prone Exercises prone quad sets x 20 reps bil      Manual Therapy   Manual therapy comments PROM knee flexion in sitting                       PT Short Term Goals - 09/10/20 1045       PT SHORT TERM GOAL #1   Title Independent with initial HEP    Status Achieved      PT SHORT TERM GOAL #2   Baseline AROM 0-100 degrees on 09/10/2020    Status Achieved               PT Long Term Goals - 09/10/20 1045       PT LONG TERM GOAL #1   Title Independent with final HEP    Status On-going      PT LONG TERM GOAL #2   Title Rt knee AROM improved 0-110 for improved function    Baseline 0-100 on 09/10/2020    Status On-going      PT LONG TERM GOAL #3   Title Amb independently without significant deviations for improved function    Status On-going      PT LONG TERM GOAL #4   Title Report pain < 3/10 for improved function    Baseline 3/10    Status On-going      PT LONG TERM GOAL #5    Title FOTO score improved to 64 for improved function    Baseline FOTO 63% on 09/10/2020    Status On-going                   Plan - 09/24/20 1054     Clinical Impression Statement Pt continues to demonstrate improvement in functional strength and continue to work on flexion ROM at this time.  Will continue to benefit from PT to maximize function.    Personal Factors and Comorbidities Comorbidity 3+    Comorbidities hx DVT, HTN, obseity, sleep apnea    Examination-Activity Limitations Sit;Bend;Squat;Stairs;Stand;Transfers;Locomotion Level    Examination-Participation Restrictions Cleaning;Community Activity;Shop;Driving;Meal Prep;Laundry    Stability/Clinical Decision Making Evolving/Moderate complexity    Rehab Potential Good    PT Frequency 2x / week    PT Duration 8 weeks    PT Treatment/Interventions ADLs/Self Care Home Management;Cryotherapy;Electrical Stimulation;Moist Heat;Balance training;Therapeutic exercise;Therapeutic activities;Functional mobility training;Stair training;Gait training;Ultrasound;DME Instruction;Neuromuscular re-education;Patient/family education;Manual techniques;Vasopneumatic Device;Dry needling;Passive range of motion    PT Next Visit Plan Quad strengthening (TKE), static and dynamic balance, flexion improvments    PT Home Exercise Plan Access Code: [email or web address]    Consulted and Agree with Plan of Care Patient             Patient will benefit from skilled therapeutic intervention in order to improve the following deficits and impairments:  Abnormal gait, Obesity, Increased edema, Decreased scar mobility, Decreased strength, Difficulty walking, Decreased mobility, Decreased balance, Pain, Impaired flexibility, Decreased range of motion  Visit Diagnosis: Acute pain of left knee  Stiffness of left knee, not elsewhere classified  Other abnormalities of gait and mobility  Muscle weakness (generalized)  Localized edema     Problem List Patient  Active Problem List   Diagnosis Date Noted   Overweight 08/29/2020   Essential hypertension 08/29/2020   Murmur 08/29/2020   Arthritis of left knee    S/P total knee arthroplasty, left 07/30/2020   Unilateral  primary osteoarthritis, left knee 05/07/2016   Abnormal stress ECG with treadmill 07/30/2014   Palpitation 06/22/2014   Dyspnea 06/22/2014     Clarita Crane, PT, DPT 09/24/20 10:56 AM     [facility] Physical Therapy [address] [place], [place], [address] Phone: [phone]   Fax:  [phone]  Name: WEDNESDAY ERICSSON MRN: [id number] Date of Birth: 1960/05/21

## 2020-09-26 ENCOUNTER — Other Ambulatory Visit: Payer: Self-pay

## 2020-09-26 ENCOUNTER — Ambulatory Visit (INDEPENDENT_AMBULATORY_CARE_PROVIDER_SITE_OTHER): Payer: Federal, State, Local not specified - PPO | Admitting: Rehabilitative and Restorative Service Providers"

## 2020-09-26 DIAGNOSIS — M6281 Muscle weakness (generalized): Secondary | ICD-10-CM | POA: Diagnosis not present

## 2020-09-26 DIAGNOSIS — M25662 Stiffness of left knee, not elsewhere classified: Secondary | ICD-10-CM

## 2020-09-26 DIAGNOSIS — R600 Localized edema: Secondary | ICD-10-CM

## 2020-09-26 DIAGNOSIS — R2689 Other abnormalities of gait and mobility: Secondary | ICD-10-CM | POA: Diagnosis not present

## 2020-09-26 DIAGNOSIS — M25562 Pain in left knee: Secondary | ICD-10-CM | POA: Diagnosis not present

## 2020-09-26 NOTE — Therapy (Signed)
[facility] Physical Therapy [address] [place], [place], [address] Phone: [phone]   Fax:  [phone]  Physical Therapy Treatment  Patient Details  Name: Bethany Kemp MRN: [id number] Date of Birth: October 22, 1960 Referring Provider (PT): [name], MD   Encounter Date: 09/26/2020   PT End of Session - 09/26/20 0927     Visit Number 13    Number of Visits 16    Date for PT Re-Evaluation 10/10/20    Authorization Type [employer]BS $25 copay    Progress Note Due on Visit 18    PT Start Time 0927    PT Stop Time 1007    PT Time Calculation (min) 40 min    Activity Tolerance Patient tolerated treatment well    Behavior During Therapy [facility] for tasks assessed/performed             Past Medical History:  Diagnosis Date   Anemia    Arthritis    Asthma    Complication of anesthesia    Diverticulitis    DVT (deep venous thrombosis) (HCC)    after knee surgery   Family history of adverse reaction to anesthesia    sister had a hard time waking up   Gastritis    1980's was hospitalized   GERD (gastroesophageal reflux disease)    Heart murmur    History of hiatal hernia    Hypertension    Obesities, morbid (HCC)    PONV (postoperative nausea and vomiting)    Pre-diabetes    Sleep apnea    BiPap    Past Surgical History:  Procedure Laterality Date   BREAST REDUCTION SURGERY     BREAST SURGERY     BUNIONECTOMY  01/13/1991   bilateral   CARDIAC CATHETERIZATION N/A 07/30/2014   Procedure: Left Heart Cath and Coronary Angiography;  Surgeon: Tonny Bollman, MD;  Location: [facility] INVASIVE CV LAB;  Service: Cardiovascular;  Laterality: N/A;   CHOLECYSTECTOMY  01/13/2003   HERNIA REPAIR  01/13/1980   KNEE SURGERY  01/12/2001   Righ knee -- chondal implant   TOTAL KNEE ARTHROPLASTY Left 07/30/2020   Procedure: LEFT TOTAL KNEE ARTHROPLASTY;  Surgeon: [name], MD;  Location: [facility] OR;  Service: Orthopedics;  Laterality: Left;    There were  no vitals filed for this visit.       [facility] PT Assessment - 09/26/20 0001       Assessment   Medical Diagnosis Z96.659 (ICD-10-CM) - Status post total knee replacement, unspecified laterality    Referring Provider (PT) August Saucer Corrie Mckusick, MD    Onset Date/Surgical Date 07/30/20    Hand Dominance Right      AROM   Left Knee Extension -3   in LAQ   Left Knee Flexion 105   in heel slide     Strength   Strength Assessment Site Knee    Right/Left Knee Left;Right    Right Knee Extension 5/5   54, 58 lbs   Left Knee Flexion 5/5    Left Knee Extension 5/5   51, 54 lbs                          [facility] Adult PT Treatment/Exercise - 09/26/20 0001       Knee/Hip Exercises: Stretches   Lobbyist Other (comment)   vebal review for home   Gastroc Stretch 30 seconds;3 reps;Both   bilateral     Knee/Hip Exercises: Aerobic   Recumbent  Bike seat 6 lvl 3 6 mins      Knee/Hip Exercises: Machines for Strengthening   Cybex Leg Press 100 lbs 2 x 15 double leg, SL 2 x 10 c 2 min hold after sets in flexion 43 lbs      Knee/Hip Exercises: Standing   Lateral Step Up Step Height: 6";2 sets;10 reps;Left;Hand Hold: 1   eccentric step down control focus.  occasoinal hand assist   Forward Step Up Step Height: 8";Left;2 sets;10 reps      Knee/Hip Exercises: Prone   Other Prone Exercises verbal review of prone set for home      Manual Therapy   Manual therapy comments seated Lt knee flexion mobilization c movement IR/distraction, contract/relax holds for flexion                       PT Short Term Goals - 09/10/20 1045       PT SHORT TERM GOAL #1   Title Independent with initial HEP    Status Achieved      PT SHORT TERM GOAL #2   Baseline AROM 0-100 degrees on 09/10/2020    Status Achieved               PT Long Term Goals - 09/26/20 0943       PT LONG TERM GOAL #1   Title Independent with final HEP    Time 8    Status On-going    Target Date  10/10/20      PT LONG TERM GOAL #2   Title Rt knee AROM improved 0-110 for improved function    Status On-going    Target Date 10/10/20      PT LONG TERM GOAL #3   Title Amb independently without significant deviations for improved function    Time 8    Status On-going    Target Date 10/10/20      PT LONG TERM GOAL #4   Title Report pain < 3/10 for improved function    Time 8    Status On-going    Target Date 10/10/20      PT LONG TERM GOAL #5   Title FOTO score improved to 64 for improved function    Baseline FOTO 63% on 09/10/2020    Time 8    Status On-going    Target Date 10/10/20                   Plan - 09/26/20 0942     Clinical Impression Statement Reassessment of mobility and strength showed continued progress overall.  Pt. does lack flexion actively of Lt knee to reach normal goal ranges.  Intermittent periods of independent ambulation vs. SPC use.    Personal Factors and Comorbidities Comorbidity 3+    Comorbidities hx DVT, HTN, obseity, sleep apnea    Examination-Activity Limitations Sit;Bend;Squat;Stairs;Stand;Transfers;Locomotion Level    Examination-Participation Restrictions Cleaning;Community Activity;Shop;Driving;Meal Prep;Laundry    Stability/Clinical Decision Making Evolving/Moderate complexity    Rehab Potential Good    PT Frequency 2x / week    PT Duration 8 weeks    PT Treatment/Interventions ADLs/Self Care Home Management;Cryotherapy;Electrical Stimulation;Moist Heat;Balance training;Therapeutic exercise;Therapeutic activities;Functional mobility training;Stair training;Gait training;Ultrasound;DME Instruction;Neuromuscular re-education;Patient/family education;Manual techniques;Vasopneumatic Device;Dry needling;Passive range of motion    PT Next Visit Plan Dynamic and compliant surface balance, continued functional strengthening (stairs)    PT Home Exercise Plan Access Code: [email or web address]    Consulted and Agree with Plan of Care Patient  Patient will benefit from skilled therapeutic intervention in order to improve the following deficits and impairments:  Abnormal gait, Obesity, Increased edema, Decreased scar mobility, Decreased strength, Difficulty walking, Decreased mobility, Decreased balance, Pain, Impaired flexibility, Decreased range of motion  Visit Diagnosis: Acute pain of left knee  Stiffness of left knee, not elsewhere classified  Other abnormalities of gait and mobility  Muscle weakness (generalized)  Localized edema     Problem List Patient Active Problem List   Diagnosis Date Noted   Overweight 08/29/2020   Essential hypertension 08/29/2020   Murmur 08/29/2020   Arthritis of left knee    S/P total knee arthroplasty, left 07/30/2020   Unilateral primary osteoarthritis, left knee 05/07/2016   Abnormal stress ECG with treadmill 07/30/2014   Palpitation 06/22/2014   Dyspnea 06/22/2014    Chyrel Masson, PT, DPT, OCS, ATC 09/26/20  10:03 AM    [facility] Physical Therapy [address] [place] [address] [place], [place], [address] Phone: [phone]   Fax:  [phone]  Name: Bethany Kemp MRN: [id number] Date of Birth: 1960/03/04

## 2020-10-01 ENCOUNTER — Other Ambulatory Visit: Payer: Self-pay

## 2020-10-01 ENCOUNTER — Ambulatory Visit (INDEPENDENT_AMBULATORY_CARE_PROVIDER_SITE_OTHER): Payer: Federal, State, Local not specified - PPO | Admitting: Physical Therapy

## 2020-10-01 ENCOUNTER — Encounter: Payer: Self-pay | Admitting: Physical Therapy

## 2020-10-01 DIAGNOSIS — M6281 Muscle weakness (generalized): Secondary | ICD-10-CM

## 2020-10-01 DIAGNOSIS — M25562 Pain in left knee: Secondary | ICD-10-CM

## 2020-10-01 DIAGNOSIS — R2689 Other abnormalities of gait and mobility: Secondary | ICD-10-CM | POA: Diagnosis not present

## 2020-10-01 DIAGNOSIS — M25662 Stiffness of left knee, not elsewhere classified: Secondary | ICD-10-CM | POA: Diagnosis not present

## 2020-10-01 DIAGNOSIS — R600 Localized edema: Secondary | ICD-10-CM

## 2020-10-01 NOTE — Therapy (Signed)
[facility] Physical Therapy [address] [place], [place], [address] Phone: [phone]   Fax:  [phone]  Physical Therapy Treatment  Patient Details  Name: Bethany Kemp MRN: [id number] Date of Birth: Mar 26, 1960 Referring Provider (PT): August Saucer Corrie Mckusick, MD   Encounter Date: 10/01/2020   PT End of Session - 10/01/20 1050     Visit Number 14    Number of Visits 16    Date for PT Re-Evaluation 10/10/20    Authorization Type [employer]BS $25 copay    Progress Note Due on Visit 18    PT Start Time 1014    PT Stop Time 1053    PT Time Calculation (min) 39 min    Activity Tolerance Patient tolerated treatment well    Behavior During Therapy WFL for tasks assessed/performed             Past Medical History:  Diagnosis Date   Anemia    Arthritis    Asthma    Complication of anesthesia    Diverticulitis    DVT (deep venous thrombosis) (HCC)    after knee surgery   Family history of adverse reaction to anesthesia    sister had a hard time waking up   Gastritis    1980's was hospitalized   GERD (gastroesophageal reflux disease)    Heart murmur    History of hiatal hernia    Hypertension    Obesities, morbid (HCC)    PONV (postoperative nausea and vomiting)    Pre-diabetes    Sleep apnea    BiPap    Past Surgical History:  Procedure Laterality Date   BREAST REDUCTION SURGERY     BREAST SURGERY     BUNIONECTOMY  01/13/1991   bilateral   CARDIAC CATHETERIZATION N/A 07/30/2014   Procedure: Left Heart Cath and Coronary Angiography;  Surgeon: Tonny Bollman, MD;  Location: [facility] INVASIVE CV LAB;  Service: Cardiovascular;  Laterality: N/A;   CHOLECYSTECTOMY  01/13/2003   HERNIA REPAIR  01/13/1980   KNEE SURGERY  01/12/2001   Righ knee -- chondal implant   TOTAL KNEE ARTHROPLASTY Left 07/30/2020   Procedure: LEFT TOTAL KNEE ARTHROPLASTY;  Surgeon: [name], MD;  Location: [facility] OR;  Service: Orthopedics;  Laterality: Left;    There were  no vitals filed for this visit.   Subjective Assessment - 10/01/20 1014     Subjective knee is still sore, doing quad stretch before sleeping.    Limitations Standing;Walking    Patient Stated Goals impove knee mobility, regain strength and function, walk without a limp    Currently in Pain? No/denies                               [facility] Adult PT Treatment/Exercise - 10/01/20 1016       Knee/Hip Exercises: Stretches   Gastroc Stretch 30 seconds;3 reps;Both   bilateral     Knee/Hip Exercises: Aerobic   Recumbent Bike L4 x 8 min; seat 6      Knee/Hip Exercises: Machines for Strengthening   Cybex Knee Extension eccentric Lt lowering 10 lbs 3 x 10    Cybex Knee Flexion 15# LLE only 3x10      Knee/Hip Exercises: Standing   Functional Squat 2 sets;10 reps    Functional Squat Limitations TRX    SLS on foam 5x10 sec bil with intermittent UE support    Other Standing Knee Exercises step up onto foam x  10 reps bil; no UE support    Other Standing Knee Exercises RDL 10# 3x10                       PT Short Term Goals - 09/10/20 1045       PT SHORT TERM GOAL #1   Title Independent with initial HEP    Status Achieved      PT SHORT TERM GOAL #2   Baseline AROM 0-100 degrees on 09/10/2020    Status Achieved               PT Long Term Goals - 09/26/20 0943       PT LONG TERM GOAL #1   Title Independent with final HEP    Time 8    Status On-going    Target Date 10/10/20      PT LONG TERM GOAL #2   Title Rt knee AROM improved 0-110 for improved function    Status On-going    Target Date 10/10/20      PT LONG TERM GOAL #3   Title Amb independently without significant deviations for improved function    Time 8    Status On-going    Target Date 10/10/20      PT LONG TERM GOAL #4   Title Report pain < 3/10 for improved function    Time 8    Status On-going    Target Date 10/10/20      PT LONG TERM GOAL #5   Title FOTO score  improved to 64 for improved function    Baseline FOTO 63% on 09/10/2020    Time 8    Status On-going    Target Date 10/10/20                   Plan - 10/01/20 1050     Clinical Impression Statement Pt tolerated strengthening session well expected muscle fatigue noted.  She's progressing well with PT and feels she will be ready to transition to home/gym exercise after next week.    Personal Factors and Comorbidities Comorbidity 3+    Comorbidities hx DVT, HTN, obseity, sleep apnea    Examination-Activity Limitations Sit;Bend;Squat;Stairs;Stand;Transfers;Locomotion Level    Examination-Participation Restrictions Cleaning;Community Activity;Shop;Driving;Meal Prep;Laundry    Stability/Clinical Decision Making Evolving/Moderate complexity    Rehab Potential Good    PT Frequency 2x / week    PT Duration 8 weeks    PT Treatment/Interventions ADLs/Self Care Home Management;Cryotherapy;Electrical Stimulation;Moist Heat;Balance training;Therapeutic exercise;Therapeutic activities;Functional mobility training;Stair training;Gait training;Ultrasound;DME Instruction;Neuromuscular re-education;Patient/family education;Manual techniques;Vasopneumatic Device;Dry needling;Passive range of motion    PT Next Visit Plan Dynamic and compliant surface balance, continued functional strengthening (stairs); begin d/c planning    PT Home Exercise Plan Access Code: [email or web address]    Consulted and Agree with Plan of Care Patient             Patient will benefit from skilled therapeutic intervention in order to improve the following deficits and impairments:  Abnormal gait, Obesity, Increased edema, Decreased scar mobility, Decreased strength, Difficulty walking, Decreased mobility, Decreased balance, Pain, Impaired flexibility, Decreased range of motion  Visit Diagnosis: Acute pain of left knee  Stiffness of left knee, not elsewhere classified  Other abnormalities of gait and mobility  Muscle weakness  (generalized)  Localized edema     Problem List Patient Active Problem List   Diagnosis Date Noted   Overweight 08/29/2020   Essential hypertension 08/29/2020   Murmur 08/29/2020   Arthritis of  left knee    S/P total knee arthroplasty, left 07/30/2020   Unilateral primary osteoarthritis, left knee 05/07/2016   Abnormal stress ECG with treadmill 07/30/2014   Palpitation 06/22/2014   Dyspnea 06/22/2014      Bethany Kemp, PT, DPT 10/01/20 10:53 AM    [facility] Physical Therapy [address] [place], [place], [address] Phone: [phone]   Fax:  [phone]  Name: TAKOYA JONAS MRN: [id number] Date of Birth: 1960-11-26

## 2020-10-03 ENCOUNTER — Other Ambulatory Visit: Payer: Self-pay

## 2020-10-03 ENCOUNTER — Encounter: Payer: Self-pay | Admitting: Physical Therapy

## 2020-10-03 ENCOUNTER — Ambulatory Visit (INDEPENDENT_AMBULATORY_CARE_PROVIDER_SITE_OTHER): Payer: Federal, State, Local not specified - PPO | Admitting: Physical Therapy

## 2020-10-03 DIAGNOSIS — R2689 Other abnormalities of gait and mobility: Secondary | ICD-10-CM

## 2020-10-03 DIAGNOSIS — R600 Localized edema: Secondary | ICD-10-CM

## 2020-10-03 DIAGNOSIS — M6281 Muscle weakness (generalized): Secondary | ICD-10-CM | POA: Diagnosis not present

## 2020-10-03 DIAGNOSIS — M25562 Pain in left knee: Secondary | ICD-10-CM

## 2020-10-03 DIAGNOSIS — M25662 Stiffness of left knee, not elsewhere classified: Secondary | ICD-10-CM

## 2020-10-03 NOTE — Therapy (Signed)
[facility] Physical Therapy [address] [place], [place], [address] Phone: [phone]   Fax:  [phone]  Physical Therapy Treatment  Patient Details  Name: Bethany Kemp MRN: [id number] Date of Birth: 09-Jun-1960 Referring Provider (PT): August Saucer Corrie Mckusick, MD   Encounter Date: 10/03/2020   PT End of Session - 10/03/20 1056     Visit Number 15    Number of Visits 17    Date for PT Re-Evaluation 10/10/20    Authorization Type BCBS $25 copay    Progress Note Due on Visit 18    PT Start Time 1013    PT Stop Time 1102    PT Time Calculation (min) 49 min    Activity Tolerance Patient tolerated treatment well    Behavior During Therapy WFL for tasks assessed/performed             Past Medical History:  Diagnosis Date   Anemia    Arthritis    Asthma    Complication of anesthesia    Diverticulitis    DVT (deep venous thrombosis) (HCC)    after knee surgery   Family history of adverse reaction to anesthesia    sister had a hard time waking up   Gastritis    1980's was hospitalized   GERD (gastroesophageal reflux disease)    Heart murmur    History of hiatal hernia    Hypertension    Obesities, morbid (HCC)    PONV (postoperative nausea and vomiting)    Pre-diabetes    Sleep apnea    BiPap    Past Surgical History:  Procedure Laterality Date   BREAST REDUCTION SURGERY     BREAST SURGERY     BUNIONECTOMY  01/13/1991   bilateral   CARDIAC CATHETERIZATION N/A 07/30/2014   Procedure: Left Heart Cath and Coronary Angiography;  Surgeon: Tonny Bollman, MD;  Location: [facility] INVASIVE CV LAB;  Service: Cardiovascular;  Laterality: N/A;   CHOLECYSTECTOMY  01/13/2003   HERNIA REPAIR  01/13/1980   KNEE SURGERY  01/12/2001   Righ knee -- chondal implant   TOTAL KNEE ARTHROPLASTY Left 07/30/2020   Procedure: LEFT TOTAL KNEE ARTHROPLASTY;  Surgeon: [name], MD;  Location: [facility] OR;  Service: Orthopedics;  Laterality: Left;    There were  no vitals filed for this visit.   Subjective Assessment - 10/03/20 1015     Subjective sore after last session    Limitations Standing;Walking    Patient Stated Goals impove knee mobility, regain strength and function, walk without a limp    Currently in Pain? No/denies                [facility] PT Assessment - 10/03/20 1048       Assessment   Medical Diagnosis Z96.659 (ICD-10-CM) - Status post total knee replacement, unspecified laterality    Referring Provider (PT) [name], MD    Onset Date/Surgical Date 07/30/20      AROM   Left Knee Extension -2    Left Knee Flexion 104                           [facility] Adult PT Treatment/Exercise - 10/03/20 1015       Knee/Hip Exercises: Aerobic   Recumbent Bike L4 x 8 min; seat 6      Knee/Hip Exercises: Machines for Strengthening   Cybex Knee Extension eccentric Lt lowering 10 lbs 3 x 10    Cybex Knee  Flexion 15# LLE eccentric; bil concentric 3x10    Cybex Leg Press 100# bil push 3x10 with 1 min flexion hold between sets      Knee/Hip Exercises: Seated   Other Seated Knee/Hip Exercises seated SLR on Lt 3x10 with 2#      Vasopneumatic   Number Minutes Vasopneumatic  10 minutes    Vasopnuematic Location  Knee    Vasopneumatic Pressure Medium    Vasopneumatic Temperature  34      Manual Therapy   Manual therapy comments Lt knee flexion in sitting prior to measurements                       PT Short Term Goals - 09/10/20 1045       PT SHORT TERM GOAL #1   Title Independent with initial HEP    Status Achieved      PT SHORT TERM GOAL #2   Baseline AROM 0-100 degrees on 09/10/2020    Status Achieved               PT Long Term Goals - 09/26/20 0943       PT LONG TERM GOAL #1   Title Independent with final HEP    Time 8    Status On-going    Target Date 10/10/20      PT LONG TERM GOAL #2   Title Rt knee AROM improved 0-110 for improved function    Status On-going     Target Date 10/10/20      PT LONG TERM GOAL #3   Title Amb independently without significant deviations for improved function    Time 8    Status On-going    Target Date 10/10/20      PT LONG TERM GOAL #4   Title Report pain < 3/10 for improved function    Time 8    Status On-going    Target Date 10/10/20      PT LONG TERM GOAL #5   Title FOTO score improved to 64 for improved function    Baseline FOTO 63% on 09/10/2020    Time 8    Status On-going    Target Date 10/10/20                   Plan - 10/03/20 1057     Clinical Impression Statement Increased soreness noted today following strengthening session last visit.  Overall soreness improving and likely due to increased strengthening exercises.  Still on track for d/c next week.    Personal Factors and Comorbidities Comorbidity 3+    Comorbidities hx DVT, HTN, obseity, sleep apnea    Examination-Activity Limitations Sit;Bend;Squat;Stairs;Stand;Transfers;Locomotion Level    Examination-Participation Restrictions Cleaning;Community Activity;Shop;Driving;Meal Prep;Laundry    Stability/Clinical Decision Making Evolving/Moderate complexity    Rehab Potential Good    PT Frequency 2x / week    PT Duration 8 weeks    PT Treatment/Interventions ADLs/Self Care Home Management;Cryotherapy;Electrical Stimulation;Moist Heat;Balance training;Therapeutic exercise;Therapeutic activities;Functional mobility training;Stair training;Gait training;Ultrasound;DME Instruction;Neuromuscular re-education;Patient/family education;Manual techniques;Vasopneumatic Device;Dry needling;Passive range of motion    PT Next Visit Plan Dynamic and compliant surface balance, continued functional strengthening (stairs); begin d/c planning    PT Home Exercise Plan Access Code: [email or web address]    Consulted and Agree with Plan of Care Patient             Patient will benefit from skilled therapeutic intervention in order to improve the following deficits and  impairments:  Abnormal gait, Obesity,  Increased edema, Decreased scar mobility, Decreased strength, Difficulty walking, Decreased mobility, Decreased balance, Pain, Impaired flexibility, Decreased range of motion  Visit Diagnosis: Acute pain of left knee  Stiffness of left knee, not elsewhere classified  Other abnormalities of gait and mobility  Muscle weakness (generalized)  Localized edema     Problem List Patient Active Problem List   Diagnosis Date Noted   Overweight 08/29/2020   Essential hypertension 08/29/2020   Murmur 08/29/2020   Arthritis of left knee    S/P total knee arthroplasty, left 07/30/2020   Unilateral primary osteoarthritis, left knee 05/07/2016   Abnormal stress ECG with treadmill 07/30/2014   Palpitation 06/22/2014   Dyspnea 06/22/2014      Clarita Crane, PT, DPT 10/03/20 10:59 AM     [facility] Physical Therapy [address] [place], [place], [address] Phone: [phone]   Fax:  [phone]  Name: TERRILEE DUDZIK MRN: [id number] Date of Birth: November 03, 1960

## 2020-10-08 ENCOUNTER — Other Ambulatory Visit: Payer: Self-pay

## 2020-10-08 ENCOUNTER — Ambulatory Visit (INDEPENDENT_AMBULATORY_CARE_PROVIDER_SITE_OTHER): Payer: Federal, State, Local not specified - PPO | Admitting: Rehabilitative and Restorative Service Providers"

## 2020-10-08 ENCOUNTER — Encounter: Payer: Self-pay | Admitting: Rehabilitative and Restorative Service Providers"

## 2020-10-08 DIAGNOSIS — R600 Localized edema: Secondary | ICD-10-CM

## 2020-10-08 DIAGNOSIS — R2689 Other abnormalities of gait and mobility: Secondary | ICD-10-CM | POA: Diagnosis not present

## 2020-10-08 DIAGNOSIS — M6281 Muscle weakness (generalized): Secondary | ICD-10-CM

## 2020-10-08 DIAGNOSIS — M25662 Stiffness of left knee, not elsewhere classified: Secondary | ICD-10-CM | POA: Diagnosis not present

## 2020-10-08 DIAGNOSIS — M25562 Pain in left knee: Secondary | ICD-10-CM | POA: Diagnosis not present

## 2020-10-08 NOTE — Therapy (Signed)
[facility] Physical Therapy [address] [place], [place], [address] Phone: [phone]   Fax:  [phone]  Physical Therapy Treatment  Patient Details  Name: Bethany Kemp MRN: [id number] Date of Birth: 02/25/1960 Referring Provider (PT): August Saucer Corrie Mckusick, MD   Encounter Date: 10/08/2020   PT End of Session - 10/08/20 1014     Visit Number 16    Number of Visits 17    Date for PT Re-Evaluation 10/10/20    Authorization Type BCBS $25 copay    Progress Note Due on Visit 18    PT Start Time 1011    PT Stop Time 1050    PT Time Calculation (min) 39 min    Activity Tolerance Patient tolerated treatment well    Behavior During Therapy WFL for tasks assessed/performed             Past Medical History:  Diagnosis Date   Anemia    Arthritis    Asthma    Complication of anesthesia    Diverticulitis    DVT (deep venous thrombosis) (HCC)    after knee surgery   Family history of adverse reaction to anesthesia    sister had a hard time waking up   Gastritis    1980's was hospitalized   GERD (gastroesophageal reflux disease)    Heart murmur    History of hiatal hernia    Hypertension    Obesities, morbid (HCC)    PONV (postoperative nausea and vomiting)    Pre-diabetes    Sleep apnea    BiPap    Past Surgical History:  Procedure Laterality Date   BREAST REDUCTION SURGERY     BREAST SURGERY     BUNIONECTOMY  01/13/1991   bilateral   CARDIAC CATHETERIZATION N/A 07/30/2014   Procedure: Left Heart Cath and Coronary Angiography;  Surgeon: Tonny Bollman, MD;  Location: [facility] INVASIVE CV LAB;  Service: Cardiovascular;  Laterality: N/A;   CHOLECYSTECTOMY  01/13/2003   HERNIA REPAIR  01/13/1980   KNEE SURGERY  01/12/2001   Righ knee -- chondal implant   TOTAL KNEE ARTHROPLASTY Left 07/30/2020   Procedure: LEFT TOTAL KNEE ARTHROPLASTY;  Surgeon: [name], MD;  Location: [facility] OR;  Service: Orthopedics;  Laterality: Left;    There were  no vitals filed for this visit.   Subjective Assessment - 10/08/20 1014     Subjective Pt. indicated stiffness primarily.  Pt. stated she was getting back to the gym for upcoming d/c from therapy.    Limitations Standing;Walking    Patient Stated Goals impove knee mobility, regain strength and function, walk without a limp    Currently in Pain? No/denies                               [facility] Adult PT Treatment/Exercise - 10/08/20 0001       Neuro Re-ed    Neuro Re-ed Details  fitter wobble board fwd/back 30x, SLS c cone touching anterior, anterior/medial, anterior/lateral x 8 each bilateral      Knee/Hip Exercises: Stretches   Gastroc Stretch 60 seconds;3 reps;Both   incline board     Knee/Hip Exercises: Aerobic   Recumbent Bike Lvl 4 6 mins      Knee/Hip Exercises: Machines for Strengthening   Cybex Knee Extension eccentric Lt lowering 10 lbs 3 x 10    Cybex Leg Press 100# bil push 3x10 with 1 min flexion hold between sets  Knee/Hip Exercises: Standing   Lateral Step Up Step Height: 6";2 sets;10 reps;Left   eccentric lowering focus                      PT Short Term Goals - 09/10/20 1045       PT SHORT TERM GOAL #1   Title Independent with initial HEP    Status Achieved      PT SHORT TERM GOAL #2   Baseline AROM 0-100 degrees on 09/10/2020    Status Achieved               PT Long Term Goals - 09/26/20 0943       PT LONG TERM GOAL #1   Title Independent with final HEP    Time 8    Status On-going    Target Date 10/10/20      PT LONG TERM GOAL #2   Title Rt knee AROM improved 0-110 for improved function    Status On-going    Target Date 10/10/20      PT LONG TERM GOAL #3   Title Amb independently without significant deviations for improved function    Time 8    Status On-going    Target Date 10/10/20      PT LONG TERM GOAL #4   Title Report pain < 3/10 for improved function    Time 8    Status On-going     Target Date 10/10/20      PT LONG TERM GOAL #5   Title FOTO score improved to 64 for improved function    Baseline FOTO 63% on 09/10/2020    Time 8    Status On-going    Target Date 10/10/20                   Plan - 10/08/20 1031     Clinical Impression Statement Pt. continued to shoe good progress towards d/c to HEP on next visit.  Incorporated more movement coordination activity today to improve single leg stance control c fair to good control.  Fatigue noted in quad strengthening intervention at end of session.    Personal Factors and Comorbidities Comorbidity 3+    Comorbidities hx DVT, HTN, obseity, sleep apnea    Examination-Activity Limitations Sit;Bend;Squat;Stairs;Stand;Transfers;Locomotion Level    Examination-Participation Restrictions Cleaning;Community Activity;Shop;Driving;Meal Prep;Laundry    Stability/Clinical Decision Making Evolving/Moderate complexity    Rehab Potential Good    PT Frequency 2x / week    PT Duration 8 weeks    PT Treatment/Interventions ADLs/Self Care Home Management;Cryotherapy;Electrical Stimulation;Moist Heat;Balance training;Therapeutic exercise;Therapeutic activities;Functional mobility training;Stair training;Gait training;Ultrasound;DME Instruction;Neuromuscular re-education;Patient/family education;Manual techniques;Vasopneumatic Device;Dry needling;Passive range of motion    PT Next Visit Plan D/C to HEP anticipated    PT Home Exercise Plan Access Code: [email or web address]    Consulted and Agree with Plan of Care Patient             Patient will benefit from skilled therapeutic intervention in order to improve the following deficits and impairments:  Abnormal gait, Obesity, Increased edema, Decreased scar mobility, Decreased strength, Difficulty walking, Decreased mobility, Decreased balance, Pain, Impaired flexibility, Decreased range of motion  Visit Diagnosis: Acute pain of left knee  Stiffness of left knee, not elsewhere  classified  Other abnormalities of gait and mobility  Muscle weakness (generalized)  Localized edema     Problem List Patient Active Problem List   Diagnosis Date Noted   Overweight 08/29/2020   Essential hypertension 08/29/2020  Murmur 08/29/2020   Arthritis of left knee    S/P total knee arthroplasty, left 07/30/2020   Unilateral primary osteoarthritis, left knee 05/07/2016   Abnormal stress ECG with treadmill 07/30/2014   Palpitation 06/22/2014   Dyspnea 06/22/2014    Chyrel Masson, PT, DPT, OCS, ATC 10/08/20  10:48 AM    [facility] Physical Therapy [address] [place], [place], [address] Phone: [phone]   Fax:  [phone]  Name: Bethany Kemp MRN: [id number] Date of Birth: July 10, 1960

## 2020-10-10 ENCOUNTER — Ambulatory Visit (INDEPENDENT_AMBULATORY_CARE_PROVIDER_SITE_OTHER): Payer: Federal, State, Local not specified - PPO | Admitting: Rehabilitative and Restorative Service Providers"

## 2020-10-10 ENCOUNTER — Other Ambulatory Visit: Payer: Self-pay

## 2020-10-10 ENCOUNTER — Encounter: Payer: Self-pay | Admitting: Rehabilitative and Restorative Service Providers"

## 2020-10-10 DIAGNOSIS — M25562 Pain in left knee: Secondary | ICD-10-CM

## 2020-10-10 DIAGNOSIS — M6281 Muscle weakness (generalized): Secondary | ICD-10-CM

## 2020-10-10 DIAGNOSIS — R600 Localized edema: Secondary | ICD-10-CM

## 2020-10-10 DIAGNOSIS — R2689 Other abnormalities of gait and mobility: Secondary | ICD-10-CM

## 2020-10-10 DIAGNOSIS — M25662 Stiffness of left knee, not elsewhere classified: Secondary | ICD-10-CM

## 2020-10-10 NOTE — Therapy (Signed)
[facility] Physical Therapy [address] [place], [place], [address] Phone: [phone]   Fax:  [phone]  Physical Therapy Treatment/ Discharge  Patient Details  Name: Bethany Kemp MRN: [id number] Date of Birth: 1960/10/13 Referring Provider (PT): Marlou Sa Tonna Corner, MD   Encounter Date: 10/10/2020   PT End of Session - 10/10/20 1018     Visit Number 17    Number of Visits 17    Date for PT Re-Evaluation 10/10/20    Authorization Type BCBS $25 copay    PT Start Time 1011    PT Stop Time 1051    PT Time Calculation (min) 40 min    Activity Tolerance Patient tolerated treatment well    Behavior During Therapy [facility] for tasks assessed/performed             Past Medical History:  Diagnosis Date   Anemia    Arthritis    Asthma    Complication of anesthesia    Diverticulitis    DVT (deep venous thrombosis) ([place])    after knee surgery   Family history of adverse reaction to anesthesia    sister had a hard time waking up   Gastritis    1980's was hospitalized   GERD (gastroesophageal reflux disease)    Heart murmur    History of hiatal hernia    Hypertension    Obesities, morbid (HCC)    PONV (postoperative nausea and vomiting)    Pre-diabetes    Sleep apnea    BiPap    Past Surgical History:  Procedure Laterality Date   BREAST REDUCTION SURGERY     BREAST SURGERY     BUNIONECTOMY  01/13/1991   bilateral   CARDIAC CATHETERIZATION N/A 07/30/2014   Procedure: Left Heart Cath and Coronary Angiography;  Surgeon: Sherren Mocha, MD;  Location: [place] CV LAB;  Service: Cardiovascular;  Laterality: N/A;   CHOLECYSTECTOMY  01/13/2003   HERNIA REPAIR  01/13/1980   KNEE SURGERY  01/12/2001   Righ knee -- chondal implant   TOTAL KNEE ARTHROPLASTY Left 07/30/2020   Procedure: LEFT TOTAL KNEE ARTHROPLASTY;  Surgeon: Meredith Pel, MD;  Location: [place];  Service: Orthopedics;  Laterality: Left;    There were no vitals filed for this  visit.   Subjective Assessment - 10/10/20 1018     Subjective No complaints of pain, just some soreness.  Comfortable c d/c to HEP plan.    Limitations Standing;Walking    Patient Stated Goals impove knee mobility, regain strength and function, walk without a limp    Currently in Pain? No/denies    Pain Score 0-No pain                [facility] PT Assessment - 10/10/20 0001       Assessment   Medical Diagnosis Z96.659 (ICD-10-CM) - Status post total knee replacement, unspecified laterality    Referring Provider (PT) Marlou Sa Tonna Corner, MD    Onset Date/Surgical Date 07/30/20      Observation/Other Assessments   Focus on Therapeutic Outcomes (FOTO)  update 67%      Functional Tests   Functional tests Single leg stance      Single Leg Stance   Comments Rt SLS: 15 seconds   Lt SLS: 17 seconds      AROM   Left Knee Extension -2    Left Knee Flexion 104      Strength   Right Knee Flexion 5/5    Right Knee Extension 5/5  Left Knee Flexion 5/5    Left Knee Extension 5/5      Ambulation/Gait   Gait Comments Independent ambulation                           [facility] Adult PT Treatment/Exercise - 10/10/20 0001       Knee/Hip Exercises: Aerobic   Recumbent Bike Lvl 4 10 mins, seat 6      Knee/Hip Exercises: Machines for Strengthening   Cybex Knee Extension eccentric Lt lowering 10 lbs 3 x 10    Cybex Knee Flexion 15# LLE eccentric; bil concentric 3x10    Cybex Leg Press 100# bil push 3x10 with 1 min flexion hold between sets      Knee/Hip Exercises: Standing   Other Standing Knee Exercises lateral stepping green band around calf 10 ft x 3 each way    Other Standing Knee Exercises ascending/descending flight of stairs c Rt hand rail doing down, then Lt hand rail going down, reciprocal gait pattern      Knee/Hip Exercises: Seated   Sit to Sand without UE support;15 reps                       PT Short Term Goals - 09/10/20 1045       PT  SHORT TERM GOAL #1   Title Independent with initial HEP    Status Achieved      PT SHORT TERM GOAL #2   Baseline AROM 0-100 degrees on 09/10/2020    Status Achieved               PT Long Term Goals - 10/10/20 1030       PT LONG TERM GOAL #1   Title Independent with final HEP    Time 8    Status Achieved      PT LONG TERM GOAL #2   Title Rt knee AROM improved 0-110 for improved function    Status Partially Met      PT LONG TERM GOAL #3   Title Amb independently without significant deviations for improved function    Time 8    Status Achieved      PT LONG TERM GOAL #4   Title Report pain < 3/10 for improved function    Time 8    Status Achieved      PT LONG TERM GOAL #5   Title FOTO score improved to 64 for improved function    Baseline FOTO 63% on 09/10/2020    Time 8    Status Achieved                   Plan - 10/10/20 1027     Clinical Impression Statement Pt. has attended 17 visits overall during course of treatment, making good progress as noted in subjective and objective data updates. Pt. has reaches or made significant progress towards all goals and is appropriate for d/c to HEP at this time for continued gains. Pt. demonstrated good knowledge of HEP at this time.    Personal Factors and Comorbidities Comorbidity 3+    Comorbidities hx DVT, HTN, obseity, sleep apnea    Examination-Activity Limitations Sit;Bend;Squat;Stairs;Stand;Transfers;Locomotion Level    Examination-Participation Restrictions Cleaning;Community Activity;Shop;Driving;Meal Prep;Laundry    Stability/Clinical Decision Making Evolving/Moderate complexity    Rehab Potential Good    PT Treatment/Interventions ADLs/Self Care Home Management;Cryotherapy;Electrical Stimulation;Moist Heat;Balance training;Therapeutic exercise;Therapeutic activities;Functional mobility training;Stair training;Gait training;Ultrasound;DME Instruction;Neuromuscular re-education;Patient/family education;Manual  techniques;Vasopneumatic Device;Dry needling;Passive range of motion    PT Next Visit Plan D/C to HEP    PT Home Exercise Plan Access Code: [email or web address]    Consulted and Agree with Plan of Care Patient             Patient will benefit from skilled therapeutic intervention in order to improve the following deficits and impairments:  Abnormal gait, Obesity, Increased edema, Decreased scar mobility, Decreased strength, Difficulty walking, Decreased mobility, Decreased balance, Pain, Impaired flexibility, Decreased range of motion  Visit Diagnosis: Acute pain of left knee  Stiffness of left knee, not elsewhere classified  Other abnormalities of gait and mobility  Muscle weakness (generalized)  Localized edema     Problem List Patient Active Problem List   Diagnosis Date Noted   Overweight 08/29/2020   Essential hypertension 08/29/2020   Murmur 08/29/2020   Arthritis of left knee    S/P total knee arthroplasty, left 07/30/2020   Unilateral primary osteoarthritis, left knee 05/07/2016   Abnormal stress ECG with treadmill 07/30/2014   Palpitation 06/22/2014   Dyspnea 06/22/2014   PHYSICAL THERAPY DISCHARGE SUMMARY  Visits from Start of Care: 17  Current functional level related to goals / functional outcomes: See note   Remaining deficits: See note   Education / Equipment: HEP   Patient agrees to discharge. Patient goals were met. Patient is being discharged due to being pleased with the current functional level.  Scot Jun, PT, DPT, OCS, ATC 10/10/20  10:48 AM     [facility] Physical Therapy [address] [place], [place], [address] Phone: [phone]   Fax:  [phone]  Name: Bethany Kemp MRN: [id number] Date of Birth: [birth year]

## 2020-10-23 ENCOUNTER — Other Ambulatory Visit: Payer: Self-pay

## 2020-10-23 ENCOUNTER — Ambulatory Visit (INDEPENDENT_AMBULATORY_CARE_PROVIDER_SITE_OTHER): Payer: Federal, State, Local not specified - PPO | Admitting: Orthopedic Surgery

## 2020-10-23 ENCOUNTER — Encounter: Payer: Self-pay | Admitting: Orthopedic Surgery

## 2020-10-23 DIAGNOSIS — Z96652 Presence of left artificial knee joint: Secondary | ICD-10-CM

## 2020-10-23 MED ORDER — AMOXICILLIN 500 MG PO CAPS: ORAL_CAPSULE | ORAL | 0 refills | Status: DC

## 2020-10-23 NOTE — Progress Notes (Signed)
Post-Op Visit Note   Patient: Bethany Kemp           Date of Birth: [birth year]           MRN: [id number] Visit Date: 10/23/2020 PCP: Pcp, No   Assessment & Plan:  Chief Complaint:  Chief Complaint  Patient presents with   Left Knee - Follow-up, Routine Post Op   Visit Diagnoses:  1. S/P total knee arthroplasty, left     Plan: Dia Sitter is a patient who is now 3 months out left total knee replacement.  She is doing well.  Following exercise protocol from physical therapy.  Goes to the gym 3 times a week.  Having mild pain on the right-hand side.  Left knee looks good with full extension flexion to 110 no effusion collaterals are stable.  Plan at this time is antibiotic prophylaxis for the next year.  Amoxicillin prescription written.  Follow-up with [place] as needed.  She may need knee replacement on the right in the near future but for now she is going to try to manage as best she can.  Follow-Up Instructions: Return if symptoms worsen or fail to improve.   Orders:  No orders of the defined types were placed in this encounter.  Meds ordered this encounter  Medications   amoxicillin (AMOXIL) 500 MG capsule    Sig: 2g 1 hour prior to dental procedure    Dispense:  15 capsule    Refill:  0    Imaging: No results found.  PMFS History: Patient Active Problem List   Diagnosis Date Noted   Overweight 08/29/2020   Essential hypertension 08/29/2020   Murmur 08/29/2020   Arthritis of left knee    S/P total knee arthroplasty, left 07/30/2020   Unilateral primary osteoarthritis, left knee 05/07/2016   Abnormal stress ECG with treadmill 07/30/2014   Palpitation 06/22/2014   Dyspnea 06/22/2014   Past Medical History:  Diagnosis Date   Anemia    Arthritis    Asthma    Complication of anesthesia    Diverticulitis    DVT (deep venous thrombosis) (HCC)    after knee surgery   Family history of adverse reaction to anesthesia    sister had a hard time waking up   Gastritis     1980's was hospitalized   GERD (gastroesophageal reflux disease)    Heart murmur    History of hiatal hernia    Hypertension    Obesities, morbid (HCC)    PONV (postoperative nausea and vomiting)    Pre-diabetes    Sleep apnea    BiPap    Family History  Problem Relation Age of Onset   Asthma Father    Heart attack Mother 44       Died with MI age 59   Arthritis Mother    Diabetes Mother    Stroke Sister    Breast cancer Sister    Asthma Daughter     Past Surgical History:  Procedure Laterality Date   BREAST REDUCTION SURGERY     BREAST SURGERY     BUNIONECTOMY  01/13/1991   bilateral   CARDIAC CATHETERIZATION N/A 07/30/2014   Procedure: Left Heart Cath and Coronary Angiography;  Surgeon: Tonny Bollman, MD;  Location: [facility] INVASIVE CV LAB;  Service: Cardiovascular;  Laterality: N/A;   CHOLECYSTECTOMY  01/13/2003   HERNIA REPAIR  01/13/1980   KNEE SURGERY  01/12/2001   Righ knee -- chondal implant   TOTAL KNEE ARTHROPLASTY Left 07/30/2020  Procedure: LEFT TOTAL KNEE ARTHROPLASTY;  Surgeon: [name], MD;  Location: [facility] OR;  Service: Orthopedics;  Laterality: Left;   Social History   Occupational History   Not on file  Tobacco Use   Smoking status: Never   Smokeless tobacco: Never  Vaping Use   Vaping Use: Never used  Substance and Sexual Activity   Alcohol use: No   Drug use: No   Sexual activity: Not on file

## 2021-02-07 DIAGNOSIS — Z6838 Body mass index (BMI) 38.0-38.9, adult: Secondary | ICD-10-CM | POA: Diagnosis not present

## 2021-02-07 DIAGNOSIS — E785 Hyperlipidemia, unspecified: Secondary | ICD-10-CM | POA: Diagnosis not present

## 2021-02-07 DIAGNOSIS — E669 Obesity, unspecified: Secondary | ICD-10-CM | POA: Diagnosis not present

## 2021-02-07 DIAGNOSIS — R7303 Prediabetes: Secondary | ICD-10-CM | POA: Diagnosis not present

## 2021-02-11 DIAGNOSIS — G8929 Other chronic pain: Secondary | ICD-10-CM | POA: Diagnosis not present

## 2021-02-11 DIAGNOSIS — E669 Obesity, unspecified: Secondary | ICD-10-CM | POA: Diagnosis not present

## 2021-02-11 DIAGNOSIS — R7303 Prediabetes: Secondary | ICD-10-CM | POA: Diagnosis not present

## 2021-02-11 DIAGNOSIS — Z6839 Body mass index (BMI) 39.0-39.9, adult: Secondary | ICD-10-CM | POA: Diagnosis not present

## 2021-03-27 DIAGNOSIS — Z1231 Encounter for screening mammogram for malignant neoplasm of breast: Secondary | ICD-10-CM | POA: Diagnosis not present

## 2021-03-27 DIAGNOSIS — R928 Other abnormal and inconclusive findings on diagnostic imaging of breast: Secondary | ICD-10-CM | POA: Diagnosis not present

## 2021-04-25 DIAGNOSIS — Z1231 Encounter for screening mammogram for malignant neoplasm of breast: Secondary | ICD-10-CM | POA: Diagnosis not present

## 2021-05-06 DIAGNOSIS — G8929 Other chronic pain: Secondary | ICD-10-CM | POA: Diagnosis not present

## 2021-05-06 DIAGNOSIS — M5459 Other low back pain: Secondary | ICD-10-CM | POA: Diagnosis not present

## 2021-05-06 DIAGNOSIS — M25569 Pain in unspecified knee: Secondary | ICD-10-CM | POA: Diagnosis not present

## 2021-05-14 DIAGNOSIS — M5459 Other low back pain: Secondary | ICD-10-CM | POA: Diagnosis not present

## 2021-05-14 DIAGNOSIS — G8929 Other chronic pain: Secondary | ICD-10-CM | POA: Diagnosis not present

## 2021-05-14 DIAGNOSIS — M25569 Pain in unspecified knee: Secondary | ICD-10-CM | POA: Diagnosis not present

## 2021-05-20 DIAGNOSIS — M25569 Pain in unspecified knee: Secondary | ICD-10-CM | POA: Diagnosis not present

## 2021-05-20 DIAGNOSIS — M5459 Other low back pain: Secondary | ICD-10-CM | POA: Diagnosis not present

## 2021-05-20 DIAGNOSIS — G8929 Other chronic pain: Secondary | ICD-10-CM | POA: Diagnosis not present

## 2021-05-26 DIAGNOSIS — M5459 Other low back pain: Secondary | ICD-10-CM | POA: Diagnosis not present

## 2021-05-26 DIAGNOSIS — M25569 Pain in unspecified knee: Secondary | ICD-10-CM | POA: Diagnosis not present

## 2021-05-26 DIAGNOSIS — G8929 Other chronic pain: Secondary | ICD-10-CM | POA: Diagnosis not present

## 2021-06-06 ENCOUNTER — Ambulatory Visit
Admission: EM | Admit: 2021-06-06 | Discharge: 2021-06-06 | Disposition: A | Discharge status: Home / Self Care | Payer: Federal, State, Local not specified - PPO | Attending: Physician Assistant | Admitting: Physician Assistant

## 2021-06-06 ENCOUNTER — Encounter: Payer: Self-pay | Admitting: Emergency Medicine

## 2021-06-06 ENCOUNTER — Other Ambulatory Visit: Payer: Self-pay

## 2021-06-06 DIAGNOSIS — J0120 Acute ethmoidal sinusitis, unspecified: Secondary | ICD-10-CM | POA: Diagnosis not present

## 2021-06-06 MED ORDER — AMOXICILLIN 500 MG PO CAPS: 500.0000 mg | ORAL_CAPSULE | Freq: Three times a day (TID) | ORAL | 0 refills | Status: DC

## 2021-06-06 MED ORDER — BENZONATATE 100 MG PO CAPS: 100.0000 mg | ORAL_CAPSULE | Freq: Four times a day (QID) | ORAL | 0 refills | Status: DC | PRN

## 2021-06-06 NOTE — ED Triage Notes (Signed)
Patient c/o nasal congestion and productive cough w/ " yellow" sputum x 2 days.   Patient denies fever.   Patient endorses sinus pressure.   Patient has taken Claritin with no relief of symptoms.

## 2021-06-06 NOTE — ED Provider Notes (Signed)
[name]    CSN: [id number] Arrival date & time: 06/06/21  0802      History   Chief Complaint Chief Complaint  Patient presents with   Nasal Congestion   Cough    HPI Bethany Kemp is a 61 y.o. female.   Pt complains of sinus pressure and congestion.  Pt reports she gets frequent sinus infections   The history is provided by the patient. No language interpreter was used.  Cough Cough characteristics:  Productive Severity:  Moderate Onset quality:  Gradual Timing:  Constant Chronicity:  New Smoker: no   Relieved by:  Nothing Worsened by:  Nothing Ineffective treatments:  None tried Associated symptoms: no sinus congestion    Past Medical History:  Diagnosis Date   Anemia    Arthritis    Asthma    Complication of anesthesia    Diverticulitis    DVT (deep venous thrombosis) (HCC)    after knee surgery   Family history of adverse reaction to anesthesia    sister had a hard time waking up   Gastritis    1980's was hospitalized   GERD (gastroesophageal reflux disease)    Heart murmur    History of hiatal hernia    Hypertension    Obesities, morbid (HCC)    PONV (postoperative nausea and vomiting)    Pre-diabetes    Sleep apnea    BiPap    Patient Active Problem List   Diagnosis Date Noted   Overweight 08/29/2020   Essential hypertension 08/29/2020   Murmur 08/29/2020   Arthritis of left knee    S/P total knee arthroplasty, left 07/30/2020   Unilateral primary osteoarthritis, left knee 05/07/2016   Abnormal stress ECG with treadmill 07/30/2014   Palpitation 06/22/2014   Dyspnea 06/22/2014    Past Surgical History:  Procedure Laterality Date   BREAST REDUCTION SURGERY     BREAST SURGERY     BUNIONECTOMY  01/13/1991   bilateral   CARDIAC CATHETERIZATION N/A 07/30/2014   Procedure: Left Heart Cath and Coronary Angiography;  Surgeon: Tonny BollmanMichael Cooper, MD;  [facility];  Service: Cardiovascular;  Laterality: N/A;    CHOLECYSTECTOMY  01/13/2003   HERNIA REPAIR  01/13/1980   KNEE SURGERY  01/12/2001   Righ knee -- chondal implant   TOTAL KNEE ARTHROPLASTY Left 07/30/2020   Procedure: LEFT TOTAL KNEE ARTHROPLASTY;  Surgeon: Cammy Copaean, Gregory Scott, MD;  [facility];  Service: Orthopedics;  Laterality: Left;    OB History   No obstetric history on file.      Home Medications    Prior to Admission medications   Medication Sig Start Date End Date Taking? Authorizing Provider  amitriptyline (ELAVIL) 25 MG tablet Take 75 mg by mouth at bedtime.   Yes [provider]  amLODipine (NORVASC) 10 MG tablet Take 1 tablet (10 mg total) by mouth daily. 08/03/18  Yes Rollene RotundaHochrein, James, MD  amoxicillin (AMOXIL) 500 MG capsule Take 1 capsule (500 mg total) by mouth 3 (three) times daily. 06/06/21  Yes Cheron SchaumannSofia, Burech Mcfarland K, PA-C  atenolol (TENORMIN) 50 MG tablet Take 50 mg by mouth daily.   Yes [provider]  benzonatate (TESSALON PERLES) 100 MG capsule Take 1 capsule (100 mg total) by mouth every 6 (six) hours as needed for cough. 06/06/21 06/06/22 Yes Elson AreasSofia, Demontae Antunes K, PA-C  cholecalciferol (VITAMIN D) 25 MCG (1000 UNIT) tablet Take 2,000 Units by mouth daily. 01/16/19  Yes [provider]  hydrochlorothiazide (HYDRODIURIL) 25  MG tablet Take 12.5 tablets by mouth daily as needed (fluid). 08/12/14  Yes [provider]  loratadine (CLARITIN) 10 MG tablet Take 10 mg by mouth daily as needed for allergies.   Yes [provider]  losartan (COZAAR) 50 MG tablet Take 50 mg by mouth daily.   Yes [provider]  mometasone (ASMANEX) 220 MCG/INH inhaler Inhale 2 puffs into the lungs at bedtime.   Yes [provider]  omeprazole (PRILOSEC) 20 MG capsule Take 20 mg by mouth 2 (two) times daily. 08/12/14  Yes [provider]  albuterol (PROVENTIL HFA;VENTOLIN HFA) 108 (90 BASE) MCG/ACT inhaler Inhale 2 puffs into the lungs every 4 (four) hours as needed for wheezing or  shortness of breath.    [provider]  diclofenac Sodium (VOLTAREN) 1 % GEL Apply 2 g topically daily as needed (pain). 03/02/19   [provider]  fluticasone (FLONASE) 50 MCG/ACT nasal spray Place 2 sprays into both nostrils daily as needed for allergies. 03/02/19   [provider]  gabapentin (NEURONTIN) 400 MG capsule Take 400 mg by mouth at bedtime as needed (pain).    [provider]  hydroxypropyl methylcellulose / hypromellose (ISOPTO TEARS / GONIOVISC) 2.5 % ophthalmic solution Place 1 drop into both eyes 3 (three) times daily as needed for dry eyes.    [provider]  methocarbamol (ROBAXIN) 500 MG tablet Take 1 tablet (500 mg total) by mouth every 8 (eight) hours as needed for muscle spasms. 07/31/20   Magnant, Joycie Peek, PA-C    Family History Family History  Problem Relation Age of Onset   Asthma Father    Heart attack Mother 57       Died with MI age 34   Arthritis Mother    Diabetes Mother    Stroke Sister    Breast cancer Sister    Asthma Daughter     Social History Social History   Tobacco Use   Smoking status: Never   Smokeless tobacco: Never  Vaping Use   Vaping Use: Never used  Substance Use Topics   Alcohol use: No   Drug use: No     Allergies   Ibuprofen, Lisinopril, Acetaminophen, Aspirin, and Spironolactone   Review of Systems Review of Systems  Respiratory:  Positive for cough.   All other systems reviewed and are negative.   Physical Exam Triage Vital Signs ED Triage Vitals  Enc Vitals Group     BP 06/06/21 0811 (!) 143/78     Pulse Rate 06/06/21 0811 84     Resp 06/06/21 0811 16     Temp 06/06/21 0811 98.3 F (36.8 C)     Temp Source 06/06/21 0811 Oral     SpO2 06/06/21 0811 98 %     Weight --      Height --      Head Circumference --      Peak Flow --      Pain Score 06/06/21 0813 5     Pain Loc --      Pain Edu? --      Excl. in GC? --    No data found.  Updated Vital Signs BP  (!) 143/78 (BP Location: Left Arm)   Pulse 84   Temp 98.3 F (36.8 C) (Oral)   Resp 16   LMP 01/13/2015   SpO2 98%   Visual Acuity Right Eye Distance:   Left Eye Distance:   Bilateral Distance:    Right Eye  Near:   Left Eye Near:    Bilateral Near:     Physical Exam Vitals and nursing note reviewed.  Constitutional:      Appearance: She is well-developed.  HENT:     Head: Normocephalic.     Comments: Tender maxillary sinuses     Right Ear: Tympanic membrane normal.     Left Ear: Tympanic membrane normal.     Nose: Nose normal.     Mouth/Throat:     Mouth: Mucous membranes are moist.  Eyes:     Pupils: Pupils are equal, round, and reactive to light.  Cardiovascular:     Rate and Rhythm: Normal rate.  Pulmonary:     Effort: Pulmonary effort is normal.  Abdominal:     General: There is no distension.  Musculoskeletal:        General: Normal range of motion.     Cervical back: Normal range of motion.  Skin:    General: Skin is warm.  Neurological:     Mental Status: She is alert and oriented to person, place, and time.  Psychiatric:        Mood and Affect: Mood normal.     UC Treatments / Results  Labs (all labs ordered are listed, but only abnormal results are displayed) Labs Reviewed - No data to display  EKG   Radiology No results found.  Procedures Procedures (including critical care time)  Medications Ordered in UC Medications - No data to display  Initial Impression / Assessment and Plan / UC Course  I have reviewed the triage vital signs and the nursing notes.  Pertinent labs & imaging results that were available during my care of the patient were reviewed by me and considered in my medical decision making (see chart for details).      Final Clinical Impressions(s) / UC Diagnoses   Final diagnoses:  Acute ethmoidal sinusitis, recurrence not specified     Discharge Instructions      Return if any problems    ED Prescriptions      Medication Sig Dispense Auth. Provider   amoxicillin (AMOXIL) 500 MG capsule Take 1 capsule (500 mg total) by mouth 3 (three) times daily. 30 capsule Tali Coster K, [place]   benzonatate (TESSALON PERLES) 100 MG capsule Take 1 capsule (100 mg total) by mouth every 6 (six) hours as needed for cough. 30 capsule Elson Areas, [place]      PDMP not reviewed this encounter. An After Visit Summary was printed and given to the patient.    Elson Areas, [place] 06/06/21 [phone]

## 2021-06-06 NOTE — Discharge Instructions (Signed)
Return if any problems.

## 2021-06-11 ENCOUNTER — Encounter: Payer: Self-pay | Admitting: Orthopedic Surgery

## 2021-06-11 ENCOUNTER — Ambulatory Visit (INDEPENDENT_AMBULATORY_CARE_PROVIDER_SITE_OTHER): Payer: Federal, State, Local not specified - PPO

## 2021-06-11 ENCOUNTER — Ambulatory Visit: Payer: Federal, State, Local not specified - PPO | Admitting: Orthopedic Surgery

## 2021-06-11 VITALS — Ht 64.0 in | Wt 218.2 lb

## 2021-06-11 DIAGNOSIS — M1711 Unilateral primary osteoarthritis, right knee: Secondary | ICD-10-CM

## 2021-06-11 DIAGNOSIS — M79605 Pain in left leg: Secondary | ICD-10-CM

## 2021-06-11 DIAGNOSIS — M25561 Pain in right knee: Secondary | ICD-10-CM

## 2021-06-11 DIAGNOSIS — M79604 Pain in right leg: Secondary | ICD-10-CM

## 2021-06-11 DIAGNOSIS — G8929 Other chronic pain: Secondary | ICD-10-CM

## 2021-06-15 MED ORDER — BUPIVACAINE HCL 0.25 % IJ SOLN
4.0000 mL | INTRAMUSCULAR | Status: AC | PRN
  Administered 2021-06-11: 4 mL via INTRA_ARTICULAR

## 2021-06-15 MED ORDER — LIDOCAINE HCL 1 % IJ SOLN
5.0000 mL | INTRAMUSCULAR | Status: AC | PRN
  Administered 2021-06-11: 5 mL

## 2021-06-15 MED ORDER — METHYLPREDNISOLONE ACETATE 40 MG/ML IJ SUSP
40.0000 mg | INTRAMUSCULAR | Status: AC | PRN
Start: 2021-06-11 — End: 2021-06-11
  Administered 2021-06-11: 40 mg via INTRA_ARTICULAR

## 2021-06-15 NOTE — Progress Notes (Signed)
Office Visit Note   Patient: Bethany Kemp           Date of Birth: 1960/10/24           MRN: [id number] Visit Date: 06/11/2021 Requested by: No referring provider defined for this encounter. PCP: Pcp, No  Subjective: Chief Complaint  Patient presents with   Right Knee - Pain    HPI: [name] is a 61 year old patient with right knee pain.  Reports having some right knee pain since she had left total knee replacement done.  That replacement was done almost a year ago.  On the right-hand side she reports some locking and popping and swelling as well as difficulty with stairs.  Patient also reports low back pain.  Hurts for her to stand more than 5 minutes.  Occasional radiation down both legs.  She has had pain for years and has tried stretching over-the-counter medication as well as a walking program none of which have helped her low back pain.              ROS: All systems reviewed are negative as they relate to the chief complaint within the history of present illness.  Patient denies  fevers or chills.   Assessment & Plan: Visit Diagnoses:  1. Chronic pain of right knee   2. Pain in right leg   3. Bilateral leg pain     Plan: Impression is severe and end-stage right knee arthritis.  Doing well from left total knee replacement.  Has had prior surgery on that right knee which goes along with some of her worsening symptoms.  Plan today is injection into the right knee.  Lumbar spine radiographs do show some arthritis as well as spondylolisthesis.  I think that is likely the culprit for her low back pain.  Plan MRI lumbar spine to evaluate low back pain and bilateral radiculopathy and possible stenosis and foraminal stenosis there in the lumbar lower regions.  Follow-Up Instructions: Return for after MRI.   Orders:  Orders Placed This Encounter  Procedures   XR KNEE 3 VIEW RIGHT   XR Lumbar Spine 2-3 Views   MR Lumbar Spine w/o contrast   No orders of the defined types were  placed in this encounter.     Procedures: Large Joint Inj: R knee on 06/11/2021 11:41 PM Indications: diagnostic evaluation, joint swelling and pain Details: 18 G 1.5 in needle, superolateral approach  Arthrogram: No  Medications: 5 mL lidocaine 1 %; 40 mg methylPREDNISolone acetate 40 MG/ML; 4 mL bupivacaine 0.25 % Outcome: tolerated well, no immediate complications Procedure, treatment alternatives, risks and benefits explained, specific risks discussed. Consent was given by the patient. Immediately prior to procedure a time out was called to verify the correct patient, procedure, equipment, support staff and site/side marked as required. Patient was prepped and draped in the usual sterile fashion.      Clinical Data: No additional findings.  Objective: Vital Signs: Ht 5\' 4"  (1.626 m)   Wt 218 lb 3.2 oz (99 kg)   LMP 01/13/2015   BMI 37.45 kg/m   Physical Exam:   Constitutional: Patient appears well-developed HEENT:  Head: Normocephalic Eyes:EOM are normal Neck: Normal range of motion Cardiovascular: Normal rate Pulmonary/chest: Effort normal Neurologic: Patient is alert Skin: Skin is warm Psychiatric: Patient has normal mood and affect   Ortho Exam: Ortho exam demonstrates palpable pedal pulses with range of motion on that right knee of 10-90.  No nerve root tension signs and  no definite paresthesias L1 S1 bilaterally.  Pedal pulses palpable.  No masses lymphadenopathy or skin changes noted in that right knee region.  No groin pain with internal/external rotation of the leg.  Negative clonus bilaterally.  Some pain with forward and lateral bending.  Specialty Comments:  No specialty comments available.  Imaging: No results found.   PMFS History: Patient Active Problem List   Diagnosis Date Noted   Overweight 08/29/2020   Essential hypertension 08/29/2020   Murmur 08/29/2020   Arthritis of left knee    S/P total knee arthroplasty, left 07/30/2020    Unilateral primary osteoarthritis, left knee 05/07/2016   Abnormal stress ECG with treadmill 07/30/2014   Palpitation 06/22/2014   Dyspnea 06/22/2014   Past Medical History:  Diagnosis Date   Anemia    Arthritis    Asthma    Complication of anesthesia    Diverticulitis    DVT (deep venous thrombosis) ([place])    after knee surgery   Family history of adverse reaction to anesthesia    sister had a hard time waking up   Gastritis    1980's was hospitalized   GERD (gastroesophageal reflux disease)    Heart murmur    History of hiatal hernia    Hypertension    Obesities, morbid (HCC)    PONV (postoperative nausea and vomiting)    Pre-diabetes    Sleep apnea    BiPap    Family History  Problem Relation Age of Onset   Asthma Father    Heart attack Mother 27       Died with MI age 59   Arthritis Mother    Diabetes Mother    Stroke Sister    Breast cancer Sister    Asthma Daughter     Past Surgical History:  Procedure Laterality Date   BREAST REDUCTION SURGERY     BREAST SURGERY     BUNIONECTOMY  01/13/1991   bilateral   CARDIAC CATHETERIZATION N/A 07/30/2014   Procedure: Left Heart Cath and Coronary Angiography;  Surgeon: Sherren Mocha, MD;  Location: [facility];  Service: Cardiovascular;  Laterality: N/A;   CHOLECYSTECTOMY  01/13/2003   HERNIA REPAIR  01/13/1980   KNEE SURGERY  01/12/2001   Righ knee -- chondal implant   TOTAL KNEE ARTHROPLASTY Left 07/30/2020   Procedure: LEFT TOTAL KNEE ARTHROPLASTY;  Surgeon: Meredith Pel, MD;  Location: [facility];  Service: Orthopedics;  Laterality: Left;   Social History   Occupational History   Not on file  Tobacco Use   Smoking status: Never   Smokeless tobacco: Never  Vaping Use   Vaping Use: Never used  Substance and Sexual Activity   Alcohol use: No   Drug use: No   Sexual activity: Not on file

## 2021-06-20 ENCOUNTER — Ambulatory Visit
Admission: RE | Admit: 2021-06-20 | Discharge: 2021-06-20 | Disposition: A | Discharge status: Home / Self Care | Payer: Federal, State, Local not specified - PPO | Source: Ambulatory Visit | Attending: Orthopedic Surgery | Admitting: Orthopedic Surgery

## 2021-06-20 DIAGNOSIS — M4807 Spinal stenosis, lumbosacral region: Secondary | ICD-10-CM | POA: Diagnosis not present

## 2021-06-20 DIAGNOSIS — M79604 Pain in right leg: Secondary | ICD-10-CM

## 2021-06-20 DIAGNOSIS — M4316 Spondylolisthesis, lumbar region: Secondary | ICD-10-CM | POA: Diagnosis not present

## 2021-06-20 DIAGNOSIS — M47816 Spondylosis without myelopathy or radiculopathy, lumbar region: Secondary | ICD-10-CM | POA: Diagnosis not present

## 2021-06-20 DIAGNOSIS — M545 Low back pain, unspecified: Secondary | ICD-10-CM | POA: Diagnosis not present

## 2021-07-07 ENCOUNTER — Ambulatory Visit: Payer: Federal, State, Local not specified - PPO | Admitting: Orthopedic Surgery

## 2021-07-07 DIAGNOSIS — M48061 Spinal stenosis, lumbar region without neurogenic claudication: Secondary | ICD-10-CM

## 2021-07-07 DIAGNOSIS — N281 Cyst of kidney, acquired: Secondary | ICD-10-CM | POA: Diagnosis not present

## 2021-07-10 ENCOUNTER — Encounter: Payer: Self-pay | Admitting: Orthopedic Surgery

## 2021-07-10 NOTE — Progress Notes (Signed)
Office Visit Note   Patient: Bethany Kemp           Date of Birth: [birth year]           MRN: [id number] Visit Date: 07/07/2021 Requested by: No referring provider defined for this encounter. PCP: Rollene Rotunda, MD  Subjective: Chief Complaint  Patient presents with   Other    Scan review    HPI: Shron is a 61 year old patient with low back pain.  She states her back is still the same.  Since her last clinic visit she has had an MRI scan of the lumbar spine which is reviewed with the patient.  Does show L4-5 moderate left-sided foraminal stenosis and mild right-sided foraminal stenosis.              ROS: All systems reviewed are negative as they relate to the chief complaint within the history of present illness.  Patient denies  fevers or chills.   Assessment & Plan: Visit Diagnoses:  1. Spinal stenosis of lumbar region, unspecified whether neurogenic claudication present   2. Cyst of right kidney     Plan: Impression is low back pain with moderate left-sided foraminal stenosis at L4-5 and mild right-sided foraminal stenosis at L4-5.  Patient also has a very large renal cyst.  I think this needs further evaluation and management with urology.  Refer to Dr. Alvester Morin for lumbar spine ESI.  Follow-Up Instructions: Return if symptoms worsen or fail to improve.   Orders:  Orders Placed This Encounter  Procedures   Ambulatory referral to Physical Medicine Rehab   Ambulatory referral to Urology   No orders of the defined types were placed in this encounter.     Procedures: No procedures performed   Clinical Data: No additional findings.  Objective: Vital Signs: LMP 01/13/2015   Physical Exam:   Constitutional: Patient appears well-developed HEENT:  Head: Normocephalic Eyes:EOM are normal Neck: Normal range of motion Cardiovascular: Normal rate Pulmonary/chest: Effort normal Neurologic: Patient is alert Skin: Skin is warm Psychiatric: Patient has normal mood  and affect   Ortho Exam: Ortho exam demonstrates normal gait alignment.  Pedal pulses palpable.  No nerve root tension signs today.  Mild pain with forward lateral bending.  Ankle dorsiflexion plantarflexion quad hamstring strength intact and no groin pain with internal/external rotation of the leg.  No definite paresthesias L1-S1 bilaterally.  Specialty Comments:  MRI LUMBAR SPINE WITHOUT CONTRAST   TECHNIQUE: Multiplanar, multisequence MR imaging of the lumbar spine was performed. No intravenous contrast was administered.   COMPARISON:  X-ray 06/11/2021   FINDINGS: Segmentation:  Standard.   Alignment:  3 mm grade 1 anterolisthesis of L4 on L5.   Vertebrae: No fracture, evidence of discitis, or bone lesion. Discogenic endplate marrow changes at L4-5 and L5-S1.   Conus medullaris and cauda equina: Conus extends to the L1 level. Conus and cauda equina appear normal.   Paraspinal and other soft tissues: Negative.   Disc levels:   T12-L1: Unremarkable.   L1-L2: Unremarkable.   L2-L3: Unremarkable.   L3-L4: Minimal annular disc bulge. Mild bilateral facet hypertrophy. No foraminal or canal stenosis.   L4-L5: Anterolisthesis with disc uncovering and diffuse disc bulge. Mild-moderate bilateral facet arthropathy. Moderate left and mild right foraminal stenosis. No canal stenosis.   L5-S1: Shallow right paracentral disc protrusion. No significant facet arthropathy. Mild bilateral foraminal stenosis. No canal stenosis.   IMPRESSION: 1. Mild lower lumbar spondylosis, most pronounced at L4-5 where there is moderate left and  mild right foraminal stenosis. 2. Mild bilateral foraminal stenosis at L5-S1. 3. No canal stenosis at any level.     Electronically Signed   By: Duanne Guess D.O.   On: 06/22/2021 09:56  Imaging: No results found.   PMFS History: Patient Active Problem List   Diagnosis Date Noted   Overweight 08/29/2020   Essential hypertension 08/29/2020    Murmur 08/29/2020   Arthritis of left knee    S/P total knee arthroplasty, left 07/30/2020   Unilateral primary osteoarthritis, left knee 05/07/2016   Abnormal stress ECG with treadmill 07/30/2014   Palpitation 06/22/2014   Dyspnea 06/22/2014   Past Medical History:  Diagnosis Date   Anemia    Arthritis    Asthma    Complication of anesthesia    Diverticulitis    DVT (deep venous thrombosis) (HCC)    after knee surgery   Family history of adverse reaction to anesthesia    sister had a hard time waking up   Gastritis    1980's was hospitalized   GERD (gastroesophageal reflux disease)    Heart murmur    History of hiatal hernia    Hypertension    Obesities, morbid (HCC)    PONV (postoperative nausea and vomiting)    Pre-diabetes    Sleep apnea    BiPap    Family History  Problem Relation Age of Onset   Asthma Father    Heart attack Mother 45       Died with MI age 73   Arthritis Mother    Diabetes Mother    Stroke Sister    Breast cancer Sister    Asthma Daughter     Past Surgical History:  Procedure Laterality Date   BREAST REDUCTION SURGERY     BREAST SURGERY     BUNIONECTOMY  01/13/1991   bilateral   CARDIAC CATHETERIZATION N/A 07/30/2014   Procedure: Left Heart Cath and Coronary Angiography;  Surgeon: Tonny Bollman, MD;  Location: [facility] INVASIVE CV LAB;  Service: Cardiovascular;  Laterality: N/A;   CHOLECYSTECTOMY  01/13/2003   HERNIA REPAIR  01/13/1980   KNEE SURGERY  01/12/2001   Righ knee -- chondal implant   TOTAL KNEE ARTHROPLASTY Left 07/30/2020   Procedure: LEFT TOTAL KNEE ARTHROPLASTY;  Surgeon: [name], MD;  Location: [facility] OR;  Service: Orthopedics;  Laterality: Left;   Social History   Occupational History   Not on file  Tobacco Use   Smoking status: Never   Smokeless tobacco: Never  Vaping Use   Vaping Use: Never used  Substance and Sexual Activity   Alcohol use: No   Drug use: No   Sexual activity: Not on file

## 2021-07-20 ENCOUNTER — Ambulatory Visit
Admission: EM | Admit: 2021-07-20 | Discharge: 2021-07-20 | Disposition: A | Discharge status: Home / Self Care | Payer: Federal, State, Local not specified - PPO | Attending: Physician Assistant | Admitting: Physician Assistant

## 2021-07-20 DIAGNOSIS — K5732 Diverticulitis of large intestine without perforation or abscess without bleeding: Secondary | ICD-10-CM | POA: Diagnosis not present

## 2021-07-20 MED ORDER — CIPROFLOXACIN HCL 500 MG PO TABS: 500.0000 mg | ORAL_TABLET | Freq: Two times a day (BID) | ORAL | 0 refills | Status: AC

## 2021-07-20 MED ORDER — METRONIDAZOLE 500 MG PO TABS: 500.0000 mg | ORAL_TABLET | Freq: Two times a day (BID) | ORAL | 0 refills | Status: AC

## 2021-07-20 MED ORDER — ONDANSETRON HCL 4 MG PO TABS: 4.0000 mg | ORAL_TABLET | Freq: Every day | ORAL | 1 refills | Status: DC | PRN

## 2021-07-20 NOTE — ED Triage Notes (Signed)
Pt presents with diverticulitis flare up; pt states she has had nausea and diarrhea since waking up this morning.

## 2021-07-21 NOTE — ED Provider Notes (Signed)
[name]    CSN: [id number] Arrival date & time: 07/20/21  0802      History   Chief Complaint Chief Complaint  Patient presents with   Diverticulitis Flare     HPI Bethany Kemp is a 61 y.o. female.   Patient is of diverticulitis she reports she has had diverticulitis in the past and is currently having exacerbation of her symptoms.  Patient reports she responds well to Flagyl and Cipro.  Patient does have a GI physician that she sees and she does have regular checkups.  Patient reports she has had some nausea no vomiting she has had some diarrhea.  Patient reports abdominal cramping although she is not currently having any significant or severe pain  The history is provided by the patient. No language interpreter was used.    Past Medical History:  Diagnosis Date   Anemia    Arthritis    Asthma    Complication of anesthesia    Diverticulitis    DVT (deep venous thrombosis) (HCC)    after knee surgery   Family history of adverse reaction to anesthesia    sister had a hard time waking up   Gastritis    1980's was hospitalized   GERD (gastroesophageal reflux disease)    Heart murmur    History of hiatal hernia    Hypertension    Obesities, morbid (HCC)    PONV (postoperative nausea and vomiting)    Pre-diabetes    Sleep apnea    BiPap    Patient Active Problem List   Diagnosis Date Noted   Overweight 08/29/2020   Essential hypertension 08/29/2020   Murmur 08/29/2020   Arthritis of left knee    S/P total knee arthroplasty, left 07/30/2020   Unilateral primary osteoarthritis, left knee 05/07/2016   Abnormal stress ECG with treadmill 07/30/2014   Palpitation 06/22/2014   Dyspnea 06/22/2014    Past Surgical History:  Procedure Laterality Date   BREAST REDUCTION SURGERY     BREAST SURGERY     BUNIONECTOMY  01/13/1991   bilateral   CARDIAC CATHETERIZATION N/A 07/30/2014   Procedure: Left Heart Cath and Coronary Angiography;  Surgeon:  Tonny Bollman, MD;  Location: [facility] INVASIVE CV LAB;  Service: Cardiovascular;  Laterality: N/A;   CHOLECYSTECTOMY  01/13/2003   HERNIA REPAIR  01/13/1980   KNEE SURGERY  01/12/2001   Righ knee -- chondal implant   TOTAL KNEE ARTHROPLASTY Left 07/30/2020   Procedure: LEFT TOTAL KNEE ARTHROPLASTY;  Surgeon: [name], MD;  Location: [facility] OR;  Service: Orthopedics;  Laterality: Left;    OB History   No obstetric history on file.      Home Medications    Prior to Admission medications   Medication Sig Start Date End Date Taking? Authorizing Provider  ciprofloxacin (CIPRO) 500 MG tablet Take 1 tablet (500 mg total) by mouth 2 (two) times daily for 10 days. 07/20/21 07/30/21 Yes Elson Areas, PA-C  metroNIDAZOLE (FLAGYL) 500 MG tablet Take 1 tablet (500 mg total) by mouth 2 (two) times daily for 10 days. 07/20/21 07/30/21 Yes Cheron Schaumann K, PA-C  ondansetron (ZOFRAN) 4 MG tablet Take 1 tablet (4 mg total) by mouth daily as needed for nausea or vomiting. 07/20/21 07/20/22 Yes Elson Areas, PA-C  albuterol (PROVENTIL HFA;VENTOLIN HFA) 108 (90 BASE) MCG/ACT inhaler Inhale 2 puffs into the lungs every 4 (four) hours as needed for wheezing or shortness of breath.    [provider]  amitriptyline (ELAVIL) 25 MG tablet Take 75 mg by mouth at bedtime.    [provider]  amLODipine (NORVASC) 10 MG tablet Take 1 tablet (10 mg total) by mouth daily. 08/03/18   Minus Breeding, MD  amoxicillin (AMOXIL) 500 MG capsule Take 1 capsule (500 mg total) by mouth 3 (three) times daily. 06/06/21   Fransico Meadow, PA-C  atenolol (TENORMIN) 50 MG tablet Take 50 mg by mouth daily.    [provider]  benzonatate (TESSALON PERLES) 100 MG capsule Take 1 capsule (100 mg total) by mouth every 6 (six) hours as needed for cough. 06/06/21 06/06/22  Fransico Meadow, PA-C  cholecalciferol (VITAMIN D) 25 MCG (1000 UNIT) tablet Take 2,000 Units by mouth daily. 01/16/19   [provider]   diclofenac Sodium (VOLTAREN) 1 % GEL Apply 2 g topically daily as needed (pain). 03/02/19   [provider]  fluticasone (FLONASE) 50 MCG/ACT nasal spray Place 2 sprays into both nostrils daily as needed for allergies. 03/02/19   [provider]  gabapentin (NEURONTIN) 400 MG capsule Take 400 mg by mouth at bedtime as needed (pain).    [provider]  hydrochlorothiazide (HYDRODIURIL) 25 MG tablet Take 12.5 tablets by mouth daily as needed (fluid). 08/12/14   [provider]  hydroxypropyl methylcellulose / hypromellose (ISOPTO TEARS / GONIOVISC) 2.5 % ophthalmic solution Place 1 drop into both eyes 3 (three) times daily as needed for dry eyes.    [provider]  loratadine (CLARITIN) 10 MG tablet Take 10 mg by mouth daily as needed for allergies.    [provider]  losartan (COZAAR) 50 MG tablet Take 50 mg by mouth daily.    [provider]  methocarbamol (ROBAXIN) 500 MG tablet Take 1 tablet (500 mg total) by mouth every 8 (eight) hours as needed for muscle spasms. 07/31/20   Magnant, Charles L, PA-C  mometasone (ASMANEX) 220 MCG/INH inhaler Inhale 2 puffs into the lungs at bedtime.    [provider]  omeprazole (PRILOSEC) 20 MG capsule Take 20 mg by mouth 2 (two) times daily. 08/12/14   [provider]    Family History Family History  Problem Relation Age of Onset   Asthma Father    Heart attack Mother 66       Died with MI age 36   Arthritis Mother    Diabetes Mother    Stroke Sister    Breast cancer Sister    Asthma Daughter     Social History Social History   Tobacco Use   Smoking status: Never   Smokeless tobacco: Never  Vaping Use   Vaping Use: Never used  Substance Use Topics   Alcohol use: No   Drug use: No     Allergies   Fexofenadine, Ibuprofen, Lisinopril, Acetaminophen, Aspirin, and Spironolactone   Review of Systems Review of Systems  All other systems reviewed and are  negative.    Physical Exam Triage Vital Signs ED Triage Vitals  Enc Vitals Group     BP 07/20/21 0820 137/85     Pulse Rate 07/20/21 0820 76     Resp 07/20/21 0820 17     Temp 07/20/21 0820 97.9 F (36.6 C)     Temp Source 07/20/21 0820 Oral     SpO2 07/20/21 0820 97 %     Weight --      Height --      Head Circumference --      Peak Flow --  Pain Score 07/20/21 0819 2     Pain Loc --      Pain Edu? --      Excl. in GC? --    No data found.  Updated Vital Signs BP 137/85 (BP Location: Left Arm)   Pulse 76   Temp 97.9 F (36.6 C) (Oral)   Resp 17   LMP 01/13/2015   SpO2 97%   Visual Acuity Right Eye Distance:   Left Eye Distance:   Bilateral Distance:    Right Eye Near:   Left Eye Near:    Bilateral Near:     Physical Exam Vitals and nursing note reviewed.  Constitutional:      General: She is not in acute distress.    Appearance: She is well-developed.  HENT:     Head: Normocephalic and atraumatic.  Eyes:     Conjunctiva/sclera: Conjunctivae normal.  Cardiovascular:     Rate and Rhythm: Normal rate and regular rhythm.     Heart sounds: No murmur heard. Pulmonary:     Effort: Pulmonary effort is normal. No respiratory distress.     Breath sounds: Normal breath sounds.  Abdominal:     Palpations: Abdomen is soft.     Tenderness: There is no abdominal tenderness.  Musculoskeletal:        General: No swelling.     Cervical back: Neck supple.  Skin:    General: Skin is warm and dry.     Capillary Refill: Capillary refill takes less than 2 seconds.  Neurological:     Mental Status: She is alert.  Psychiatric:        Mood and Affect: Mood normal.      UC Treatments / Results  Labs (all labs ordered are listed, but only abnormal results are displayed) Labs Reviewed - No data to display  EKG   Radiology No results found.  Procedures Procedures (including critical care time)  Medications Ordered in UC Medications - No data to  display  Initial Impression / Assessment and Plan / UC Course  I have reviewed the triage vital signs and the nursing notes.  Pertinent labs & imaging results that were available during my care of the patient were reviewed by me and considered in my medical decision making (see chart for details).     Patient is given a prescription for Cipro and Flagyl she is advised to follow-up with her primary care physician and her GI physician as scheduled she is advised to go to the emergency department if fever chills or worsening symptoms Final Clinical Impressions(s) / UC Diagnoses   Final diagnoses:  Diverticulitis of colon   Discharge Instructions   None    ED Prescriptions     Medication Sig Dispense Auth. Provider   ciprofloxacin (CIPRO) 500 MG tablet Take 1 tablet (500 mg total) by mouth 2 (two) times daily for 10 days. 20 tablet Abhimanyu Cruces K, PA-C   metroNIDAZOLE (FLAGYL) 500 MG tablet Take 1 tablet (500 mg total) by mouth 2 (two) times daily for 10 days. 21 tablet Dezarae Mcclaran K, PA-C   ondansetron (ZOFRAN) 4 MG tablet Take 1 tablet (4 mg total) by mouth daily as needed for nausea or vomiting. 30 tablet Elson Areas, [place]      PDMP not reviewed this encounter. An After Visit Summary was printed and given to the patient.    Elson Areas, [place] 07/21/21 1824

## 2021-07-30 DIAGNOSIS — N281 Cyst of kidney, acquired: Secondary | ICD-10-CM | POA: Diagnosis not present

## 2021-08-02 DIAGNOSIS — M2550 Pain in unspecified joint: Secondary | ICD-10-CM | POA: Diagnosis not present

## 2021-08-02 DIAGNOSIS — G8929 Other chronic pain: Secondary | ICD-10-CM | POA: Diagnosis not present

## 2021-08-02 DIAGNOSIS — R7303 Prediabetes: Secondary | ICD-10-CM | POA: Diagnosis not present

## 2021-08-02 DIAGNOSIS — M791 Myalgia, unspecified site: Secondary | ICD-10-CM | POA: Diagnosis not present

## 2021-08-07 DIAGNOSIS — Z823 Family history of stroke: Secondary | ICD-10-CM | POA: Diagnosis not present

## 2021-08-07 DIAGNOSIS — M2012 Hallux valgus (acquired), left foot: Secondary | ICD-10-CM | POA: Diagnosis not present

## 2021-08-07 DIAGNOSIS — L600 Ingrowing nail: Secondary | ICD-10-CM | POA: Diagnosis not present

## 2021-08-07 DIAGNOSIS — E119 Type 2 diabetes mellitus without complications: Secondary | ICD-10-CM | POA: Diagnosis not present

## 2021-08-07 DIAGNOSIS — I10 Essential (primary) hypertension: Secondary | ICD-10-CM | POA: Diagnosis not present

## 2021-08-11 ENCOUNTER — Ambulatory Visit: Payer: Self-pay

## 2021-08-11 ENCOUNTER — Ambulatory Visit: Payer: Federal, State, Local not specified - PPO | Admitting: Physical Medicine and Rehabilitation

## 2021-08-11 ENCOUNTER — Encounter: Payer: Self-pay | Admitting: Physical Medicine and Rehabilitation

## 2021-08-11 VITALS — BP 146/83 | HR 79

## 2021-08-11 DIAGNOSIS — M47816 Spondylosis without myelopathy or radiculopathy, lumbar region: Secondary | ICD-10-CM | POA: Diagnosis not present

## 2021-08-11 MED ORDER — METHYLPREDNISOLONE ACETATE 80 MG/ML IJ SUSP
80.0000 mg | Freq: Once | INTRAMUSCULAR | Status: AC
  Administered 2021-08-11: 80 mg

## 2021-08-11 NOTE — Patient Instructions (Signed)

## 2021-08-11 NOTE — Progress Notes (Signed)
Bethany Kemp - 61 y.o. female MRN [id number]  Date of birth: Jul 01, 1960  Office Visit Note: Visit Date: 08/11/2021 PCP: Rollene Rotunda, MD Referred by: Rollene Rotunda, MD  Subjective: Chief Complaint  Patient presents with   Lower Back - Pain   HPI:  Bethany Kemp is a 61 y.o. female who comes in today at the request of Dr. Burnard Bunting for planned Bilateral  L4-5 Lumbar facet/medial branch block with fluoroscopic guidance.  The patient has failed conservative care including home exercise, medications, time and activity modification.  This injection will be diagnostic and hopefully therapeutic.  Please see requesting physician notes for further details and justification.  Exam has shown concordant pain with facet joint loading.   ROS Otherwise per HPI.  Assessment & Plan: Visit Diagnoses:    ICD-10-CM   1. Spondylosis without myelopathy or radiculopathy, lumbar region  M47.816 XR C-ARM NO REPORT    Facet Injection    methylPREDNISolone acetate (DEPO-MEDROL) injection 80 mg      Plan: No additional findings.   Meds & Orders:  Meds ordered this encounter  Medications   methylPREDNISolone acetate (DEPO-MEDROL) injection 80 mg    Orders Placed This Encounter  Procedures   Facet Injection   XR C-ARM NO REPORT    Follow-up: Return for visit to requesting provider as needed.   Procedures: No procedures performed  Lumbar Facet Joint Intra-Articular Injection(s) with Fluoroscopic Guidance  Patient: Bethany Kemp      Date of Birth: 1961/01/03 MRN: [id number] PCP: Rollene Rotunda, MD      Visit Date: 08/11/2021   Universal Protocol:    Date/Time: 08/11/2021  Consent Given By: the patient  Position: PRONE   Additional Comments: Vital signs were monitored before and after the procedure. Patient was prepped and draped in the usual sterile fashion. The correct patient, procedure, and site was verified.   Injection Procedure Details:  Procedure Site One Meds  Administered:  Meds ordered this encounter  Medications   methylPREDNISolone acetate (DEPO-MEDROL) injection 80 mg     Laterality: Bilateral  Location/Site:  L4-L5  Needle size: 22 guage  Needle type: Spinal  Needle Placement: Articular  Findings:  -Comments: Excellent flow of contrast producing a partial arthrogram.  Procedure Details: The fluoroscope beam is vertically oriented in AP, and the inferior recess is visualized beneath the lower pole of the inferior apophyseal process, which represents the target point for needle insertion. When direct visualization is difficult the target point is located at the medial projection of the vertebral pedicle. The region overlying each aforementioned target is locally anesthetized with a 1 to 2 ml. volume of 1% Lidocaine without Epinephrine.   The spinal needle was inserted into each of the above mentioned facet joints using biplanar fluoroscopic guidance. A 0.25 to 0.5 ml. volume of Isovue-250 was injected and a partial facet joint arthrogram was obtained. A single spot film was obtained of the resulting arthrogram.    One to 1.25 ml of the steroid/anesthetic solution was then injected into each of the facet joints noted above.   Additional Comments:  The patient tolerated the procedure well Dressing: 2 x 2 sterile gauze and Band-Aid    Post-procedure details: Patient was observed during the procedure. Post-procedure instructions were reviewed.  Patient left the clinic in stable condition.    Clinical History: MRI LUMBAR SPINE WITHOUT CONTRAST   TECHNIQUE: Multiplanar, multisequence MR imaging of the lumbar spine was performed. No intravenous contrast was administered.  COMPARISON:  X-ray 06/11/2021   FINDINGS: Segmentation:  Standard.   Alignment:  3 mm grade 1 anterolisthesis of L4 on L5.   Vertebrae: No fracture, evidence of discitis, or bone lesion. Discogenic endplate marrow changes at L4-5 and L5-S1.   Conus  medullaris and cauda equina: Conus extends to the L1 level. Conus and cauda equina appear normal.   Paraspinal and other soft tissues: Negative.   Disc levels:   T12-L1: Unremarkable.   L1-L2: Unremarkable.   L2-L3: Unremarkable.   L3-L4: Minimal annular disc bulge. Mild bilateral facet hypertrophy. No foraminal or canal stenosis.   L4-L5: Anterolisthesis with disc uncovering and diffuse disc bulge. Mild-moderate bilateral facet arthropathy. Moderate left and mild right foraminal stenosis. No canal stenosis.   L5-S1: Shallow right paracentral disc protrusion. No significant facet arthropathy. Mild bilateral foraminal stenosis. No canal stenosis.   IMPRESSION: 1. Mild lower lumbar spondylosis, most pronounced at L4-5 where there is moderate left and mild right foraminal stenosis. 2. Mild bilateral foraminal stenosis at L5-S1. 3. No canal stenosis at any level.     Electronically Signed   By: Duanne Guess D.O.   On: 06/22/2021 09:56     Objective:  VS:  HT:    WT:   BMI:     BP:(!) 146/83  HR:79bpm  TEMP: ( )  RESP:  Physical Exam Vitals and nursing note reviewed.  Constitutional:      General: She is not in acute distress.    Appearance: Normal appearance. She is obese. She is not ill-appearing.  HENT:     Head: Normocephalic and atraumatic.     Right Ear: External ear normal.     Left Ear: External ear normal.  Eyes:     Extraocular Movements: Extraocular movements intact.  Cardiovascular:     Rate and Rhythm: Normal rate.     Pulses: Normal pulses.  Pulmonary:     Effort: Pulmonary effort is normal. No respiratory distress.  Abdominal:     General: There is no distension.     Palpations: Abdomen is soft.  Musculoskeletal:        General: Tenderness present.     Cervical back: Neck supple.     Right lower leg: No edema.     Left lower leg: No edema.     Comments: Patient has good distal strength with no pain over the greater trochanters.  No  clonus or focal weakness.  Skin:    Findings: No erythema, lesion or rash.  Neurological:     General: No focal deficit present.     Mental Status: She is alert and oriented to person, place, and time.     Sensory: No sensory deficit.     Motor: No weakness or abnormal muscle tone.     Coordination: Coordination normal.  Psychiatric:        Mood and Affect: Mood normal.        Behavior: Behavior normal.      Imaging: XR C-ARM NO REPORT  Result Date: 08/11/2021 Please see Notes tab for imaging impression.

## 2021-08-11 NOTE — Progress Notes (Signed)
Pt state lower back pain that travels to her left buttocks and sometime her legs. Pt state walking and standing makes the pain worse. Pt state she takes pain meds to help ease her pain.  Numeric Pain Rating Scale and Functional Assessment Average Pain 5   In the last MONTH (on 0-10 scale) has pain interfered with the following?  1. General activity like being  able to carry out your everyday physical activities such as walking, climbing stairs, carrying groceries, or moving a chair?  Rating(10)   +Driver, -BT, -Dye Allergies.

## 2021-08-11 NOTE — Procedures (Signed)
Lumbar Facet Joint Intra-Articular Injection(s) with Fluoroscopic Guidance  Patient: Bethany Kemp      Date of Birth: [birth year] MRN: [id number] PCP: Rollene Rotunda, MD      Visit Date: 08/11/2021   Universal Protocol:    Date/Time: 08/11/2021  Consent Given By: the patient  Position: PRONE   Additional Comments: Vital signs were monitored before and after the procedure. Patient was prepped and draped in the usual sterile fashion. The correct patient, procedure, and site was verified.   Injection Procedure Details:  Procedure Site One Meds Administered:  Meds ordered this encounter  Medications   methylPREDNISolone acetate (DEPO-MEDROL) injection 80 mg     Laterality: Bilateral  Location/Site:  L4-L5  Needle size: 22 guage  Needle type: Spinal  Needle Placement: Articular  Findings:  -Comments: Excellent flow of contrast producing a partial arthrogram.  Procedure Details: The fluoroscope beam is vertically oriented in AP, and the inferior recess is visualized beneath the lower pole of the inferior apophyseal process, which represents the target point for needle insertion. When direct visualization is difficult the target point is located at the medial projection of the vertebral pedicle. The region overlying each aforementioned target is locally anesthetized with a 1 to 2 ml. volume of 1% Lidocaine without Epinephrine.   The spinal needle was inserted into each of the above mentioned facet joints using biplanar fluoroscopic guidance. A 0.25 to 0.5 ml. volume of Isovue-250 was injected and a partial facet joint arthrogram was obtained. A single spot film was obtained of the resulting arthrogram.    One to 1.25 ml of the steroid/anesthetic solution was then injected into each of the facet joints noted above.   Additional Comments:  The patient tolerated the procedure well Dressing: 2 x 2 sterile gauze and Band-Aid    Post-procedure details: Patient was observed  during the procedure. Post-procedure instructions were reviewed.  Patient left the clinic in stable condition.

## 2021-09-12 DIAGNOSIS — Z7984 Long term (current) use of oral hypoglycemic drugs: Secondary | ICD-10-CM | POA: Diagnosis not present

## 2021-09-12 DIAGNOSIS — Z135 Encounter for screening for eye and ear disorders: Secondary | ICD-10-CM | POA: Diagnosis not present

## 2021-09-12 DIAGNOSIS — E119 Type 2 diabetes mellitus without complications: Secondary | ICD-10-CM | POA: Diagnosis not present

## 2021-09-16 DIAGNOSIS — E119 Type 2 diabetes mellitus without complications: Secondary | ICD-10-CM | POA: Diagnosis not present

## 2021-09-16 DIAGNOSIS — Z135 Encounter for screening for eye and ear disorders: Secondary | ICD-10-CM | POA: Diagnosis not present

## 2021-10-13 DIAGNOSIS — E119 Type 2 diabetes mellitus without complications: Secondary | ICD-10-CM | POA: Diagnosis not present

## 2021-10-13 DIAGNOSIS — B351 Tinea unguium: Secondary | ICD-10-CM | POA: Diagnosis not present

## 2021-10-13 DIAGNOSIS — L602 Onychogryphosis: Secondary | ICD-10-CM | POA: Diagnosis not present

## 2021-10-13 DIAGNOSIS — L600 Ingrowing nail: Secondary | ICD-10-CM | POA: Diagnosis not present

## 2021-10-29 DIAGNOSIS — N6310 Unspecified lump in the right breast, unspecified quadrant: Secondary | ICD-10-CM | POA: Diagnosis not present

## 2022-01-29 DIAGNOSIS — I10 Essential (primary) hypertension: Secondary | ICD-10-CM | POA: Diagnosis not present

## 2022-01-29 DIAGNOSIS — L600 Ingrowing nail: Secondary | ICD-10-CM | POA: Diagnosis not present

## 2022-01-29 DIAGNOSIS — E119 Type 2 diabetes mellitus without complications: Secondary | ICD-10-CM | POA: Diagnosis not present

## 2022-01-29 DIAGNOSIS — Z823 Family history of stroke: Secondary | ICD-10-CM | POA: Diagnosis not present

## 2022-02-03 DIAGNOSIS — E785 Hyperlipidemia, unspecified: Secondary | ICD-10-CM | POA: Diagnosis not present

## 2022-02-03 DIAGNOSIS — E119 Type 2 diabetes mellitus without complications: Secondary | ICD-10-CM | POA: Diagnosis not present

## 2022-02-19 NOTE — Progress Notes (Signed)
Cardiology Office Note   Date:  02/20/2022   ID:  Bethany Kemp, Bethany Kemp [birth year], MRN [id number]  PCP:  Minus Breeding, MD  Cardiologist:   Minus Breeding, MD    Chief Complaint  Patient presents with   Palpitations     History of Present Illness: Bethany Kemp is a 62 y.o. female who who presents for patient presents for evaluation of palpitations.   I saw her in 2022.   She has done well.  She really does not feel her palpitations anymore.  She is line dancing sometimes.  She is doing a little gym work. The patient denies any new symptoms such as chest discomfort, neck or arm discomfort. There has been no new shortness of breath, PND or orthopnea. There have been no reported palpitations, presyncope or syncope.    Past Medical History:  Diagnosis Date   Anemia    Arthritis    Asthma    Complication of anesthesia    Diverticulitis    DVT (deep venous thrombosis) ([facility])    after knee surgery   Family history of adverse reaction to anesthesia    sister had a hard time waking up   Gastritis    1980's was hospitalized   GERD (gastroesophageal reflux disease)    Heart murmur    History of hiatal hernia    Hypertension    Obesities, morbid (HCC)    PONV (postoperative nausea and vomiting)    Pre-diabetes    Sleep apnea    BiPap    Past Surgical History:  Procedure Laterality Date   BREAST REDUCTION SURGERY     BREAST SURGERY     BUNIONECTOMY  01/13/1991   bilateral   CARDIAC CATHETERIZATION N/A 07/30/2014   Procedure: Left Heart Cath and Coronary Angiography;  Surgeon: Sherren Mocha, MD;  Location: [facility];  Service: Cardiovascular;  Laterality: N/A;   CHOLECYSTECTOMY  01/13/2003   HERNIA REPAIR  01/13/1980   KNEE SURGERY  01/12/2001   Righ knee -- chondal implant   TOTAL KNEE ARTHROPLASTY Left 07/30/2020   Procedure: LEFT TOTAL KNEE ARTHROPLASTY;  Surgeon: Meredith Pel, MD;  Location: [facility];  Service: Orthopedics;  Laterality: Left;    Family History  Problem Relation Age of Onset   Asthma Father    Heart attack Mother 74       Died with MI age 26   Arthritis Mother    Diabetes Mother    Stroke Sister    Breast cancer Sister    Asthma Daughter    Social History   Socioeconomic History   Marital status: Single    Spouse name: Not on file   Number of children: 1   Years of education: Not on file   Highest education level: Not on file  Occupational History   Not on file  Tobacco Use   Smoking status: Never   Smokeless tobacco: Never  Vaping Use   Vaping Use: Never used  Substance and Sexual Activity   Alcohol use: No   Drug use: No   Sexual activity: Not on file  Other Topics Concern   Not on file  Social History Narrative   Lives alone.    Social Determinants of Health   Financial Resource Strain: Not on file  Food Insecurity: Not on file  Transportation Needs: Not on file  Physical Activity: Not on file  Stress: Not on file  Social Connections: Not on file  Intimate Partner Violence: Not  on file     Current Outpatient Medications  Medication Sig Dispense Refill   albuterol (PROVENTIL HFA;VENTOLIN HFA) 108 (90 BASE) MCG/ACT inhaler Inhale 2 puffs into the lungs every 4 (four) hours as needed for wheezing or shortness of breath.     amitriptyline (ELAVIL) 25 MG tablet Take 75 mg by mouth at bedtime.     amLODipine (NORVASC) 10 MG tablet Take 1 tablet (10 mg total) by mouth daily. 90 tablet 3   atenolol (TENORMIN) 50 MG tablet Take 50 mg by mouth daily.     cholecalciferol (VITAMIN D) 25 MCG (1000 UNIT) tablet Take 2,000 Units by mouth daily.     diclofenac Sodium (VOLTAREN) 1 % GEL Apply 2 g topically daily as needed (pain).     fluticasone (FLONASE) 50 MCG/ACT nasal spray Place 2 sprays into both nostrils daily as needed for allergies.     gabapentin (NEURONTIN) 400 MG capsule Take 400 mg by mouth at bedtime as needed (pain).     hydrochlorothiazide (HYDRODIURIL) 25 MG tablet Take 12.5  tablets by mouth daily as needed (fluid).     hydroxypropyl methylcellulose / hypromellose (ISOPTO TEARS / GONIOVISC) 2.5 % ophthalmic solution Place 1 drop into both eyes 3 (three) times daily as needed for dry eyes.     loratadine (CLARITIN) 10 MG tablet Take 10 mg by mouth daily as needed for allergies.     losartan (COZAAR) 50 MG tablet Take 50 mg by mouth daily.     methocarbamol (ROBAXIN) 500 MG tablet Take 1 tablet (500 mg total) by mouth every 8 (eight) hours as needed for muscle spasms. 30 tablet 0   mometasone (ASMANEX) 220 MCG/INH inhaler Inhale 2 puffs into the lungs at bedtime.     omeprazole (PRILOSEC) 20 MG capsule Take 20 mg by mouth 2 (two) times daily.     No current facility-administered medications for this visit.    Allergies:   Fexofenadine, Ibuprofen, Lisinopril, Acetaminophen, Aspirin, and Spironolactone    ROS:  Please see the history of present illness.   Otherwise, review of systems are positive for none.   All other systems are reviewed and negative.    PHYSICAL EXAM: VS:  BP 132/74   Pulse 77   Ht 5' 4"$  (1.626 m)   Wt 221 lb 12.8 oz (100.6 kg)   LMP 01/13/2015   SpO2 100%   BMI 38.07 kg/m  , BMI Body mass index is 38.07 kg/m. GENERAL:  Well appearing NECK:  No jugular venous distention, waveform within normal limits, carotid upstroke brisk and symmetric, no bruits, no thyromegaly LUNGS:  Clear to auscultation bilaterally CHEST:  Unremarkable HEART:  PMI not displaced or sustained,S1 and S2 within normal limits, no S3, no S4, no clicks, no rubs, 2 out of 6 brief left upper sternal border systolic murmur nonradiating and not increasing with the strain phase of Valsalva, no diastolic murmurs ABD:  Flat, positive bowel sounds normal in frequency in pitch, no bruits, no rebound, no guarding, no midline pulsatile mass, no hepatomegaly, no splenomegaly EXT:  2 plus pulses throughout, no edema, no cyanosis no clubbing  EKG:  EKG is  ordered today. The ekg  ordered today demonstrates sinus rhythm, rate 77, axis within normal limits, intervals within normal limits, no acute ST-T wave changes.   Recent Labs: No results found for requested labs within last 365 days.    Lipid Panel No results found for: "CHOL", "TRIG", "HDL", "CHOLHDL", "VLDL", "LDLCALC", "LDLDIRECT"    Wt Readings  from Last 3 Encounters:  02/20/22 221 lb 12.8 oz (100.6 kg)  06/11/21 218 lb 3.2 oz (99 kg)  08/30/20 217 lb 6.4 oz (98.6 kg)      Other studies Reviewed: Additional studies/ records that were reviewed today include:  None Review of the above records demonstrates:NA   ASSESSMENT AND PLAN:   PALPITATIONS: Particularly bothered by these.  No change in therapy.  HTN:  The blood pressure is at target.  No change in therapy.   MURMUR:     She had no significant abnormalities in 2020 on echo.  I would not suspect that this is changed.  No further imaging.  She can come back for further evaluation in the future if there is any change in the character or quality of the murmur.    Current medicines are reviewed at length with the patient today.  The patient does not have concerns regarding medicines.  The following changes have been made: None  Labs/ tests ordered today include: None  Orders Placed This Encounter  Procedures   EKG 12-Lead    Disposition:   FU with me as needed   Signed, Minus Breeding, MD  02/20/2022 9:28 AM    [place]

## 2022-02-20 ENCOUNTER — Ambulatory Visit: Payer: Federal, State, Local not specified - PPO | Attending: Cardiology | Admitting: Cardiology

## 2022-02-20 ENCOUNTER — Encounter: Payer: Self-pay | Admitting: Cardiology

## 2022-02-20 VITALS — BP 132/74 | HR 77 | Ht 64.0 in | Wt 221.8 lb

## 2022-02-20 DIAGNOSIS — R011 Cardiac murmur, unspecified: Secondary | ICD-10-CM

## 2022-02-20 DIAGNOSIS — R002 Palpitations: Secondary | ICD-10-CM | POA: Diagnosis not present

## 2022-02-20 DIAGNOSIS — I10 Essential (primary) hypertension: Secondary | ICD-10-CM

## 2022-02-20 NOTE — Patient Instructions (Signed)
Medication Instructions:  Your physician recommends that you continue on your current medications as directed. Please refer to the Current Medication list given to you today.  *If you need a refill on your cardiac medications before your next appointment, please call your pharmacy*   Lab Work: None   Testing/Procedures: None   Follow-Up: At [facility], you and your health needs are our priority.  As part of our continuing mission to provide you with exceptional heart care, we have created designated Provider Care Teams.  These Care Teams include your primary Cardiologist (physician) and Advanced Practice Providers (APPs -  Physician Assistants and Nurse Practitioners) who all work together to provide you with the care you need, when you need it.  Your next appointment:    As needed  Provider:   Minus Breeding, MD

## 2022-03-12 DIAGNOSIS — H25013 Cortical age-related cataract, bilateral: Secondary | ICD-10-CM | POA: Diagnosis not present

## 2022-03-12 DIAGNOSIS — H5213 Myopia, bilateral: Secondary | ICD-10-CM | POA: Diagnosis not present

## 2022-03-12 DIAGNOSIS — H52209 Unspecified astigmatism, unspecified eye: Secondary | ICD-10-CM | POA: Diagnosis not present

## 2022-03-12 DIAGNOSIS — H04129 Dry eye syndrome of unspecified lacrimal gland: Secondary | ICD-10-CM | POA: Diagnosis not present

## 2022-04-14 ENCOUNTER — Ambulatory Visit: Payer: Federal, State, Local not specified - PPO | Admitting: Cardiology

## 2022-05-06 ENCOUNTER — Ambulatory Visit: Payer: Federal, State, Local not specified - PPO | Admitting: Orthopedic Surgery

## 2022-05-06 ENCOUNTER — Encounter: Payer: Self-pay | Admitting: Orthopedic Surgery

## 2022-05-06 DIAGNOSIS — M47816 Spondylosis without myelopathy or radiculopathy, lumbar region: Secondary | ICD-10-CM

## 2022-05-06 DIAGNOSIS — M48061 Spinal stenosis, lumbar region without neurogenic claudication: Secondary | ICD-10-CM | POA: Diagnosis not present

## 2022-05-06 MED ORDER — METHOCARBAMOL 500 MG PO TABS: 500.0000 mg | ORAL_TABLET | Freq: Three times a day (TID) | ORAL | 0 refills | Status: AC | PRN

## 2022-05-06 MED ORDER — NABUMETONE 500 MG PO TABS: 500.0000 mg | ORAL_TABLET | Freq: Every day | ORAL | 0 refills | Status: DC

## 2022-05-06 MED ORDER — AMOXICILLIN 500 MG PO TABS: ORAL_TABLET | ORAL | 0 refills | Status: DC

## 2022-05-06 NOTE — Progress Notes (Signed)
Office Visit Note   Patient: Bethany Kemp           Date of Birth: 10-21-1960           MRN: [id number] Visit Date: 05/06/2022 Requested by: Rollene Rotunda, MD [address] [place],  [place] [address] PCP: Rollene Rotunda, MD  Subjective: Chief Complaint  Patient presents with   Left Leg - Pain   Right Knee - Follow-up    HPI: Bethany Kemp is a 62 y.o. female who presents to the office reporting right knee pain as well as left radicular pain involving the buttock and gluteal region.  She like to get the right knee checked.  Has known history of arthritis in the patellofemoral joint on that side.  Has history of left total knee replacement which is doing well.  She describes 2 to 3 weeks of left buttock and thigh pain.  The pain wakes her from sleep.  Radiates down but not below the knee.  Denies any groin pain.  She had an MRI scan 623 which showed mild to moderate facet arthritis left worse than right at L4-5.  Did have a back injection done elsewhere.  Tried gabapentin and Mobic which is not helping..                ROS: All systems reviewed are negative as they relate to the chief complaint within the history of present illness.  Patient denies fevers or chills.  Assessment & Plan: Visit Diagnoses:  1. Spondylosis without myelopathy or radiculopathy, lumbar region   2. Spinal stenosis of lumbar region, unspecified whether neurogenic claudication present     Plan: Impression is no effusion of the right knee with progressive arthritis with some limitation of motion.  Regarding the low back I think she has no hip arthritis on exam but does likely have some foraminal stenosis which has worsened since her MRI scan from last year.  We will try Relafen and Robaxin.  If that is no better in a couple of weeks she will let me know and we will get her set up for a back injection with Dr. Alvester Morin.  Follow-Up Instructions: No follow-ups on file.   Orders:  No orders of the  defined types were placed in this encounter.  Meds ordered this encounter  Medications   nabumetone (RELAFEN) 500 MG tablet    Sig: Take 1 tablet (500 mg total) by mouth daily.    Dispense:  30 tablet    Refill:  0   methocarbamol (ROBAXIN) 500 MG tablet    Sig: Take 1 tablet (500 mg total) by mouth every 8 (eight) hours as needed for muscle spasms.    Dispense:  30 tablet    Refill:  0   amoxicillin (AMOXIL) 500 MG tablet    Sig: 2g 1 hour prior to dental procedure    Dispense:  10 tablet    Refill:  0      Procedures: No procedures performed   Clinical Data: No additional findings.  Objective: Vital Signs: LMP 01/13/2015   Physical Exam:  Constitutional: Patient appears well-developed HEENT:  Head: Normocephalic Eyes:EOM are normal Neck: Normal range of motion Cardiovascular: Normal rate Pulmonary/chest: Effort normal Neurologic: Patient is alert Skin: Skin is warm Psychiatric: Patient has normal mood and affect  Ortho Exam: Ortho exam demonstrates full active and passive range of motion of the left knee.  Right knee has range of motion of about 8 to 105 degrees.  Patellofemoral crepitus is present but there is no effusion.  No groin pain with internal/external rotation of either leg.  She has no paresthesias L1 S1 bilaterally.  No real trochanteric tenderness is present.  Does have some gluteal tenderness on the left-hand side compared to the right.  Specialty Comments:  MRI LUMBAR SPINE WITHOUT CONTRAST   TECHNIQUE: Multiplanar, multisequence MR imaging of the lumbar spine was performed. No intravenous contrast was administered.   COMPARISON:  X-ray 06/11/2021   FINDINGS: Segmentation:  Standard.   Alignment:  3 mm grade 1 anterolisthesis of L4 on L5.   Vertebrae: No fracture, evidence of discitis, or bone lesion. Discogenic endplate marrow changes at L4-5 and L5-S1.   Conus medullaris and cauda equina: Conus extends to the L1 level. Conus and cauda  equina appear normal.   Paraspinal and other soft tissues: Negative.   Disc levels:   T12-L1: Unremarkable.   L1-L2: Unremarkable.   L2-L3: Unremarkable.   L3-L4: Minimal annular disc bulge. Mild bilateral facet hypertrophy. No foraminal or canal stenosis.   L4-L5: Anterolisthesis with disc uncovering and diffuse disc bulge. Mild-moderate bilateral facet arthropathy. Moderate left and mild right foraminal stenosis. No canal stenosis.   L5-S1: Shallow right paracentral disc protrusion. No significant facet arthropathy. Mild bilateral foraminal stenosis. No canal stenosis.   IMPRESSION: 1. Mild lower lumbar spondylosis, most pronounced at L4-5 where there is moderate left and mild right foraminal stenosis. 2. Mild bilateral foraminal stenosis at L5-S1. 3. No canal stenosis at any level.     Electronically Signed   By: Duanne Guess D.O.   On: 06/22/2021 09:56  Imaging: No results found.   PMFS History: Patient Active Problem List   Diagnosis Date Noted   Overweight 08/29/2020   Essential hypertension 08/29/2020   Murmur 08/29/2020   Arthritis of left knee    S/P total knee arthroplasty, left 07/30/2020   Unilateral primary osteoarthritis, left knee 05/07/2016   Abnormal stress ECG with treadmill 07/30/2014   Palpitation 06/22/2014   Dyspnea 06/22/2014   Past Medical History:  Diagnosis Date   Anemia    Arthritis    Asthma    Complication of anesthesia    Diverticulitis    DVT (deep venous thrombosis) ([facility])    after knee surgery   Family history of adverse reaction to anesthesia    sister had a hard time waking up   Gastritis    1980's was hospitalized   GERD (gastroesophageal reflux disease)    Heart murmur    History of hiatal hernia    Hypertension    Obesities, morbid ([facility])    PONV (postoperative nausea and vomiting)    Pre-diabetes    Sleep apnea    BiPap    Family History  Problem Relation Age of Onset   Asthma Father    Heart attack  Mother 27       Died with MI age 64   Arthritis Mother    Diabetes Mother    Stroke Sister    Breast cancer Sister    Asthma Daughter     Past Surgical History:  Procedure Laterality Date   BREAST REDUCTION SURGERY     BREAST SURGERY     BUNIONECTOMY  01/13/1991   bilateral   CARDIAC CATHETERIZATION N/A 07/30/2014   Procedure: Left Heart Cath and Coronary Angiography;  Surgeon: Tonny Bollman, MD;  Location: [facility];  Service: Cardiovascular;  Laterality: N/A;   CHOLECYSTECTOMY  01/13/2003   HERNIA REPAIR  01/13/1980   KNEE SURGERY  01/12/2001   Righ knee -- chondal implant   TOTAL KNEE ARTHROPLASTY Left 07/30/2020   Procedure: LEFT TOTAL KNEE ARTHROPLASTY;  Surgeon: [name], MD;  Location: [facility];  Service: Orthopedics;  Laterality: Left;   Social History   Occupational History   Not on file  Tobacco Use   Smoking status: Never   Smokeless tobacco: Never  Vaping Use   Vaping Use: Never used  Substance and Sexual Activity   Alcohol use: No   Drug use: No   Sexual activity: Not on file

## 2022-05-08 ENCOUNTER — Ambulatory Visit
Admission: EM | Admit: 2022-05-08 | Discharge: 2022-05-08 | Disposition: A | Discharge status: Home / Self Care | Payer: Federal, State, Local not specified - PPO | Attending: Family Medicine | Admitting: Family Medicine

## 2022-05-08 DIAGNOSIS — K5732 Diverticulitis of large intestine without perforation or abscess without bleeding: Secondary | ICD-10-CM

## 2022-05-08 MED ORDER — METRONIDAZOLE 500 MG PO TABS: 500.0000 mg | ORAL_TABLET | Freq: Two times a day (BID) | ORAL | 0 refills | Status: AC

## 2022-05-08 MED ORDER — CIPROFLOXACIN HCL 500 MG PO TABS: 500.0000 mg | ORAL_TABLET | Freq: Two times a day (BID) | ORAL | 0 refills | Status: AC

## 2022-05-08 NOTE — ED Provider Notes (Signed)
[name]    CSN: [id number] Arrival date & time: 05/08/22  1135      History   Chief Complaint Chief Complaint  Patient presents with   Abdominal Pain    HPI Bethany Kemp is a 62 y.o. female.    Abdominal Pain  Here for left lower quadrant pain for the last week.  She is also had some nausea.  No vomiting.  She has had some loose stools and her last bowel movement was this morning.  No fever or chills  She does have a history of diverticulitis with similar symptoms.  Past Medical History:  Diagnosis Date   Anemia    Arthritis    Asthma    Complication of anesthesia    Diverticulitis    DVT (deep venous thrombosis) ([facility])    after knee surgery   Family history of adverse reaction to anesthesia    sister had a hard time waking up   Gastritis    1980's was hospitalized   GERD (gastroesophageal reflux disease)    Heart murmur    History of hiatal hernia    Hypertension    Obesities, morbid ([facility]CC)    PONV (postoperative nausea and vomiting)    Pre-diabetes    Sleep apnea    BiPap    Patient Active Problem List   Diagnosis Date Noted   Overweight 08/29/2020   Essential hypertension 08/29/2020   Murmur 08/29/2020   Arthritis of left knee    S/P total knee arthroplasty, left 07/30/2020   Unilateral primary osteoarthritis, left knee 05/07/2016   Abnormal stress ECG with treadmill 07/30/2014   Palpitation 06/22/2014   Dyspnea 06/22/2014    Past Surgical History:  Procedure Laterality Date   BREAST REDUCTION SURGERY     BREAST SURGERY     BUNIONECTOMY  01/13/1991   bilateral   CARDIAC CATHETERIZATION N/A 07/30/2014   Procedure: Left Heart Cath and Coronary Angiography;  Surgeon: Tonny Bollman, MD;  Location: [facility];  Service: Cardiovascular;  Laterality: N/A;   CHOLECYSTECTOMY  01/13/2003   HERNIA REPAIR  01/13/1980   KNEE SURGERY  01/12/2001   Righ knee -- chondal implant   TOTAL KNEE ARTHROPLASTY Left 07/30/2020    Procedure: LEFT TOTAL KNEE ARTHROPLASTY;  Surgeon: [name], MD;  Location: [facility];  Service: Orthopedics;  Laterality: Left;    OB History   No obstetric history on file.      Home Medications    Prior to Admission medications   Medication Sig Start Date End Date Taking? Authorizing Provider  ciprofloxacin (CIPRO) 500 MG tablet Take 1 tablet (500 mg total) by mouth 2 (two) times daily for 7 days. 05/08/22 05/15/22 Yes Walter Grima, Janace Aris, MD  metroNIDAZOLE (FLAGYL) 500 MG tablet Take 1 tablet (500 mg total) by mouth 2 (two) times daily for 7 days. 05/08/22 05/15/22 Yes Eltha Tingley, Janace Aris, MD  albuterol (PROVENTIL HFA;VENTOLIN HFA) 108 (90 BASE) MCG/ACT inhaler Inhale 2 puffs into the lungs every 4 (four) hours as needed for wheezing or shortness of breath.    [provider]  amitriptyline (ELAVIL) 25 MG tablet Take 75 mg by mouth at bedtime.    [provider]  amLODipine (NORVASC) 10 MG tablet Take 1 tablet (10 mg total) by mouth daily. 08/03/18   Rollene Rotunda, MD  amoxicillin (AMOXIL) 500 MG tablet 2g 1 hour prior to dental procedure 05/06/22   [name], MD  atenolol (TENORMIN) 50 MG tablet Take 50  mg by mouth daily.    [provider]  cholecalciferol (VITAMIN D) 25 MCG (1000 UNIT) tablet Take 2,000 Units by mouth daily. 01/16/19   [provider]  diclofenac Sodium (VOLTAREN) 1 % GEL Apply 2 g topically daily as needed (pain). 03/02/19   [provider]  fluticasone (FLONASE) 50 MCG/ACT nasal spray Place 2 sprays into both nostrils daily as needed for allergies. 03/02/19   [provider]  gabapentin (NEURONTIN) 400 MG capsule Take 400 mg by mouth at bedtime as needed (pain).    [provider]  hydrochlorothiazide (HYDRODIURIL) 25 MG tablet Take 12.5 tablets by mouth daily as needed (fluid). 08/12/14   [provider]  hydroxypropyl methylcellulose / hypromellose (ISOPTO TEARS / GONIOVISC) 2.5 %  ophthalmic solution Place 1 drop into both eyes 3 (three) times daily as needed for dry eyes.    [provider]  loratadine (CLARITIN) 10 MG tablet Take 10 mg by mouth daily as needed for allergies.    [provider]  losartan (COZAAR) 50 MG tablet Take 50 mg by mouth daily.    [provider]  methocarbamol (ROBAXIN) 500 MG tablet Take 1 tablet (500 mg total) by mouth every 8 (eight) hours as needed for muscle spasms. 07/31/20   Magnant, Charles L, PA-C  methocarbamol (ROBAXIN) 500 MG tablet Take 1 tablet (500 mg total) by mouth every 8 (eight) hours as needed for muscle spasms. 05/06/22   [name], MD  mometasone [facility] 2) 220 MCG/INH inhaler Inhale 2 puffs into the lungs at bedtime.    [provider]  nabumetone (RELAFEN) 500 MG tablet Take 1 tablet (500 mg total) by mouth daily. 05/06/22   [name], MD  omeprazole (PRILOSEC) 20 MG capsule Take 20 mg by mouth 2 (two) times daily. 08/12/14   [provider]    Family History Family History  Problem Relation Age of Onset   Asthma Father    Heart attack Mother 62       Died with MI age 25   Arthritis Mother    Diabetes Mother    Stroke Sister    Breast cancer Sister    Asthma Daughter     Social History Social History   Tobacco Use   Smoking status: Never   Smokeless tobacco: Never  Vaping Use   Vaping Use: Never used  Substance Use Topics   Alcohol use: No   Drug use: No     Allergies   Fexofenadine, Ibuprofen, Lisinopril, Acetaminophen, Aspirin, and Spironolactone   Review of Systems Review of Systems  Gastrointestinal:  Positive for abdominal pain.     Physical Exam Triage Vital Signs ED Triage Vitals [05/08/22 1200]  Enc Vitals Group     BP (!) 160/93     Pulse Rate 85     Resp 18     Temp 97.9 F (36.6 C)     Temp Source Oral     SpO2 98 %     Weight      Height      Head Circumference      Peak Flow      Pain Score 5     Pain Loc       Pain Edu?      Excl. in GC?    No data found.  Updated Vital Signs BP (!) 160/93 (BP Location: Left Arm)   Pulse 85   Temp 97.9 F (36.6 C) (Oral)   Resp 18  LMP 01/13/2015   SpO2 98%   Visual Acuity Right Eye Distance:   Left Eye Distance:   Bilateral Distance:    Right Eye Near:   Left Eye Near:    Bilateral Near:     Physical Exam Vitals reviewed.  Constitutional:      General: She is not in acute distress.    Appearance: She is not ill-appearing, toxic-appearing or diaphoretic.  HENT:     Mouth/Throat:     Mouth: Mucous membranes are moist.  Eyes:     Extraocular Movements: Extraocular movements intact.     Pupils: Pupils are equal, round, and reactive to light.  Cardiovascular:     Rate and Rhythm: Normal rate and regular rhythm.     Heart sounds: No murmur heard. Pulmonary:     Effort: Pulmonary effort is normal.     Breath sounds: Normal breath sounds.  Abdominal:     General: There is no distension.     Palpations: Abdomen is soft.     Tenderness: There is abdominal tenderness (LLQ). There is no guarding.  Musculoskeletal:     Cervical back: Neck supple.  Lymphadenopathy:     Cervical: No cervical adenopathy.  Skin:    Capillary Refill: Capillary refill takes less than 2 seconds.     Coloration: Skin is not jaundiced or pale.  Neurological:     General: No focal deficit present.     Mental Status: She is alert and oriented to person, place, and time.  Psychiatric:        Behavior: Behavior normal.      UC Treatments / Results  Labs (all labs ordered are listed, but only abnormal results are displayed) Labs Reviewed - No data to display  EKG   Radiology No results found.  Procedures Procedures (including critical care time)  Medications Ordered in UC Medications - No data to display  Initial Impression / Assessment and Plan / UC Course  I have reviewed the triage vital signs and the nursing notes.  Pertinent labs & imaging  results that were available during my care of the patient were reviewed by me and considered in my medical decision making (see chart for details).        Cipro and metronidazole are sent in to treat the diverticulitis.  She already has something for the nausea at home. Final Clinical Impressions(s) / UC Diagnoses   Final diagnoses:  Diverticulitis of colon     Discharge Instructions      Take Cipro 500 mg--1 tablet 2 times daily for 7 days  Take metronidazole 500 mg--1 tablet 2 times daily for 7 days.  Avoid drinking alcohol within 72 hours of taking this medication       ED Prescriptions     Medication Sig Dispense Auth. Provider   ciprofloxacin (CIPRO) 500 MG tablet Take 1 tablet (500 mg total) by mouth 2 (two) times daily for 7 days. 14 tablet Dniya Neuhaus, Janace Aris, MD   metroNIDAZOLE (FLAGYL) 500 MG tablet Take 1 tablet (500 mg total) by mouth 2 (two) times daily for 7 days. 14 tablet Tifani Dack, Janace Aris, MD      PDMP not reviewed this encounter.   Zenia Resides, MD 05/08/22 [phone]

## 2022-05-08 NOTE — ED Triage Notes (Signed)
Pt c/o LLQ pain with diarrhea for the past week. States hx of diverticulitis and this feels the same. States her GI doctor is out of town.

## 2022-05-08 NOTE — Discharge Instructions (Signed)
Take Cipro 500 mg--1 tablet 2 times daily for 7 days  Take metronidazole 500 mg--1 tablet 2 times daily for 7 days.  Avoid drinking alcohol within 72 hours of taking this medication

## 2022-06-01 ENCOUNTER — Ambulatory Visit: Payer: Federal, State, Local not specified - PPO | Admitting: Surgical

## 2022-06-01 ENCOUNTER — Telehealth: Payer: Self-pay | Admitting: Radiology

## 2022-06-01 ENCOUNTER — Encounter: Payer: Self-pay | Admitting: Surgical

## 2022-06-01 DIAGNOSIS — M48061 Spinal stenosis, lumbar region without neurogenic claudication: Secondary | ICD-10-CM

## 2022-06-01 DIAGNOSIS — M1711 Unilateral primary osteoarthritis, right knee: Secondary | ICD-10-CM | POA: Diagnosis not present

## 2022-06-01 MED ORDER — LIDOCAINE HCL 1 % IJ SOLN
5.0000 mL | INTRAMUSCULAR | Status: AC | PRN
Start: 2022-06-01 — End: 2022-06-01
  Administered 2022-06-01: 5 mL

## 2022-06-01 MED ORDER — BUPIVACAINE HCL 0.25 % IJ SOLN
4.0000 mL | INTRAMUSCULAR | Status: AC | PRN
Start: 2022-06-01 — End: 2022-06-01
  Administered 2022-06-01: 4 mL via INTRA_ARTICULAR

## 2022-06-01 MED ORDER — METHYLPREDNISOLONE ACETATE 40 MG/ML IJ SUSP
40.0000 mg | INTRAMUSCULAR | Status: AC | PRN
Start: 2022-06-01 — End: 2022-06-01
  Administered 2022-06-01: 40 mg via INTRA_ARTICULAR

## 2022-06-01 NOTE — Telephone Encounter (Signed)
Please obtain authorization for right knee gel injection -Bethany Kemp

## 2022-06-01 NOTE — Progress Notes (Signed)
Office Visit Note   Patient: Bethany Kemp           Date of Birth: 19-Apr-1960           MRN: [id number] Visit Date: 06/01/2022 Requested by: Rollene Rotunda, MD [address] [place],  [place] [address] PCP: Rollene Rotunda, MD  Subjective: Chief Complaint  Patient presents with   Right Knee - Follow-up, Pain   Lower Back - Pain, Follow-up    HPI: Bethany Kemp is a 62 y.o. female who presents to the office reporting right knee pain and low back pain.  Patient states that she saw Dr. August Saucer several weeks ago and the medications she was prescribed has not really helped.  She describes low back pain with left leg radicular pain that travels into her groin and down to her knee as well as numbness and tingling in the lateral calf.  No right leg radicular symptoms but she does have right knee pain focally in the anterior knee.  She has history of knee arthritis.  She has an upcoming trip on 06/17/2022 and she wants to be as prepared as possible for the amount of walking that she will have to do at that time.  No recent injury.  No weakness.  No red flag symptoms such as bowel/bladder incontinence or saddle anesthesia..                ROS: All systems reviewed are negative as they relate to the chief complaint within the history of present illness.  Patient denies fevers or chills.  Assessment & Plan: Visit Diagnoses:  1. Spinal stenosis of lumbar region, unspecified whether neurogenic claudication present     Plan: Patient is a 62 year old female who presents for reevaluation of right knee pain and low back pain with left leg radicular pain.  She would like to try right knee injection today for her knee arthritis.  This was administered and patient tolerated procedure well.  We will also get her preapproved for gel injection to see if this will help just in case her pain flares up during her trip to [place] so that we have something to give her when she comes back.  Also plan to set  her up with ESI with Dr. Alvester Morin.  Hopefully this can be done before her trip on 06/17/2022 but if not, we can reach out to [facility] imaging to see if they can work her in sooner.  Patient agreed with plan.  She does have some groin pain that does not seem related to her hip as I cannot reproduce this pain with hip range of motion or with Stinchfield sign.  We will see how her hip pain/groin pain responds to the back injection but if no relief from that, could consider diagnostic hip injection.  Follow-up as needed.  Follow-Up Instructions: No follow-ups on file.   Orders:  Orders Placed This Encounter  Procedures   Ambulatory referral to Physical Medicine Rehab   No orders of the defined types were placed in this encounter.     Procedures: Large Joint Inj: R knee on 06/01/2022 1:44 PM Indications: diagnostic evaluation, joint swelling and pain Details: 18 G 1.5 in needle, superolateral approach  Arthrogram: No  Medications: 5 mL lidocaine 1 %; 40 mg methylPREDNISolone acetate 40 MG/ML; 4 mL bupivacaine 0.25 % Outcome: tolerated well, no immediate complications Procedure, treatment alternatives, risks and benefits explained, specific risks discussed. Consent was given by the patient. Immediately prior to procedure  a time out was called to verify the correct patient, procedure, equipment, support staff and site/side marked as required. Patient was prepped and draped in the usual sterile fashion.       Clinical Data: No additional findings.  Objective: Vital Signs: LMP 01/13/2015   Physical Exam:  Constitutional: Patient appears well-developed HEENT:  Head: Normocephalic Eyes:EOM are normal Neck: Normal range of motion Cardiovascular: Normal rate Pulmonary/chest: Effort normal Neurologic: Patient is alert Skin: Skin is warm Psychiatric: Patient has normal mood and affect  Ortho Exam: Ortho exam demonstrates right knee without effusion.  She has range of motion from about 7  degrees extension to 95 degrees knee flexion.  Tenderness over the medial and lateral joint lines.  No cellulitis or skin changes noted.  She is able to perform straight leg raise without extensor lag.  No pain with hip range of motion.  She has excellent range of motion of the left hip with no reproduction of groin pain.  Intact hip flexion, quadricep, hamstring, dorsiflexion, plantarflexion, EHL strength rated 5/5 bilaterally.  Specialty Comments:  MRI LUMBAR SPINE WITHOUT CONTRAST   TECHNIQUE: Multiplanar, multisequence MR imaging of the lumbar spine was performed. No intravenous contrast was administered.   COMPARISON:  X-ray 06/11/2021   FINDINGS: Segmentation:  Standard.   Alignment:  3 mm grade 1 anterolisthesis of L4 on L5.   Vertebrae: No fracture, evidence of discitis, or bone lesion. Discogenic endplate marrow changes at L4-5 and L5-S1.   Conus medullaris and cauda equina: Conus extends to the L1 level. Conus and cauda equina appear normal.   Paraspinal and other soft tissues: Negative.   Disc levels:   T12-L1: Unremarkable.   L1-L2: Unremarkable.   L2-L3: Unremarkable.   L3-L4: Minimal annular disc bulge. Mild bilateral facet hypertrophy. No foraminal or canal stenosis.   L4-L5: Anterolisthesis with disc uncovering and diffuse disc bulge. Mild-moderate bilateral facet arthropathy. Moderate left and mild right foraminal stenosis. No canal stenosis.   L5-S1: Shallow right paracentral disc protrusion. No significant facet arthropathy. Mild bilateral foraminal stenosis. No canal stenosis.   IMPRESSION: 1. Mild lower lumbar spondylosis, most pronounced at L4-5 where there is moderate left and mild right foraminal stenosis. 2. Mild bilateral foraminal stenosis at L5-S1. 3. No canal stenosis at any level.     Electronically Signed   By: Duanne Guess D.O.   On: 06/22/2021 09:56  Imaging: No results found.   PMFS History: Patient Active Problem List    Diagnosis Date Noted   Overweight 08/29/2020   Essential hypertension 08/29/2020   Murmur 08/29/2020   Arthritis of left knee    S/P total knee arthroplasty, left 07/30/2020   Unilateral primary osteoarthritis, left knee 05/07/2016   Abnormal stress ECG with treadmill 07/30/2014   Palpitation 06/22/2014   Dyspnea 06/22/2014   Past Medical History:  Diagnosis Date   Anemia    Arthritis    Asthma    Complication of anesthesia    Diverticulitis    DVT (deep venous thrombosis) (HCC)    after knee surgery   Family history of adverse reaction to anesthesia    sister had a hard time waking up   Gastritis    1980's was hospitalized   GERD (gastroesophageal reflux disease)    Heart murmur    History of hiatal hernia    Hypertension    Obesities, morbid (HCC)    PONV (postoperative nausea and vomiting)    Pre-diabetes    Sleep apnea    BiPap  Family History  Problem Relation Age of Onset   Asthma Father    Heart attack Mother 51       Died with MI age 39   Arthritis Mother    Diabetes Mother    Stroke Sister    Breast cancer Sister    Asthma Daughter     Past Surgical History:  Procedure Laterality Date   BREAST REDUCTION SURGERY     BREAST SURGERY     BUNIONECTOMY  01/13/1991   bilateral   CARDIAC CATHETERIZATION N/A 07/30/2014   Procedure: Left Heart Cath and Coronary Angiography;  Surgeon: Tonny Bollman, MD;  Location: [facility] INVASIVE CV LAB;  Service: Cardiovascular;  Laterality: N/A;   CHOLECYSTECTOMY  01/13/2003   HERNIA REPAIR  01/13/1980   KNEE SURGERY  01/12/2001   Righ knee -- chondal implant   TOTAL KNEE ARTHROPLASTY Left 07/30/2020   Procedure: LEFT TOTAL KNEE ARTHROPLASTY;  Surgeon: [name], MD;  Location: [facility] OR;  Service: Orthopedics;  Laterality: Left;   Social History   Occupational History   Not on file  Tobacco Use   Smoking status: Never   Smokeless tobacco: Never  Vaping Use   Vaping Use: Never used  Substance and Sexual  Activity   Alcohol use: No   Drug use: No   Sexual activity: Not on file

## 2022-06-02 NOTE — Telephone Encounter (Signed)
VOB submitted for Supartz, right knee.

## 2022-06-03 ENCOUNTER — Other Ambulatory Visit: Payer: Self-pay | Admitting: Orthopedic Surgery

## 2022-06-05 ENCOUNTER — Telehealth: Payer: Self-pay | Admitting: Physical Medicine and Rehabilitation

## 2022-06-05 NOTE — Telephone Encounter (Signed)
Patient is ready to schedule injection stated she is traveling soon and wanted the earliest appt

## 2022-06-05 NOTE — Telephone Encounter (Signed)
Spoke with patient and scheduled injection for 06/11/22. Patient aware driver needed 

## 2022-06-11 ENCOUNTER — Other Ambulatory Visit: Payer: Self-pay

## 2022-06-11 ENCOUNTER — Ambulatory Visit: Payer: Federal, State, Local not specified - PPO | Admitting: Physical Medicine and Rehabilitation

## 2022-06-11 VITALS — BP 136/78 | HR 125

## 2022-06-11 DIAGNOSIS — M5416 Radiculopathy, lumbar region: Secondary | ICD-10-CM | POA: Diagnosis not present

## 2022-06-11 MED ORDER — METHYLPREDNISOLONE ACETATE 80 MG/ML IJ SUSP
80.0000 mg | Freq: Once | INTRAMUSCULAR | Status: AC
Start: 2022-06-11 — End: 2022-06-11
  Administered 2022-06-11: 80 mg

## 2022-06-11 NOTE — Patient Instructions (Signed)

## 2022-06-11 NOTE — Progress Notes (Signed)
Functional Pain Scale - descriptive words and definitions  Intense (8)    Cannot complete any ADLs without much assistance/cannot concentrate/conversation is difficult/unable to sleep and unable to use distraction. Severe range order  Average Pain 10   +Driver, -BT, -Dye Allergies.  Lower back pain on both sides with radiation in the left leg to the ankle and right leg to the hip and buttocks

## 2022-06-12 NOTE — Procedures (Signed)
Lumbar Epidural Steroid Injection - Interlaminar Approach with Fluoroscopic Guidance  Patient: Bethany Kemp      Date of Birth: 1961/01/06 MRN: [id number] PCP: Rollene Rotunda, MD      Visit Date: 06/11/2022   Universal Protocol:     Consent Given By: the patient  Position: PRONE  Additional Comments: Vital signs were monitored before and after the procedure. Patient was prepped and draped in the usual sterile fashion. The correct patient, procedure, and site was verified.   Injection Procedure Details:   Procedure diagnoses: Lumbar radiculopathy [M54.16]   Meds Administered:  Meds ordered this encounter  Medications   methylPREDNISolone acetate (DEPO-MEDROL) injection 80 mg     Laterality: Midline  Location/Site:  L4-5  Needle: 4.5 in., 20 ga. Tuohy  Needle Placement: Paramedian epidural  Findings:   -Comments: Excellent flow of contrast into the epidural space.  Procedure Details: Using a paramedian approach from the side mentioned above, the region overlying the inferior lamina was localized under fluoroscopic visualization and the soft tissues overlying this structure were infiltrated with 4 ml. of 1% Lidocaine without Epinephrine. The Tuohy needle was inserted into the epidural space using a paramedian approach.   The epidural space was localized using loss of resistance along with counter oblique bi-planar fluoroscopic views.  After negative aspirate for air, blood, and CSF, a 2 ml. volume of Isovue-250 was injected into the epidural space and the flow of contrast was observed. Radiographs were obtained for documentation purposes.    The injectate was administered into the level noted above.   Additional Comments:  No complications occurred Dressing: 2 x 2 sterile gauze and Band-Aid    Post-procedure details: Patient was observed during the procedure. Post-procedure instructions were reviewed.  Patient left the clinic in stable condition.

## 2022-06-12 NOTE — Progress Notes (Signed)
Bethany Kemp - 62 y.o. female MRN [id number]  Date of birth: 1960/10/04  Office Visit Note: Visit Date: 06/11/2022 PCP: Rollene Rotunda, MD Referred by: Rollene Rotunda, MD  Subjective: Chief Complaint  Patient presents with   Lower Back - Pain   HPI:  Bethany Kemp is a 62 y.o. female who comes in today at the request of Dr. Burnard Bunting and Karenann Cai, PA-C for planned Midline L4-5 Lumbar Interlaminar epidural steroid injection with fluoroscopic guidance.  The patient has failed conservative care including home exercise, medications, time and activity modification.  This injection will be diagnostic and hopefully therapeutic.  Please see requesting physician notes for further details and justification.  Past fluoroscopic imaging shows more facet arthropathy than noted on MRI report.  Reviewed MRI pictures also shows more facet arthropathy than really elucidated on the report.  Consideration for flexion-extension films depending on relief with this.  She does have significant lateral recess narrowing.  Consider surgical referral.   ROS Otherwise per HPI.  Assessment & Plan: Visit Diagnoses:    ICD-10-CM   1. Lumbar radiculopathy  M54.16 XR C-ARM NO REPORT    Epidural Steroid injection    methylPREDNISolone acetate (DEPO-MEDROL) injection 80 mg      Plan: No additional findings.   Meds & Orders:  Meds ordered this encounter  Medications   methylPREDNISolone acetate (DEPO-MEDROL) injection 80 mg    Orders Placed This Encounter  Procedures   XR C-ARM NO REPORT   Epidural Steroid injection    Follow-up: Return for visit to requesting provider as needed.   Procedures: No procedures performed  Lumbar Epidural Steroid Injection - Interlaminar Approach with Fluoroscopic Guidance  Patient: Bethany Kemp      Date of Birth: 1960-08-31 MRN: [id number] PCP: Rollene Rotunda, MD      Visit Date: 06/11/2022   Universal Protocol:     Consent Given By: the  patient  Position: PRONE  Additional Comments: Vital signs were monitored before and after the procedure. Patient was prepped and draped in the usual sterile fashion. The correct patient, procedure, and site was verified.   Injection Procedure Details:   Procedure diagnoses: Lumbar radiculopathy [M54.16]   Meds Administered:  Meds ordered this encounter  Medications   methylPREDNISolone acetate (DEPO-MEDROL) injection 80 mg     Laterality: Midline  Location/Site:  L4-5  Needle: 4.5 in., 20 ga. Tuohy  Needle Placement: Paramedian epidural  Findings:   -Comments: Excellent flow of contrast into the epidural space.  Procedure Details: Using a paramedian approach from the side mentioned above, the region overlying the inferior lamina was localized under fluoroscopic visualization and the soft tissues overlying this structure were infiltrated with 4 ml. of 1% Lidocaine without Epinephrine. The Tuohy needle was inserted into the epidural space using a paramedian approach.   The epidural space was localized using loss of resistance along with counter oblique bi-planar fluoroscopic views.  After negative aspirate for air, blood, and CSF, a 2 ml. volume of Isovue-250 was injected into the epidural space and the flow of contrast was observed. Radiographs were obtained for documentation purposes.    The injectate was administered into the level noted above.   Additional Comments:  No complications occurred Dressing: 2 x 2 sterile gauze and Band-Aid    Post-procedure details: Patient was observed during the procedure. Post-procedure instructions were reviewed.  Patient left the clinic in stable condition.   Clinical History: MRI LUMBAR SPINE WITHOUT CONTRAST   TECHNIQUE: Multiplanar,  multisequence MR imaging of the lumbar spine was performed. No intravenous contrast was administered.   COMPARISON:  X-ray 06/11/2021   FINDINGS: Segmentation:  Standard.   Alignment:  3  mm grade 1 anterolisthesis of L4 on L5.   Vertebrae: No fracture, evidence of discitis, or bone lesion. Discogenic endplate marrow changes at L4-5 and L5-S1.   Conus medullaris and cauda equina: Conus extends to the L1 level. Conus and cauda equina appear normal.   Paraspinal and other soft tissues: Negative.   Disc levels:   T12-L1: Unremarkable.   L1-L2: Unremarkable.   L2-L3: Unremarkable.   L3-L4: Minimal annular disc bulge. Mild bilateral facet hypertrophy. No foraminal or canal stenosis.   L4-L5: Anterolisthesis with disc uncovering and diffuse disc bulge. Mild-moderate bilateral facet arthropathy. Moderate left and mild right foraminal stenosis. No canal stenosis.   L5-S1: Shallow right paracentral disc protrusion. No significant facet arthropathy. Mild bilateral foraminal stenosis. No canal stenosis.   IMPRESSION: 1. Mild lower lumbar spondylosis, most pronounced at L4-5 where there is moderate left and mild right foraminal stenosis. 2. Mild bilateral foraminal stenosis at L5-S1. 3. No canal stenosis at any level.     Electronically Signed   By: Duanne Guess D.O.   On: 06/22/2021 09:56     Objective:  VS:  HT:    WT:   BMI:     BP:136/78  HR:(!) 125bpm  TEMP: ( )  RESP:  Physical Exam Vitals and nursing note reviewed.  Constitutional:      General: She is not in acute distress.    Appearance: Normal appearance. She is obese. She is not ill-appearing.  HENT:     Head: Normocephalic and atraumatic.     Right Ear: External ear normal.     Left Ear: External ear normal.  Eyes:     Extraocular Movements: Extraocular movements intact.  Cardiovascular:     Rate and Rhythm: Normal rate.     Pulses: Normal pulses.  Pulmonary:     Effort: Pulmonary effort is normal. No respiratory distress.  Abdominal:     General: There is no distension.     Palpations: Abdomen is soft.  Musculoskeletal:        General: Tenderness present.     Cervical back:  Neck supple.     Right lower leg: No edema.     Left lower leg: No edema.     Comments: Patient has good distal strength with no pain over the greater trochanters.  No clonus or focal weakness.  Skin:    Findings: No erythema, lesion or rash.  Neurological:     General: No focal deficit present.     Mental Status: She is alert and oriented to person, place, and time.     Sensory: No sensory deficit.     Motor: No weakness or abnormal muscle tone.     Coordination: Coordination normal.  Psychiatric:        Mood and Affect: Mood normal.        Behavior: Behavior normal.      Imaging: No results found.

## 2022-06-15 DIAGNOSIS — Z1231 Encounter for screening mammogram for malignant neoplasm of breast: Secondary | ICD-10-CM | POA: Diagnosis not present

## 2022-07-01 ENCOUNTER — Telehealth: Payer: Self-pay

## 2022-07-01 ENCOUNTER — Ambulatory Visit: Payer: Federal, State, Local not specified - PPO | Admitting: Surgical

## 2022-07-01 DIAGNOSIS — G5702 Lesion of sciatic nerve, left lower limb: Secondary | ICD-10-CM | POA: Diagnosis not present

## 2022-07-01 DIAGNOSIS — M5416 Radiculopathy, lumbar region: Secondary | ICD-10-CM

## 2022-07-01 NOTE — Telephone Encounter (Signed)
PA was required for Supartz Fx, right knee.  PA has been submitted through [email or web address] portal.  PA still pending.  Talked with patient and advised her of message above.  Advised patient that I will call her to schedule once I have an approval.  Patient voiced that she understands.

## 2022-07-01 NOTE — Telephone Encounter (Signed)
Can you please reach out to patient and provide update on status of gel approval

## 2022-07-02 ENCOUNTER — Encounter: Payer: Self-pay | Admitting: Surgical

## 2022-07-02 DIAGNOSIS — H527 Unspecified disorder of refraction: Secondary | ICD-10-CM | POA: Diagnosis not present

## 2022-07-02 NOTE — Progress Notes (Signed)
Follow-up Office Visit Note   Patient: Bethany Kemp           Date of Birth: 07-Oct-1960           MRN: [id number] Visit Date: 07/01/2022 Requested by: Julieanne Cotton, PA-C [address] [place],  [place] [address] PCP: Rollene Rotunda, MD  Subjective: Chief Complaint  Patient presents with   Left Hip - Pain    HPI: Bethany Kemp is a 62 y.o. female who returns to the office for follow-up visit.    Plan at last visit was: Patient is a 62 year old female who presents for reevaluation of right knee pain and low back pain with left leg radicular pain. She would like to try right knee injection today for her knee arthritis. This was administered and patient tolerated procedure well. We will also get her preapproved for gel injection to see if this will help just in case her pain flares up during her trip to [place] so that we have something to give her when she comes back. Also plan to set her up with ESI with Dr. Alvester Morin. Hopefully this can be done before her trip on 06/17/2022 but if not, we can reach out to [facility] imaging to see if they can work her in sooner. Patient agreed with plan. She does have some groin pain that does not seem related to her hip as I cannot reproduce this pain with hip range of motion or with Stinchfield sign. We will see how her hip pain/groin pain responds to the back injection but if no relief from that, could consider diagnostic hip injection. Follow-up as needed.   Since then, patient notes she had midline L4-L5 paramedian epidural steroid injection with Dr. Alvester Morin on 06/11/2022 that provided about 75% relief of her back pain and resolution of her groin pain but still has continued buttock and thigh pain with radiation from the buttock into the thigh and down to the lateral calf with numbness and tingling in this distribution as well.  She states that she sees a massage therapist and physical therapist that have told her she has a "tight piriformis".  She has  had dry needling this location with some relief of her symptoms.  Takes amitriptyline at night.  No groin pain any longer.              ROS: All systems reviewed are negative as they relate to the chief complaint within the history of present illness.  Patient denies fevers or chills.  Assessment & Plan: Visit Diagnoses:  1. Lumbar radiculopathy   2. Piriformis syndrome of left side     Plan: Bethany Kemp is a 62 y.o. female who returns to the office for follow-up visit for radicular left leg pain.  Plan from last visit was noted above in HPI.  They now return with good resolution of some of her back pain and all of her groin pain with ESI by Dr. Alvester Morin on 06/11/2022.  She presents today for left hip intra-articular injection as she has continued left leg symptoms but with absolutely no groin pain that is bothering her and no reproducible pain with range of motion of her hip on exam today, do not really think she would get any relief from doing a left hip injection today.  We will hold off on this.  Did discuss options such as going back to see Dr. Alvester Morin to try and repeat a different type of L-spine ESI but she would like  to hold off on this.  She is concerned about piriformis syndrome as she has had multiple discussions with her therapist and dry needling sessions that have helped in this region.  Think that with the radicular nature of some of her pain, this is less likely but we can try and see if a diagnostic injection in this location will give her some relief.  Plan to refer her to Dr. Shon Baton for ultrasound-guided piriformis injection.  Follow-Up Instructions: No follow-ups on file.   Orders:  Orders Placed This Encounter  Procedures   Ambulatory referral to Orthopedic Surgery   No orders of the defined types were placed in this encounter.     Procedures: No procedures performed   Clinical Data: No additional findings.  Objective: Vital Signs: LMP 01/13/2015   Physical Exam:   Constitutional: Patient appears well-developed HEENT:  Head: Normocephalic Eyes:EOM are normal Neck: Normal range of motion Cardiovascular: Normal rate Pulmonary/chest: Effort normal Neurologic: Patient is alert Skin: Skin is warm Psychiatric: Patient has normal mood and affect  Ortho Exam: Ortho exam demonstrates intact hip flexion, quadricep, hamstring, dorsiflexion, plantarflexion, EHL rated 5/5.  Negative straight leg raise.  Mild tenderness over the greater trochanter.  No tenderness in the bilateral SI joints.  Mild to moderate tenderness throughout the axial lumbar spine.  She does have absolutely no pain with FADIR sign or Stinchfield sign.  Specialty Comments:  MRI LUMBAR SPINE WITHOUT CONTRAST   TECHNIQUE: Multiplanar, multisequence MR imaging of the lumbar spine was performed. No intravenous contrast was administered.   COMPARISON:  X-ray 06/11/2021   FINDINGS: Segmentation:  Standard.   Alignment:  3 mm grade 1 anterolisthesis of L4 on L5.   Vertebrae: No fracture, evidence of discitis, or bone lesion. Discogenic endplate marrow changes at L4-5 and L5-S1.   Conus medullaris and cauda equina: Conus extends to the L1 level. Conus and cauda equina appear normal.   Paraspinal and other soft tissues: Negative.   Disc levels:   T12-L1: Unremarkable.   L1-L2: Unremarkable.   L2-L3: Unremarkable.   L3-L4: Minimal annular disc bulge. Mild bilateral facet hypertrophy. No foraminal or canal stenosis.   L4-L5: Anterolisthesis with disc uncovering and diffuse disc bulge. Mild-moderate bilateral facet arthropathy. Moderate left and mild right foraminal stenosis. No canal stenosis.   L5-S1: Shallow right paracentral disc protrusion. No significant facet arthropathy. Mild bilateral foraminal stenosis. No canal stenosis.   IMPRESSION: 1. Mild lower lumbar spondylosis, most pronounced at L4-5 where there is moderate left and mild right foraminal stenosis. 2. Mild  bilateral foraminal stenosis at L5-S1. 3. No canal stenosis at any level.     Electronically Signed   By: Duanne Guess D.O.   On: 06/22/2021 09:56  Imaging: No results found.   PMFS History: Patient Active Problem List   Diagnosis Date Noted   Overweight 08/29/2020   Essential hypertension 08/29/2020   Murmur 08/29/2020   Arthritis of left knee    S/P total knee arthroplasty, left 07/30/2020   Unilateral primary osteoarthritis, left knee 05/07/2016   Abnormal stress ECG with treadmill 07/30/2014   Palpitation 06/22/2014   Dyspnea 06/22/2014   Past Medical History:  Diagnosis Date   Anemia    Arthritis    Asthma    Complication of anesthesia    Diverticulitis    DVT (deep venous thrombosis) (HCC)    after knee surgery   Family history of adverse reaction to anesthesia    sister had a hard time waking up  Gastritis    1980's was hospitalized   GERD (gastroesophageal reflux disease)    Heart murmur    History of hiatal hernia    Hypertension    Obesities, morbid (HCC)    PONV (postoperative nausea and vomiting)    Pre-diabetes    Sleep apnea    BiPap    Family History  Problem Relation Age of Onset   Asthma Father    Heart attack Mother 46       Died with MI age 39   Arthritis Mother    Diabetes Mother    Stroke Sister    Breast cancer Sister    Asthma Daughter     Past Surgical History:  Procedure Laterality Date   BREAST REDUCTION SURGERY     BREAST SURGERY     BUNIONECTOMY  01/13/1991   bilateral   CARDIAC CATHETERIZATION N/A 07/30/2014   Procedure: Left Heart Cath and Coronary Angiography;  Surgeon: Tonny Bollman, MD;  Location: [facility] INVASIVE CV LAB;  Service: Cardiovascular;  Laterality: N/A;   CHOLECYSTECTOMY  01/13/2003   HERNIA REPAIR  01/13/1980   KNEE SURGERY  01/12/2001   Righ knee -- chondal implant   TOTAL KNEE ARTHROPLASTY Left 07/30/2020   Procedure: LEFT TOTAL KNEE ARTHROPLASTY;  Surgeon: [name], MD;  Location: [facility]  OR;  Service: Orthopedics;  Laterality: Left;   Social History   Occupational History   Not on file  Tobacco Use   Smoking status: Never   Smokeless tobacco: Never  Vaping Use   Vaping Use: Never used  Substance and Sexual Activity   Alcohol use: No   Drug use: No   Sexual activity: Not on file

## 2022-07-06 ENCOUNTER — Other Ambulatory Visit: Payer: Self-pay | Admitting: Surgical

## 2022-07-13 ENCOUNTER — Ambulatory Visit: Payer: Federal, State, Local not specified - PPO | Admitting: Surgical

## 2022-07-14 ENCOUNTER — Other Ambulatory Visit: Payer: Self-pay

## 2022-07-14 DIAGNOSIS — M1711 Unilateral primary osteoarthritis, right knee: Secondary | ICD-10-CM

## 2022-07-21 ENCOUNTER — Ambulatory Visit (INDEPENDENT_AMBULATORY_CARE_PROVIDER_SITE_OTHER): Payer: Federal, State, Local not specified - PPO | Admitting: Sports Medicine

## 2022-07-21 ENCOUNTER — Other Ambulatory Visit: Payer: Self-pay

## 2022-07-21 ENCOUNTER — Encounter: Payer: Self-pay | Admitting: Sports Medicine

## 2022-07-21 DIAGNOSIS — M5416 Radiculopathy, lumbar region: Secondary | ICD-10-CM

## 2022-07-21 DIAGNOSIS — G5702 Lesion of sciatic nerve, left lower limb: Secondary | ICD-10-CM

## 2022-07-21 NOTE — Progress Notes (Signed)
Left piriformis pain Has tried dry needling, massage therapy, etc She does take anti-inflammatories when needed This pain has been present for about 7-8 months

## 2022-07-21 NOTE — Progress Notes (Signed)
Bethany Kemp - 62 y.o. female MRN [id number]  Date of birth: [birth year]  Office Visit Note: Visit Date: 07/21/2022 PCP: Rollene Rotunda, MD Referred by: Julieanne Cotton, PA-C  Subjective: Chief Complaint  Patient presents with   Left Hip - Pain   HPI: Bethany Kemp is a pleasant 62 y.o. female who presents today for left posterior hip and leg pain.  She has had low back pain with left-sided radicular pain down the posterior lateral aspect of the leg.  She has had an ESI with Dr. Alvester Morin that provided about 75% relief of her back pain and groin pain but still having pain in the buttock that radiates into the mid thigh.  Pertinent ROS were reviewed with the patient and found to be negative unless otherwise specified above in HPI.   Assessment & Plan: Visit Diagnoses:  1. Piriformis syndrome of left side   2. Lumbar radiculopathy    Plan: Had a discussion with family today regarding.  Diagnosis which does seem to have a component of piriformis syndrome.  She previously had some relief from a lumbar ESI injection but still having pain deeper within the buttock and provocative exams I do suggest piriformis syndrome.  Show decision-making, elected to proceed with ultrasound-guided piriformis injection, patient tolerated well.  Will allow for 48 hours of modified activity and rest, he may use Tylenol, ice or heat for any postinjection pain.  Did provide her with a set of piriformis stretching and home rehab exercises she may begin 48-72 hours from the injection.  Would give this about 10 days to see the full effect.  She will follow-up with Karenann Cai to discuss next steps. I am happy to see her otherwise as needed.  Follow-up: Return for f/u with [facility] as needed.   Meds & Orders: No orders of the defined types were placed in this encounter.   Orders Placed This Encounter  Procedures   US Guided Needle Placement - No Linked Charges   [place] Extrem Low Left Ltd      Procedures: US-Guided Piriformis Muscle/Tendon Sheath Injection, Left After discussion on risks/benefits/indications and informed verbal consent was obtained, a timeout was performed. The patient was lying in prone position on exam table. Using ultrasound guidance, the  Piriformis tendon sheath was identified with visualization of the surrounding neurovasculature. The overlying soft tissue was first anesthetized with 4 cc of lidocaine 1%.  The overlying area was then prepped with ChloraPrep and multiple alcohol swabs. Following sterile precautions, ultrasound was reapplied to visualize needle guidance with a 22-gauge 3.5" needle utilizing an in-plane approach from a lateral to medial approach to inject piriformis tendon sheath with 2:2:1 lidocaine:bupivicaine:betamethasone . Delivery of the injectate was visualized both on the medial and lateral side of the sciatic nerve. Patient tolerated procedure well without immediate complications.         Clinical History: MRI LUMBAR SPINE WITHOUT CONTRAST   TECHNIQUE: Multiplanar, multisequence MR imaging of the lumbar spine was performed. No intravenous contrast was administered.   COMPARISON:  X-ray 06/11/2021   FINDINGS: Segmentation:  Standard.   Alignment:  3 mm grade 1 anterolisthesis of L4 on L5.   Vertebrae: No fracture, evidence of discitis, or bone lesion. Discogenic endplate marrow changes at L4-5 and L5-S1.   Conus medullaris and cauda equina: Conus extends to the L1 level. Conus and cauda equina appear normal.   Paraspinal and other soft tissues: Negative.   Disc levels:   T12-L1: Unremarkable.   L1-L2:  Unremarkable.   L2-L3: Unremarkable.   L3-L4: Minimal annular disc bulge. Mild bilateral facet hypertrophy. No foraminal or canal stenosis.   L4-L5: Anterolisthesis with disc uncovering and diffuse disc bulge. Mild-moderate bilateral facet arthropathy. Moderate left and mild right foraminal stenosis. No canal  stenosis.   L5-S1: Shallow right paracentral disc protrusion. No significant facet arthropathy. Mild bilateral foraminal stenosis. No canal stenosis.   IMPRESSION: 1. Mild lower lumbar spondylosis, most pronounced at L4-5 where there is moderate left and mild right foraminal stenosis. 2. Mild bilateral foraminal stenosis at L5-S1. 3. No canal stenosis at any level.     Electronically Signed   By: Duanne Guess D.O.   On: 06/22/2021 09:56  She reports that she has never smoked. She has never used smokeless tobacco. No results for input(s): "HGBA1C", "LABURIC" in the last 8760 hours.  Objective:   Vital Signs: LMP 01/13/2015   Physical Exam  Gen: Well-appearing, in no acute distress; non-toxic CV: Well-perfused. Warm.  Resp: Breathing unlabored on room air; no wheezing. Psych: Fluid speech in conversation; appropriate affect; normal thought process Neuro: Sensation intact throughout. No gross coordination deficits.   Ortho Exam - Low back/buttock: + TTP in the mid belly of the piriformis.  Negative Tinel's sign. There is full range of motion with internal and external rotation about the hip. + Fair test.  Imaging: [place] Extrem Low Left Ltd  Result Date: 07/21/2022 Limited musculoskeletal on the left lower extremity, left posterior buttock was evaluated today.  Overlying gluteus maximus muscle was seen without abnormality.  The piriformis muscle belly was seen both static and under dynamic view with internal/external rotation of the hip.  The sciatic nerve surrounding vasculature was seen superficial to the piriformis muscle belly.  Limited cortical view of the ischium and lateral side of the lateral trochanter was seen in field without cortical regularity. *Technically successful ultrasound-guided piriformis injection   Past Medical/Family/Surgical/Social History: Medications & Allergies reviewed per EMR, new medications updated. Patient Active Problem List   Diagnosis Date Noted    Overweight 08/29/2020   Essential hypertension 08/29/2020   Murmur 08/29/2020   Arthritis of left knee    S/P total knee arthroplasty, left 07/30/2020   Unilateral primary osteoarthritis, left knee 05/07/2016   Abnormal stress ECG with treadmill 07/30/2014   Palpitation 06/22/2014   Dyspnea 06/22/2014   Past Medical History:  Diagnosis Date   Anemia    Arthritis    Asthma    Complication of anesthesia    Diverticulitis    DVT (deep venous thrombosis) (HCC)    after knee surgery   Family history of adverse reaction to anesthesia    sister had a hard time waking up   Gastritis    1980's was hospitalized   GERD (gastroesophageal reflux disease)    Heart murmur    History of hiatal hernia    Hypertension    Obesities, morbid (HCC)    PONV (postoperative nausea and vomiting)    Pre-diabetes    Sleep apnea    BiPap   Family History  Problem Relation Age of Onset   Asthma Father    Heart attack Mother 62       Died with MI age 26   Arthritis Mother    Diabetes Mother    Stroke Sister    Breast cancer Sister    Asthma Daughter    Past Surgical History:  Procedure Laterality Date   BREAST REDUCTION SURGERY     BREAST SURGERY  BUNIONECTOMY  01/13/1991   bilateral   CARDIAC CATHETERIZATION N/A 07/30/2014   Procedure: Left Heart Cath and Coronary Angiography;  Surgeon: Tonny Bollman, MD;  Location: [facility] INVASIVE CV LAB;  Service: Cardiovascular;  Laterality: N/A;   CHOLECYSTECTOMY  01/13/2003   HERNIA REPAIR  01/13/1980   KNEE SURGERY  01/12/2001   Righ knee -- chondal implant   TOTAL KNEE ARTHROPLASTY Left 07/30/2020   Procedure: LEFT TOTAL KNEE ARTHROPLASTY;  Surgeon: [name], MD;  Location: [facility] OR;  Service: Orthopedics;  Laterality: Left;   Social History   Occupational History   Not on file  Tobacco Use   Smoking status: Never   Smokeless tobacco: Never  Vaping Use   Vaping Use: Never used  Substance and Sexual Activity   Alcohol use: No    Drug use: No   Sexual activity: Not on file

## 2022-07-24 ENCOUNTER — Telehealth: Payer: Self-pay | Admitting: Orthopedic Surgery

## 2022-07-24 ENCOUNTER — Ambulatory Visit (INDEPENDENT_AMBULATORY_CARE_PROVIDER_SITE_OTHER): Payer: Federal, State, Local not specified - PPO | Admitting: Orthopedic Surgery

## 2022-07-24 ENCOUNTER — Encounter: Payer: Self-pay | Admitting: Orthopedic Surgery

## 2022-07-24 DIAGNOSIS — M1711 Unilateral primary osteoarthritis, right knee: Secondary | ICD-10-CM

## 2022-07-24 MED ORDER — SODIUM HYALURONATE (VISCOSUP) 25 MG/2.5ML IX SOSY
25.0000 mg | PREFILLED_SYRINGE | INTRA_ARTICULAR | Status: AC | PRN
Start: 2022-07-24 — End: 2022-07-24
  Administered 2022-07-24: 25 mg via INTRA_ARTICULAR

## 2022-07-24 MED ORDER — LIDOCAINE HCL 1 % IJ SOLN
5.0000 mL | INTRAMUSCULAR | Status: AC | PRN
Start: 2022-07-24 — End: 2022-07-24
  Administered 2022-07-24: 5 mL

## 2022-07-24 NOTE — Telephone Encounter (Signed)
scheduled

## 2022-07-24 NOTE — Progress Notes (Signed)
   Procedure Note  Patient: Bethany Kemp             Date of Birth: 19-Jul-1960           MRN: [id number]             Visit Date: 07/24/2022  Procedures: Visit Diagnoses:  1. Arthritis of right knee     Large Joint Inj: R knee on 07/24/2022 12:38 PM Indications: diagnostic evaluation, joint swelling and pain Details: 18 G 1.5 in needle, superolateral approach  Arthrogram: No  Medications: 5 mL lidocaine 1 %; 25 mg Sodium Hyaluronate (Viscosup) 25 MG/2.5ML Outcome: tolerated well, no immediate complications Procedure, treatment alternatives, risks and benefits explained, specific risks discussed. Consent was given by the patient. Immediately prior to procedure a time out was called to verify the correct patient, procedure, equipment, support staff and site/side marked as required. Patient was prepped and draped in the usual sterile fashion.

## 2022-07-24 NOTE — Telephone Encounter (Signed)
Pt needs 2 more appts for Gel injections Next Friday afternoon around 3 and the following Friday for the 3rd injection please advise

## 2022-07-28 DIAGNOSIS — E119 Type 2 diabetes mellitus without complications: Secondary | ICD-10-CM | POA: Diagnosis not present

## 2022-07-28 DIAGNOSIS — E785 Hyperlipidemia, unspecified: Secondary | ICD-10-CM | POA: Diagnosis not present

## 2022-07-31 ENCOUNTER — Encounter: Payer: Self-pay | Admitting: Surgical

## 2022-07-31 ENCOUNTER — Ambulatory Visit (INDEPENDENT_AMBULATORY_CARE_PROVIDER_SITE_OTHER): Payer: Federal, State, Local not specified - PPO | Admitting: Surgical

## 2022-07-31 DIAGNOSIS — M1711 Unilateral primary osteoarthritis, right knee: Secondary | ICD-10-CM

## 2022-07-31 MED ORDER — LIDOCAINE HCL 1 % IJ SOLN
5.0000 mL | INTRAMUSCULAR | Status: AC | PRN
Start: 2022-07-31 — End: 2022-07-31
  Administered 2022-07-31: 5 mL

## 2022-07-31 MED ORDER — SODIUM HYALURONATE (VISCOSUP) 25 MG/2.5ML IX SOSY
25.0000 mg | PREFILLED_SYRINGE | INTRA_ARTICULAR | Status: AC | PRN
Start: 2022-07-31 — End: 2022-07-31
  Administered 2022-07-31: 25 mg via INTRA_ARTICULAR

## 2022-07-31 NOTE — Progress Notes (Signed)
   Procedure Note  Patient: Bethany Kemp             Date of Birth: [birth year]           MRN: [id number]             Visit Date: 07/31/2022  Procedures: Visit Diagnoses: No diagnosis found.  Large Joint Inj: R knee on 07/31/2022 2:30 PM Indications: diagnostic evaluation, joint swelling and pain Details: 18 G 1.5 in needle, superolateral approach  Arthrogram: No  Medications: 5 mL lidocaine 1 %; 25 mg Sodium Hyaluronate (Viscosup) 25 MG/2.5ML Outcome: tolerated well, no immediate complications Procedure, treatment alternatives, risks and benefits explained, specific risks discussed. Consent was given by the patient. Immediately prior to procedure a time out was called to verify the correct patient, procedure, equipment, support staff and site/side marked as required. Patient was prepped and draped in the usual sterile fashion.

## 2022-08-03 ENCOUNTER — Other Ambulatory Visit: Payer: Self-pay | Admitting: Surgical

## 2022-08-07 ENCOUNTER — Ambulatory Visit: Payer: Federal, State, Local not specified - PPO | Admitting: Surgical

## 2022-08-07 DIAGNOSIS — M1711 Unilateral primary osteoarthritis, right knee: Secondary | ICD-10-CM

## 2022-08-08 ENCOUNTER — Encounter: Payer: Self-pay | Admitting: Surgical

## 2022-08-08 MED ORDER — SODIUM HYALURONATE (VISCOSUP) 25 MG/2.5ML IX SOSY
25.0000 mg | PREFILLED_SYRINGE | INTRA_ARTICULAR | Status: AC | PRN
Start: 2022-08-07 — End: 2022-08-07
  Administered 2022-08-07: 25 mg via INTRA_ARTICULAR

## 2022-08-08 MED ORDER — LIDOCAINE HCL 1 % IJ SOLN
5.0000 mL | INTRAMUSCULAR | Status: AC | PRN
Start: 2022-08-07 — End: 2022-08-07
  Administered 2022-08-07: 5 mL

## 2022-08-08 NOTE — Progress Notes (Signed)
   Procedure Note  Patient: Bethany Kemp             Date of Birth: [birth year]           MRN: [id number]             Visit Date: 08/07/2022  Procedures: Visit Diagnoses:  1. Arthritis of right knee     Large Joint Inj: R knee on 08/07/2022 11:50 AM Indications: diagnostic evaluation, joint swelling and pain Details: 18 G 1.5 in needle, superolateral approach  Arthrogram: No  Medications: 5 mL lidocaine 1 %; 25 mg Sodium Hyaluronate (Viscosup) 25 MG/2.5ML Outcome: tolerated well, no immediate complications Procedure, treatment alternatives, risks and benefits explained, specific risks discussed. Consent was given by the patient. Immediately prior to procedure a time out was called to verify the correct patient, procedure, equipment, support staff and site/side marked as required. Patient was prepped and draped in the usual sterile fashion.

## 2022-08-12 DIAGNOSIS — R1032 Left lower quadrant pain: Secondary | ICD-10-CM | POA: Diagnosis not present

## 2022-08-12 DIAGNOSIS — K589 Irritable bowel syndrome without diarrhea: Secondary | ICD-10-CM | POA: Diagnosis not present

## 2023-01-20 DIAGNOSIS — R7989 Other specified abnormal findings of blood chemistry: Secondary | ICD-10-CM | POA: Diagnosis not present

## 2023-01-20 DIAGNOSIS — E119 Type 2 diabetes mellitus without complications: Secondary | ICD-10-CM | POA: Diagnosis not present

## 2023-01-20 DIAGNOSIS — G8929 Other chronic pain: Secondary | ICD-10-CM | POA: Diagnosis not present

## 2023-01-26 DIAGNOSIS — E119 Type 2 diabetes mellitus without complications: Secondary | ICD-10-CM | POA: Diagnosis not present

## 2023-01-26 DIAGNOSIS — E785 Hyperlipidemia, unspecified: Secondary | ICD-10-CM | POA: Diagnosis not present

## 2023-01-26 DIAGNOSIS — G8929 Other chronic pain: Secondary | ICD-10-CM | POA: Diagnosis not present

## 2023-01-26 DIAGNOSIS — R7989 Other specified abnormal findings of blood chemistry: Secondary | ICD-10-CM | POA: Diagnosis not present

## 2023-02-11 DIAGNOSIS — K219 Gastro-esophageal reflux disease without esophagitis: Secondary | ICD-10-CM | POA: Diagnosis not present

## 2023-02-11 DIAGNOSIS — R1012 Left upper quadrant pain: Secondary | ICD-10-CM | POA: Diagnosis not present

## 2023-03-04 DIAGNOSIS — R1012 Left upper quadrant pain: Secondary | ICD-10-CM | POA: Diagnosis not present

## 2023-03-04 DIAGNOSIS — K219 Gastro-esophageal reflux disease without esophagitis: Secondary | ICD-10-CM | POA: Diagnosis not present

## 2023-03-04 DIAGNOSIS — K2930 Chronic superficial gastritis without bleeding: Secondary | ICD-10-CM | POA: Diagnosis not present

## 2023-04-18 IMAGING — MR MR LUMBAR SPINE W/O CM
4 of 5 series · 27 of 48 positions shown · non-contrast
Comparison: X-ray 06/11/2021

CLINICAL DATA: Low back pain with bilateral radiculopathy

EXAM:
MRI LUMBAR SPINE WITHOUT CONTRAST
TECHNIQUE: Multiplanar, multisequence MR imaging of the lumbar spine was
performed. No intravenous contrast was administered.

[Series 3: T2 · sagittal · 4.0mm · 1.09mm/px · 6 of 16 slices shown (1 of 2)]
[im 1/16]
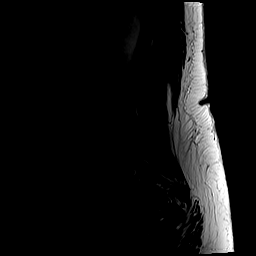
[im 4/16]
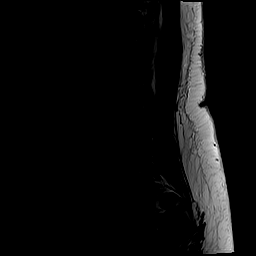
[im 7/16]
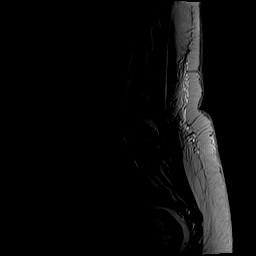
[im 10/16]
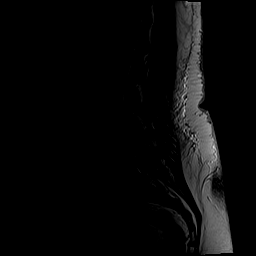
[im 13/16]
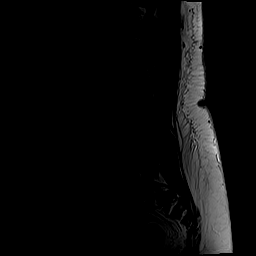
[im 16/16]
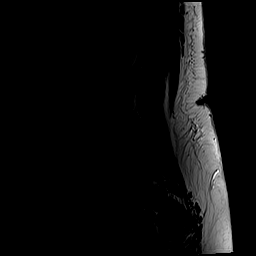

[Series 5: T1 · sagittal · 4.0mm · 1.09mm/px · 6 of 16 slices shown (1 of 2)]
[im 1/16]
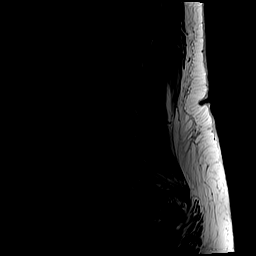
[im 4/16]
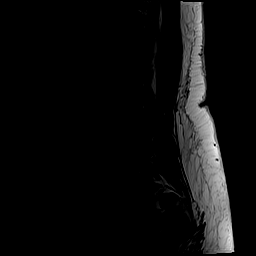
[im 7/16]
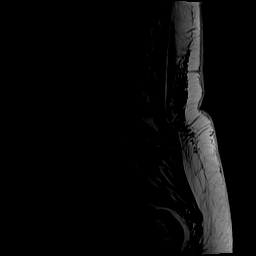
[im 10/16]
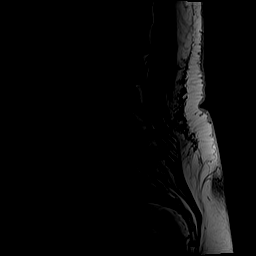
[im 13/16]
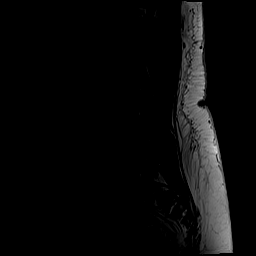
[im 16/16]
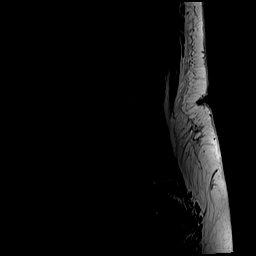

[Series 6: T2 · axial · 3.5mm · 0.43mm/px · z∈[-4,+224]mm · 9 of 43 slices shown (2 of 2)]
[im 1/43]
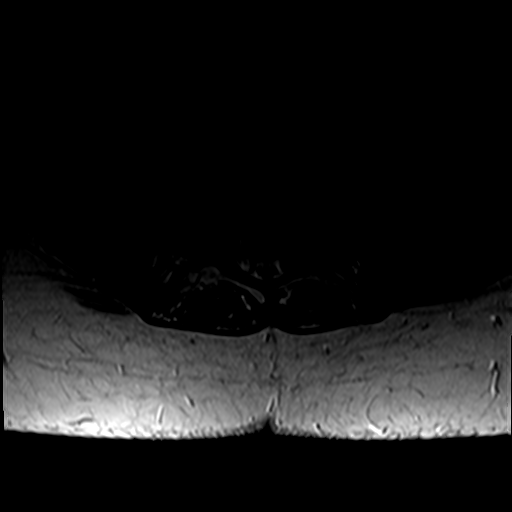
[im 7/43]
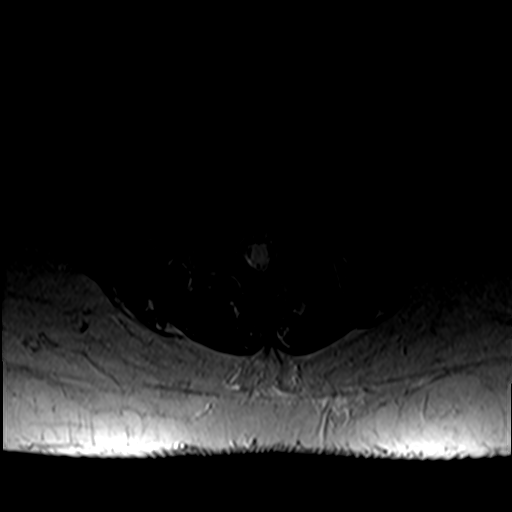
[im 13/43]
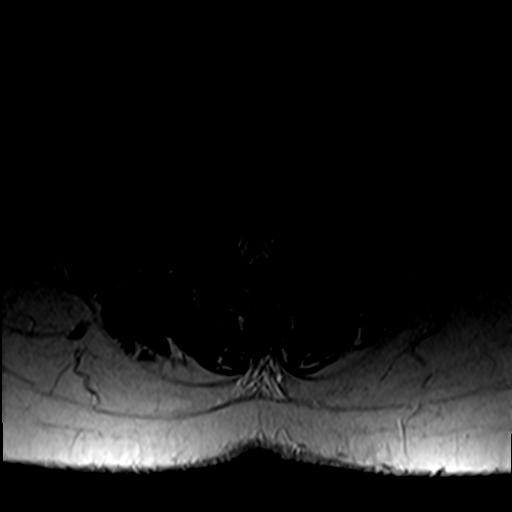
[im 19/43]
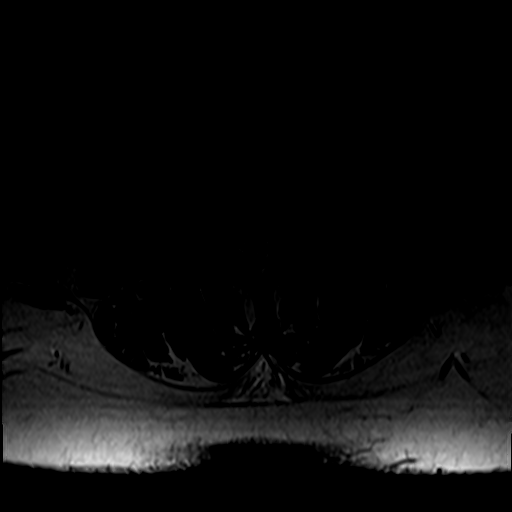
[im 22/43]
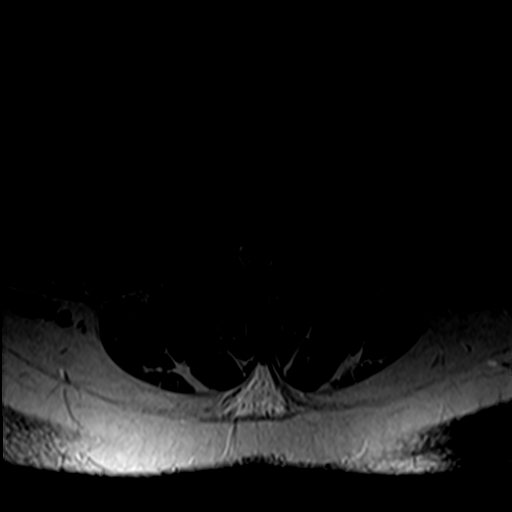
[im 25/43]
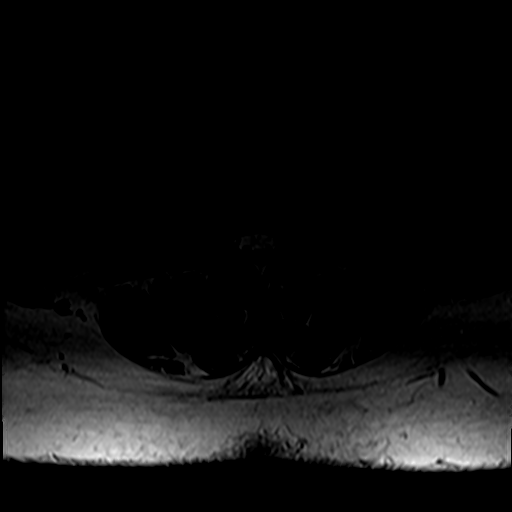
[im 31/43]
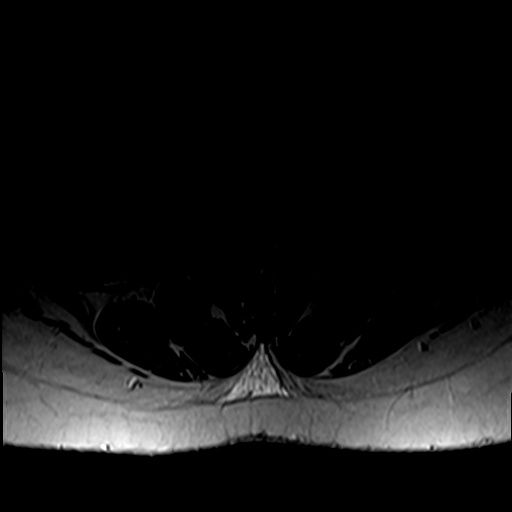
[im 37/43]
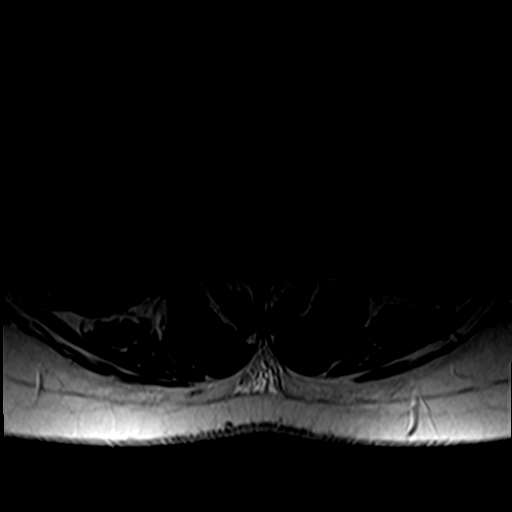
[im 43/43]
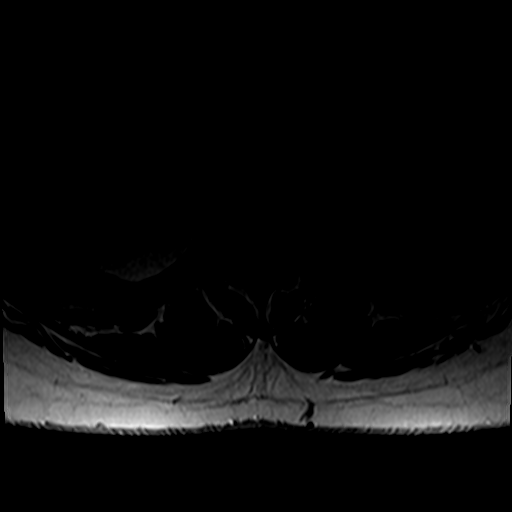

[Series 7: T1 · axial · 3.5mm · 0.39mm/px · z∈[-0,+197]mm · 6 of 43 slices shown (2 of 2)]
[im 1/43]
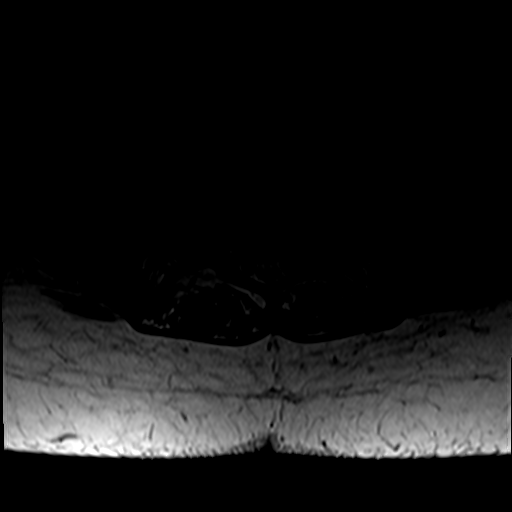
[im 7/43]
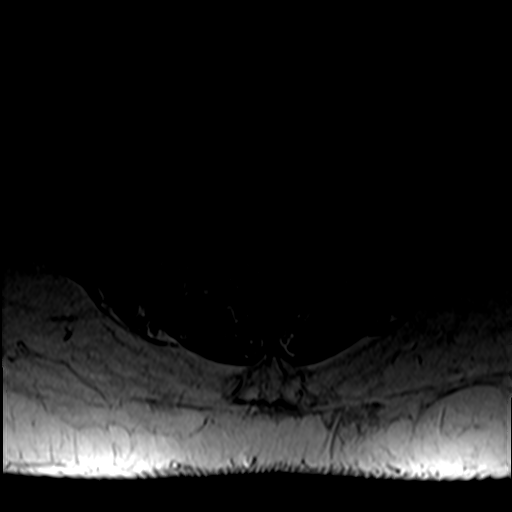
[im 13/43]
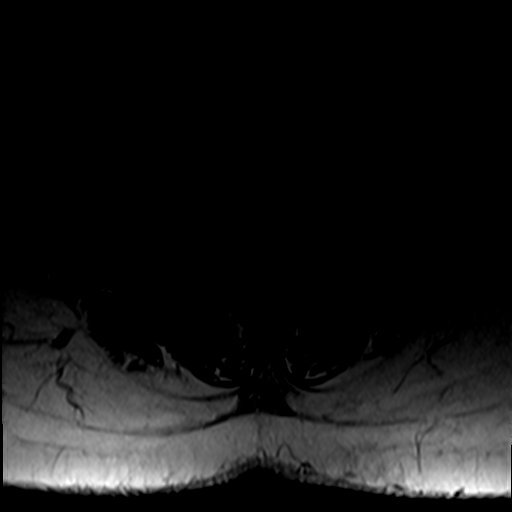
[im 19/43]
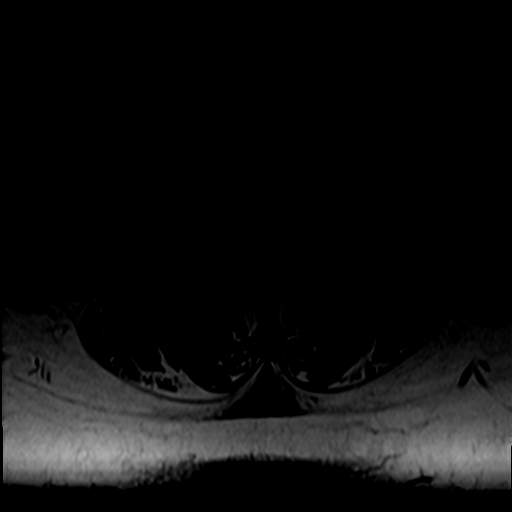
[im 22/43]
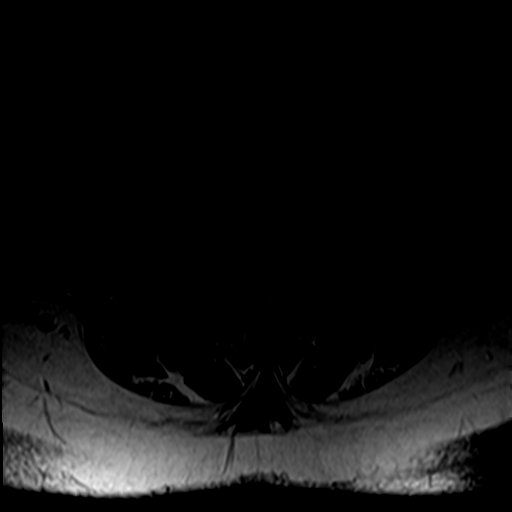
[im 37/43]
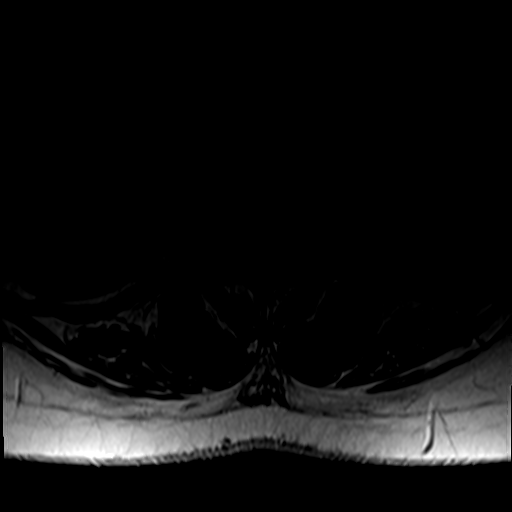

[27 of 48 positions shown; findings below may reference images not displayed]

FINDINGS: Segmentation:  Standard.

Alignment:  3 mm grade 1 anterolisthesis of L4 on L5.

Vertebrae: No fracture, evidence of discitis, or bone lesion.
Discogenic endplate marrow changes at L4-5 and L5-S1.

Conus medullaris and cauda equina: Conus extends to the L1 level.
Conus and cauda equina appear normal.

Paraspinal and other soft tissues: Negative.

Disc levels:

T12-L1: Unremarkable.

L1-L2: Unremarkable.

L2-L3: Unremarkable.

L3-L4: Minimal annular disc bulge. Mild bilateral facet hypertrophy.
No foraminal or canal stenosis.

L4-L5: Anterolisthesis with disc uncovering and diffuse disc bulge.
Mild-moderate bilateral facet arthropathy. Moderate left and mild
right foraminal stenosis. No canal stenosis.

L5-S1: Shallow right paracentral disc protrusion. No significant
facet arthropathy. Mild bilateral foraminal stenosis. No canal
stenosis.
IMPRESSION: 1. Mild lower lumbar spondylosis, most pronounced at L4-5 where
there is moderate left and mild right foraminal stenosis.
2. Mild bilateral foraminal stenosis at L5-S1.
3. No canal stenosis at any level.

## 2023-07-19 DIAGNOSIS — K219 Gastro-esophageal reflux disease without esophagitis: Secondary | ICD-10-CM | POA: Diagnosis not present

## 2023-07-19 DIAGNOSIS — R110 Nausea: Secondary | ICD-10-CM | POA: Diagnosis not present

## 2023-07-19 DIAGNOSIS — R1032 Left lower quadrant pain: Secondary | ICD-10-CM | POA: Diagnosis not present

## 2023-07-19 DIAGNOSIS — K5904 Chronic idiopathic constipation: Secondary | ICD-10-CM | POA: Diagnosis not present

## 2023-07-22 ENCOUNTER — Ambulatory Visit
Admission: EM | Admit: 2023-07-22 | Discharge: 2023-07-22 | Disposition: A | Discharge status: Home / Self Care | Attending: Internal Medicine | Admitting: Internal Medicine

## 2023-07-22 ENCOUNTER — Encounter: Payer: Self-pay | Admitting: Emergency Medicine

## 2023-07-22 DIAGNOSIS — R1032 Left lower quadrant pain: Secondary | ICD-10-CM

## 2023-07-22 DIAGNOSIS — R112 Nausea with vomiting, unspecified: Secondary | ICD-10-CM | POA: Diagnosis not present

## 2023-07-22 DIAGNOSIS — K5732 Diverticulitis of large intestine without perforation or abscess without bleeding: Secondary | ICD-10-CM | POA: Diagnosis not present

## 2023-07-22 MED ORDER — CIPROFLOXACIN HCL 500 MG PO TABS: 500.0000 mg | ORAL_TABLET | Freq: Two times a day (BID) | ORAL | 0 refills | Status: AC

## 2023-07-22 MED ORDER — KETOROLAC TROMETHAMINE 30 MG/ML IJ SOLN
30.0000 mg | Freq: Once | INTRAMUSCULAR | Status: AC
  Administered 2023-07-22: 30 mg via INTRAMUSCULAR

## 2023-07-22 MED ORDER — METRONIDAZOLE 500 MG PO TABS: 500.0000 mg | ORAL_TABLET | Freq: Two times a day (BID) | ORAL | 0 refills | Status: AC

## 2023-07-22 MED ORDER — ONDANSETRON 4 MG PO TBDP
4.0000 mg | ORAL_TABLET | Freq: Once | ORAL | Status: AC
  Administered 2023-07-22: 4 mg via ORAL

## 2023-07-22 NOTE — Discharge Instructions (Addendum)
 Take antibiotics as prescribed. No alcohol within 72 hours of taking flagyl . We gave you a shot of ketorolac  in the clinic today (strong anti-inflammatory medicine). Zofran  every 8 hours as needed for nausea/vomiting.  Follow-up with GI/PCP.   Go to the ER if pain worsens or if you are unable to keep fluids down for 24 hours without vomiting or having diarrhea. Go to ER if you develop blood or mucous in your stools.

## 2023-07-22 NOTE — ED Triage Notes (Signed)
 Pt c/o left lower quadrant pain, vomiting that began yesterday. States pain is comes and goes but is worse after eating. She has not thrown up today.

## 2023-07-22 NOTE — ED Provider Notes (Signed)
 [facility]    CSN: [id number] Arrival date & time: 07/22/23  0800      History   Chief Complaint Chief Complaint  Patient presents with   Abdominal Pain    HPI Bethany Kemp is a 63 y.o. female.   Bethany Kemp is a 63 y.o. female presenting for chief complaint of left lower quadrant abdominal pain that started suddenly last night.  She went to the gym yesterday and felt fine but then started having left lower quadrant abdominal pain after eating dinner.  Pain is colicky and intermittent.  She has had nausea with a few episodes of nonbilious/nonbloody emesis over the last 12 hours. History of diverticulosis with diverticulitis flareup.  States this feels very similar to the last time she had diverticulitis in 2024 requiring treatment with antibiotics.  She recently saw her GI specialist 4 days ago but was not having symptoms of stomach upset at that time.  Last bowel movement was yesterday, however she does report the bowel movement was soft but small.  No blood or mucus in the stool. Denies urinary symptoms, vaginal symptoms, flank pain, gross hematuria, dizziness, headache, and fever/chills.  She had a hernia repair in 1982 and a cholecystectomy in 2005.  She is postmenopausal. Tylenol  and ibuprofen cause GI upset, she has taken Zofran  with some relief of nausea.    Abdominal Pain   Past Medical History:  Diagnosis Date   Anemia    Arthritis    Asthma    Complication of anesthesia    Diverticulitis    DVT (deep venous thrombosis) (HCC)    after knee surgery   Family history of adverse reaction to anesthesia    sister had a hard time waking up   Gastritis    1980's was hospitalized   GERD (gastroesophageal reflux disease)    Heart murmur    History of hiatal hernia    Hypertension    Obesities, morbid (HCC)    PONV (postoperative nausea and vomiting)    Pre-diabetes    Sleep apnea    BiPap    Patient Active Problem List   Diagnosis Date Noted    Overweight 08/29/2020   Essential hypertension 08/29/2020   Murmur 08/29/2020   Arthritis of left knee    S/P total knee arthroplasty, left 07/30/2020   Unilateral primary osteoarthritis, left knee 05/07/2016   Abnormal stress ECG with treadmill 07/30/2014   Palpitation 06/22/2014   Dyspnea 06/22/2014    Past Surgical History:  Procedure Laterality Date   BREAST REDUCTION SURGERY     BREAST SURGERY     BUNIONECTOMY  01/13/1991   bilateral   CARDIAC CATHETERIZATION N/A 07/30/2014   Procedure: Left Heart Cath and Coronary Angiography;  Surgeon: Ozell Fell, MD;  Location: [facility] INVASIVE CV LAB;  Service: Cardiovascular;  Laterality: N/A;   CHOLECYSTECTOMY  01/13/2003   HERNIA REPAIR  01/13/1980   KNEE SURGERY  01/12/2001   Righ knee -- chondal implant   TOTAL KNEE ARTHROPLASTY Left 07/30/2020   Procedure: LEFT TOTAL KNEE ARTHROPLASTY;  Surgeon: Addie Cordella Hamilton, MD;  Location: [facility] OR;  Service: Orthopedics;  Laterality: Left;    OB History   No obstetric history on file.      Home Medications    Prior to Admission medications   Medication Sig Start Date End Date Taking? Authorizing Provider  ciprofloxacin  (CIPRO ) 500 MG tablet Take 1 tablet (500 mg total) by mouth every 12 (twelve) hours for 7 days. 07/22/23 07/29/23  Yes Enedelia Dorna HERO, FNP  metroNIDAZOLE  (FLAGYL ) 500 MG tablet Take 1 tablet (500 mg total) by mouth 2 (two) times daily for 7 days. 07/22/23 07/29/23 Yes Eilam Sh[place], Dorna HERO, FNP  albuterol  (PROVENTIL  HFA;VENTOLIN  HFA) 108 (90 BASE) MCG/ACT inhaler Inhale 2 puffs into the lungs every 4 (four) hours as needed for wheezing or shortness of breath.    [provider]  amitriptyline  (ELAVIL ) 25 MG tablet Take 75 mg by mouth at bedtime.    [provider]  amLODipine  (NORVASC ) 10 MG tablet Take 1 tablet (10 mg total) by mouth daily. 08/03/18   Lavona Agent, MD  atenolol  (TENORMIN ) 50 MG tablet Take 50 mg by mouth daily.    [provider]  cholecalciferol (VITAMIN D) 25 MCG (1000 UNIT) tablet Take 2,000 Units by mouth daily. 01/16/19   [provider]  diclofenac Sodium (VOLTAREN) 1 % GEL Apply 2 g topically daily as needed (pain). 03/02/19   [provider]  fluticasone (FLONASE) 50 MCG/ACT nasal spray Place 2 sprays into both nostrils daily as needed for allergies. 03/02/19   [provider]  gabapentin  (NEURONTIN ) 400 MG capsule Take 400 mg by mouth at bedtime as needed (pain).    [provider]  hydrochlorothiazide (HYDRODIURIL) 25 MG tablet Take 12.5 tablets by mouth daily as needed (fluid). 08/12/14   [provider]  hydroxypropyl methylcellulose / hypromellose (ISOPTO TEARS / GONIOVISC) 2.5 % ophthalmic solution Place 1 drop into both eyes 3 (three) times daily as needed for dry eyes.    [provider]  loratadine  (CLARITIN ) 10 MG tablet Take 10 mg by mouth daily as needed for allergies.    [provider]  losartan  (COZAAR ) 50 MG tablet Take 50 mg by mouth daily.    [provider]  methocarbamol  (ROBAXIN ) 500 MG tablet Take 1 tablet (500 mg total) by mouth every 8 (eight) hours as needed for muscle spasms. 07/31/20   Magnant, Charles L, PA-C  methocarbamol  (ROBAXIN ) 500 MG tablet Take 1 tablet (500 mg total) by mouth every 8 (eight) hours as needed for muscle spasms. 05/06/22   Addie Cordella Hamilton, MD  mometasone [facility]) 220 MCG/INH inhaler Inhale 2 puffs into the lungs at bedtime.    [provider]  nabumetone  (RELAFEN ) 500 MG tablet TAKE 1 TABLET(500 MG) BY MOUTH DAILY 08/03/22   Magnant, Charles L, PA-C  omeprazole (PRILOSEC) 20 MG capsule Take 20 mg by mouth 2 (two) times daily. 08/12/14   [provider]    Family History Family History  Problem Relation Age of Onset   Asthma Father    Heart attack Mother 59       Died with MI age 70   Arthritis Mother    Diabetes Mother    Stroke Sister    Breast cancer Sister    Asthma  Daughter     Social History Social History   Tobacco Use   Smoking status: Never   Smokeless tobacco: Never  Vaping Use   Vaping status: Never Used  Substance Use Topics   Alcohol use: No   Drug use: No     Allergies   Fexofenadine, Ibuprofen, Lisinopril, Acetaminophen , Aspirin , and Spironolactone   Review of Systems Review of Systems  Gastrointestinal:  Positive for abdominal pain.  Per HPI   Physical Exam Triage Vital Signs ED Triage Vitals  Encounter Vitals Group     BP 07/22/23 0814 (!) 154/88     Girls Systolic BP Percentile --  Girls Diastolic BP Percentile --      Boys Systolic BP Percentile --      Boys Diastolic BP Percentile --      Pulse Rate 07/22/23 0814 65     Resp 07/22/23 0814 17     Temp 07/22/23 0814 98 F (36.7 C)     Temp Source 07/22/23 0814 Oral     SpO2 07/22/23 0814 97 %     Weight --      Height --      Head Circumference --      Peak Flow --      Pain Score 07/22/23 0817 8     Pain Loc --      Pain Education --      Exclude from Growth Chart --    No data found.  Updated Vital Signs BP (!) 154/88 (BP Location: Right Arm)   Pulse 65   Temp 98 F (36.7 C) (Oral)   Resp 17   LMP 01/13/2015   SpO2 97%   Visual Acuity Right Eye Distance:   Left Eye Distance:   Bilateral Distance:    Right Eye Near:   Left Eye Near:    Bilateral Near:     Physical Exam Vitals and nursing note reviewed.  Constitutional:      Appearance: She is not ill-appearing or toxic-appearing.  HENT:     Head: Normocephalic and atraumatic.     Right Ear: Hearing and external ear normal.     Left Ear: Hearing and external ear normal.     Nose: Nose normal.     Mouth/Throat:     Lips: Pink.  Eyes:     General: Lids are normal. Vision grossly intact. Gaze aligned appropriately.     Extraocular Movements: Extraocular movements intact.     Conjunctiva/sclera: Conjunctivae normal.  Pulmonary:     Effort: Pulmonary effort is normal.   Abdominal:     General: Abdomen is flat. Bowel sounds are normal.     Palpations: Abdomen is soft.     Tenderness: There is abdominal tenderness in the left lower quadrant. There is no right CVA tenderness, left CVA tenderness, guarding or rebound. Negative signs include Murphy's sign, Rovsing's sign and McBurney's sign.     Comments: No peritoneal signs on abdominal exam.  Musculoskeletal:     Cervical back: Neck supple.  Skin:    General: Skin is warm and dry.     Capillary Refill: Capillary refill takes less than 2 seconds.     Findings: No rash.  Neurological:     General: No focal deficit present.     Mental Status: She is alert and oriented to person, place, and time. Mental status is at baseline.     Cranial Nerves: No dysarthria or facial asymmetry.  Psychiatric:        Mood and Affect: Mood normal.        Speech: Speech normal.        Behavior: Behavior normal.        Thought Content: Thought content normal.        Judgment: Judgment normal.      UC Treatments / Results  Labs (all labs ordered are listed, but only abnormal results are displayed) Labs Reviewed - No data to display  EKG   Radiology No results found.  Procedures Procedures (including critical care time)  Medications Ordered in UC Medications  ketorolac  (TORADOL ) 30 MG/ML injection 30 mg (30 mg Intramuscular Given 07/22/23  9156)  ondansetron  (ZOFRAN -ODT) disintegrating tablet 4 mg (4 mg Oral Given 07/22/23 0843)    Initial Impression / Assessment and Plan / UC Course  I have reviewed the triage vital signs and the nursing notes.  Pertinent labs & imaging results that were available during my care of the patient were reviewed by me and considered in my medical decision making (see chart for details).   1.  Diverticulitis of colon Presentation is consistent with acute diverticulitis flare. Ciprofloxacin  twice daily for 7 days and Flagyl  twice daily for 7 days ordered.  Ketorolac  30 mg IM given  in clinic for left lower quadrant abdominal pain.  Zofran  4 mg ODT given in clinic for nausea.  Recommend follow-up with PCP/GI provider in the next 5 to 7 days for recheck.  ER precautions discussed. She is non-toxic in appearance, afebrile, and with hemodynamically stable vital signs.   Counseled patient on potential for adverse effects with medications prescribed/recommended today, strict ER and return-to-clinic precautions discussed, patient verbalized understanding.    Final Clinical Impressions(s) / UC Diagnoses   Final diagnoses:  Diverticulitis of colon     Discharge Instructions      Take antibiotics as prescribed. No alcohol within 72 hours of taking flagyl . We gave you a shot of ketorolac  in the clinic today (strong anti-inflammatory medicine). Zofran  every 8 hours as needed for nausea/vomiting.  Follow-up with GI/PCP.   Go to the ER if pain worsens or if you are unable to keep fluids down for 24 hours without vomiting or having diarrhea. Go to ER if you develop blood or mucous in your stools.      ED Prescriptions     Medication Sig Dispense Auth. Provider   ciprofloxacin  (CIPRO ) 500 MG tablet Take 1 tablet (500 mg total) by mouth every 12 (twelve) hours for 7 days. 14 tablet Enedelia Dorna HERO, FNP   metroNIDAZOLE  (FLAGYL ) 500 MG tablet Take 1 tablet (500 mg total) by mouth 2 (two) times daily for 7 days. 14 tablet Enedelia Dorna HERO, FNP      PDMP not reviewed this encounter.   Enedelia Dorna [place], [place] 07/22/23 [phone]

## 2023-07-28 ENCOUNTER — Other Ambulatory Visit (HOSPITAL_BASED_OUTPATIENT_CLINIC_OR_DEPARTMENT_OTHER): Payer: Self-pay | Admitting: Family

## 2023-07-28 DIAGNOSIS — Z1231 Encounter for screening mammogram for malignant neoplasm of breast: Secondary | ICD-10-CM

## 2023-08-02 ENCOUNTER — Encounter (HOSPITAL_BASED_OUTPATIENT_CLINIC_OR_DEPARTMENT_OTHER): Payer: Self-pay

## 2023-08-02 ENCOUNTER — Encounter (HOSPITAL_BASED_OUTPATIENT_CLINIC_OR_DEPARTMENT_OTHER): Payer: Self-pay | Admitting: Radiology

## 2023-08-02 ENCOUNTER — Ambulatory Visit (HOSPITAL_BASED_OUTPATIENT_CLINIC_OR_DEPARTMENT_OTHER)
Admission: RE | Admit: 2023-08-02 | Discharge: 2023-08-02 | Disposition: A | Discharge status: Home / Self Care | Source: Ambulatory Visit | Attending: Family | Admitting: Family

## 2023-08-02 DIAGNOSIS — Z1231 Encounter for screening mammogram for malignant neoplasm of breast: Secondary | ICD-10-CM | POA: Insufficient documentation

## 2023-08-13 ENCOUNTER — Encounter (HOSPITAL_BASED_OUTPATIENT_CLINIC_OR_DEPARTMENT_OTHER): Payer: Self-pay | Admitting: Family

## 2023-09-21 DIAGNOSIS — H40033 Anatomical narrow angle, bilateral: Secondary | ICD-10-CM | POA: Diagnosis not present

## 2023-09-21 DIAGNOSIS — H04123 Dry eye syndrome of bilateral lacrimal glands: Secondary | ICD-10-CM | POA: Diagnosis not present

## 2023-11-08 ENCOUNTER — Ambulatory Visit: Admitting: Orthopedic Surgery

## 2023-11-08 ENCOUNTER — Other Ambulatory Visit (INDEPENDENT_AMBULATORY_CARE_PROVIDER_SITE_OTHER): Payer: Self-pay

## 2023-11-08 DIAGNOSIS — M1711 Unilateral primary osteoarthritis, right knee: Secondary | ICD-10-CM

## 2023-11-08 DIAGNOSIS — M25561 Pain in right knee: Secondary | ICD-10-CM | POA: Diagnosis not present

## 2023-11-09 NOTE — Progress Notes (Unsigned)
 Office Visit Note   Patient: Bethany Kemp           Date of Birth: Nov 08, 1960           MRN: [id number] Visit Date: 11/08/2023 Requested by: Lavona Agent, MD [address] [place],  [place] [id number] PCP: Lavona Agent, MD  Subjective: Chief Complaint  Patient presents with   Right Knee - Pain, Edema    HPI: Bethany Kemp is a 63 y.o. female who presents to the office reporting right knee pain.  Reports increased pain and swelling.  Has a history of prior patellofemoral surgery done about 20 years ago.  Had gel injection in the knee about a year and a half ago.  Not using anything for pain.  The gel did help some.  She reports decreased standing and walking endurance.  Left total knee replacement is doing well..                ROS: All systems reviewed are negative as they relate to the chief complaint within the history of present illness.  Patient denies fevers or chills.  Assessment & Plan: Visit Diagnoses:  1. Arthritis of right knee     Plan: Impression is right knee pain and swelling and patellofemoral arthritis as well as general knee arthritis.  Aspiration and injection performed today in the right knee with cortisone.  Will get her preapproved for gel as well for when this wears off.  She wants to hold off on knee replacement as long as possible.  She will follow-up with us  as needed.  Follow-Up Instructions: No follow-ups on file.   Orders:  Orders Placed This Encounter  Procedures   XR Knee 1-2 Views Right   No orders of the defined types were placed in this encounter.     Procedures: Large Joint Inj: R knee on 11/08/2023 11:10 AM Indications: diagnostic evaluation, joint swelling and pain Details: 18 G 1.5 in needle, superolateral approach  Arthrogram: No  Medications: 5 mL lidocaine  1 %; 4 mL bupivacaine  0.25 %; 40 mg triamcinolone acetonide 40 MG/ML Outcome: tolerated well, no immediate complications Procedure, treatment alternatives, risks  and benefits explained, specific risks discussed. Consent was given by the patient. Immediately prior to procedure a time out was called to verify the correct patient, procedure, equipment, support staff and site/side marked as required. Patient was prepped and draped in the usual sterile fashion.       Clinical Data: No additional findings.  Objective: Vital Signs: LMP 01/13/2015   Physical Exam:  Constitutional: Patient appears well-developed HEENT:  Head: Normocephalic Eyes:EOM are normal Neck: Normal range of motion Cardiovascular: Normal rate Pulmonary/chest: Effort normal Neurologic: Patient is alert Skin: Skin is warm Psychiatric: Patient has normal mood and affect  Ortho Exam: Examination of right knee demonstrates about a 7 to 8 degree flexion contracture.  Patient is able to bend to about 105.  No effusion is present.  Patellofemoral crepitus is present.  Well-healed surgical incision on the anterior aspect of the knee noted.  No masses lymphadenopathy or skin changes noted in the right knee region.  No groin pain with internal or external rotation of the leg.  Specialty Comments:  MRI LUMBAR SPINE WITHOUT CONTRAST   TECHNIQUE: Multiplanar, multisequence MR imaging of the lumbar spine was performed. No intravenous contrast was administered.   COMPARISON:  X-ray 06/11/2021   FINDINGS: Segmentation:  Standard.   Alignment:  3 mm grade 1 anterolisthesis of L4 on L5.  Vertebrae: No fracture, evidence of discitis, or bone lesion. Discogenic endplate marrow changes at L4-5 and L5-S1.   Conus medullaris and cauda equina: Conus extends to the L1 level. Conus and cauda equina appear normal.   Paraspinal and other soft tissues: Negative.   Disc levels:   T12-L1: Unremarkable.   L1-L2: Unremarkable.   L2-L3: Unremarkable.   L3-L4: Minimal annular disc bulge. Mild bilateral facet hypertrophy. No foraminal or canal stenosis.   L4-L5: Anterolisthesis with disc  uncovering and diffuse disc bulge. Mild-moderate bilateral facet arthropathy. Moderate left and mild right foraminal stenosis. No canal stenosis.   L5-S1: Shallow right paracentral disc protrusion. No significant facet arthropathy. Mild bilateral foraminal stenosis. No canal stenosis.   IMPRESSION: 1. Mild lower lumbar spondylosis, most pronounced at L4-5 where there is moderate left and mild right foraminal stenosis. 2. Mild bilateral foraminal stenosis at L5-S1. 3. No canal stenosis at any level.     Electronically Signed   By: Mabel Converse D.O.   On: 06/22/2021 09:56  Imaging: XR Knee 1-2 Views Right Result Date: 11/09/2023 AP lateral radiographs right knee reviewed.  Severe end-stage arthritis is present with mild valgus alignment noted.  Arthritis is most severe in the lateral compartment but nearly as severe in the medial and patellofemoral compartments.  Some loose bodies are present in the posterior aspect of the knee.  No acute fracture    PMFS History: Patient Active Problem List   Diagnosis Date Noted   Overweight 08/29/2020   Essential hypertension 08/29/2020   Murmur 08/29/2020   Arthritis of left knee    S/P total knee arthroplasty, left 07/30/2020   Unilateral primary osteoarthritis, left knee 05/07/2016   Abnormal stress ECG with treadmill 07/30/2014   Palpitation 06/22/2014   Dyspnea 06/22/2014   Past Medical History:  Diagnosis Date   Anemia    Arthritis    Asthma    Complication of anesthesia    Diverticulitis    DVT (deep venous thrombosis) (HCC)    after knee surgery   Family history of adverse reaction to anesthesia    sister had a hard time waking up   Gastritis    1980's was hospitalized   GERD (gastroesophageal reflux disease)    Heart murmur    History of hiatal hernia    Hypertension    Obesities, morbid (HCC)    PONV (postoperative nausea and vomiting)    Pre-diabetes    Sleep apnea    BiPap    Family History  Problem  Relation Age of Onset   Asthma Father    Heart attack Mother 17       Died with MI age 15   Arthritis Mother    Diabetes Mother    Stroke Sister    Breast cancer Sister    Asthma Daughter     Past Surgical History:  Procedure Laterality Date   BREAST REDUCTION SURGERY     BREAST SURGERY     BUNIONECTOMY  01/13/1991   bilateral   CARDIAC CATHETERIZATION N/A 07/30/2014   Procedure: Left Heart Cath and Coronary Angiography;  Surgeon: Ozell Fell, MD;  Location: [facility] INVASIVE CV LAB;  Service: Cardiovascular;  Laterality: N/A;   CHOLECYSTECTOMY  01/13/2003   HERNIA REPAIR  01/13/1980   KNEE SURGERY  01/12/2001   Righ knee -- chondal implant   TOTAL KNEE ARTHROPLASTY Left 07/30/2020   Procedure: LEFT TOTAL KNEE ARTHROPLASTY;  Surgeon: Addie Cordella Hamilton, MD;  Location: [facility] OR;  Service: Orthopedics;  Laterality:  Left;   Social History   Occupational History   Not on file  Tobacco Use   Smoking status: Never   Smokeless tobacco: Never  Vaping Use   Vaping status: Never Used  Substance and Sexual Activity   Alcohol use: No   Drug use: No   Sexual activity: Not on file

## 2023-11-11 ENCOUNTER — Encounter: Payer: Self-pay | Admitting: Orthopedic Surgery

## 2023-11-11 ENCOUNTER — Other Ambulatory Visit: Payer: Self-pay

## 2023-11-11 DIAGNOSIS — M25561 Pain in right knee: Secondary | ICD-10-CM

## 2023-11-11 MED ORDER — BUPIVACAINE HCL 0.25 % IJ SOLN
4.0000 mL | INTRAMUSCULAR | Status: AC | PRN
  Administered 2023-11-08: 4 mL via INTRA_ARTICULAR

## 2023-11-11 MED ORDER — LIDOCAINE HCL 1 % IJ SOLN
5.0000 mL | INTRAMUSCULAR | Status: AC | PRN
  Administered 2023-11-08: 5 mL

## 2023-11-11 MED ORDER — TRIAMCINOLONE ACETONIDE 40 MG/ML IJ SUSP
40.0000 mg | INTRAMUSCULAR | Status: AC | PRN
  Administered 2023-11-08: 40 mg via INTRA_ARTICULAR

## 2023-11-15 ENCOUNTER — Encounter: Payer: Self-pay | Admitting: Radiology

## 2024-01-17 ENCOUNTER — Other Ambulatory Visit: Payer: Self-pay | Admitting: Family

## 2024-01-17 DIAGNOSIS — R921 Mammographic calcification found on diagnostic imaging of breast: Secondary | ICD-10-CM

## 2024-02-07 ENCOUNTER — Encounter

## 2024-02-07 ENCOUNTER — Other Ambulatory Visit

## 2024-02-15 ENCOUNTER — Other Ambulatory Visit

## 2024-02-15 ENCOUNTER — Ambulatory Visit
Admission: RE | Admit: 2024-02-15 | Discharge: 2024-02-15 | Disposition: A | Source: Ambulatory Visit | Attending: Family

## 2024-02-15 DIAGNOSIS — R921 Mammographic calcification found on diagnostic imaging of breast: Secondary | ICD-10-CM
